# Patient Record
Sex: Female | Born: 1945
Health system: Southern US, Community
[De-identification: ages and names within clinical notes are randomized; demographics above are authoritative.]

## PROBLEM LIST (undated history)

## (undated) DIAGNOSIS — M810 Age-related osteoporosis without current pathological fracture: Secondary | ICD-10-CM

## (undated) DIAGNOSIS — S02401A Maxillary fracture, unspecified, initial encounter for closed fracture: Secondary | ICD-10-CM

## (undated) DIAGNOSIS — K219 Gastro-esophageal reflux disease without esophagitis: Secondary | ICD-10-CM

## (undated) DIAGNOSIS — N83209 Unspecified ovarian cyst, unspecified side: Secondary | ICD-10-CM

## (undated) DIAGNOSIS — N801 Endometriosis of ovary: Secondary | ICD-10-CM

## (undated) DIAGNOSIS — Z8742 Personal history of other diseases of the female genital tract: Secondary | ICD-10-CM

## (undated) DIAGNOSIS — G4730 Sleep apnea, unspecified: Secondary | ICD-10-CM

## (undated) DIAGNOSIS — M1990 Unspecified osteoarthritis, unspecified site: Secondary | ICD-10-CM

## (undated) DIAGNOSIS — F988 Other specified behavioral and emotional disorders with onset usually occurring in childhood and adolescence: Secondary | ICD-10-CM

## (undated) DIAGNOSIS — S32509A Unspecified fracture of unspecified pubis, initial encounter for closed fracture: Secondary | ICD-10-CM

## (undated) DIAGNOSIS — Z8679 Personal history of other diseases of the circulatory system: Secondary | ICD-10-CM

## (undated) DIAGNOSIS — S4292XA Fracture of left shoulder girdle, part unspecified, initial encounter for closed fracture: Secondary | ICD-10-CM

## (undated) DIAGNOSIS — I10 Essential (primary) hypertension: Secondary | ICD-10-CM

## (undated) DIAGNOSIS — M069 Rheumatoid arthritis, unspecified: Secondary | ICD-10-CM

## (undated) DIAGNOSIS — Z5189 Encounter for other specified aftercare: Secondary | ICD-10-CM

## (undated) DIAGNOSIS — H269 Unspecified cataract: Secondary | ICD-10-CM

## (undated) DIAGNOSIS — D649 Anemia, unspecified: Secondary | ICD-10-CM

## (undated) DIAGNOSIS — K2970 Gastritis, unspecified, without bleeding: Secondary | ICD-10-CM

## (undated) DIAGNOSIS — I319 Disease of pericardium, unspecified: Secondary | ICD-10-CM

## (undated) DIAGNOSIS — T7840XA Allergy, unspecified, initial encounter: Secondary | ICD-10-CM

## (undated) DIAGNOSIS — Z205 Contact with and (suspected) exposure to viral hepatitis: Secondary | ICD-10-CM

## (undated) DIAGNOSIS — N80109 Endometriosis of ovary, unspecified side, unspecified depth: Secondary | ICD-10-CM

## (undated) HISTORY — DX: Unspecified ovarian cyst, unspecified side: N83.209

## (undated) HISTORY — DX: Contact with and (suspected) exposure to viral hepatitis: Z20.5

## (undated) HISTORY — DX: Personal history of other diseases of the female genital tract: Z87.42

## (undated) HISTORY — DX: Anemia, unspecified: D64.9

## (undated) HISTORY — DX: Unspecified osteoarthritis, unspecified site: M19.90

## (undated) HISTORY — DX: Other specified behavioral and emotional disorders with onset usually occurring in childhood and adolescence: F98.8

## (undated) HISTORY — DX: Rheumatoid arthritis, unspecified: M06.9

## (undated) HISTORY — DX: Endometriosis of ovary, unspecified side, unspecified depth: N80.109

## (undated) HISTORY — DX: Disease of pericardium, unspecified: I31.9

## (undated) HISTORY — DX: Age-related osteoporosis without current pathological fracture: M81.0

## (undated) HISTORY — DX: Encounter for other specified aftercare: Z51.89

## (undated) HISTORY — DX: Sleep apnea, unspecified: G47.30

## (undated) HISTORY — DX: Fracture of left shoulder girdle, part unspecified, initial encounter for closed fracture: S42.92XA

## (undated) HISTORY — DX: Unspecified fracture of unspecified pubis, initial encounter for closed fracture: S32.509A

## (undated) HISTORY — PX: APPENDECTOMY: SHX54

## (undated) HISTORY — PX: ANTERIOR CRUCIATE LIGAMENT REPAIR: SHX115

## (undated) HISTORY — DX: Personal history of other diseases of the circulatory system: Z86.79

## (undated) HISTORY — DX: Maxillary fracture, unspecified side, initial encounter for closed fracture: S02.401A

## (undated) HISTORY — DX: Essential (primary) hypertension: I10

## (undated) HISTORY — PX: OOPHORECTOMY: SHX86

## (undated) HISTORY — DX: Unspecified cataract: H26.9

## (undated) HISTORY — DX: Endometriosis of ovary: N80.1

## (undated) HISTORY — DX: Allergy, unspecified, initial encounter: T78.40XA

## (undated) HISTORY — PX: ABDOMINAL HYSTERECTOMY: SHX81

## (undated) HISTORY — PX: CARPAL TUNNEL RELEASE: SHX101

## (undated) HISTORY — DX: Gastritis, unspecified, without bleeding: K29.70

## (undated) HISTORY — DX: Gastro-esophageal reflux disease without esophagitis: K21.9

---
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
## 1979-03-13 DIAGNOSIS — S4292XA Fracture of left shoulder girdle, part unspecified, initial encounter for closed fracture: Secondary | ICD-10-CM

## 1979-03-13 DIAGNOSIS — S32509A Unspecified fracture of unspecified pubis, initial encounter for closed fracture: Secondary | ICD-10-CM

## 1979-03-13 DIAGNOSIS — S02401A Maxillary fracture, unspecified, initial encounter for closed fracture: Secondary | ICD-10-CM

## 1979-03-13 HISTORY — DX: Unspecified fracture of unspecified pubis, initial encounter for closed fracture: S32.509A

## 1979-03-13 HISTORY — DX: Maxillary fracture, unspecified side, initial encounter for closed fracture: S02.401A

## 1979-03-13 HISTORY — DX: Fracture of left shoulder girdle, part unspecified, initial encounter for closed fracture: S42.92XA

## 1986-03-12 DIAGNOSIS — Z8679 Personal history of other diseases of the circulatory system: Secondary | ICD-10-CM

## 1986-03-12 HISTORY — DX: Personal history of other diseases of the circulatory system: Z86.79

## 1997-03-12 HISTORY — PX: ARTHROSCOPIC REPAIR ACL: SUR80

## 1998-03-12 DIAGNOSIS — M069 Rheumatoid arthritis, unspecified: Secondary | ICD-10-CM

## 1998-03-12 HISTORY — DX: Rheumatoid arthritis, unspecified: M06.9

## 2003-01-10 LAB — HM COLONOSCOPY

## 2003-03-13 HISTORY — PX: EYE SURGERY: SHX253

## 2004-03-12 HISTORY — PX: SEPTOPLASTY: SUR1290

## 2005-02-09 HISTORY — PX: OTHER SURGICAL HISTORY: SHX169

## 2005-03-01 ENCOUNTER — Ambulatory Visit: Payer: Self-pay | Admitting: Family Medicine

## 2005-03-08 ENCOUNTER — Ambulatory Visit: Payer: Self-pay | Admitting: Otolaryngology

## 2005-03-27 ENCOUNTER — Ambulatory Visit: Payer: Self-pay

## 2005-05-28 ENCOUNTER — Ambulatory Visit: Payer: Self-pay | Admitting: Pain Medicine

## 2005-06-05 ENCOUNTER — Encounter: Payer: Self-pay | Admitting: Pain Medicine

## 2005-06-10 ENCOUNTER — Encounter: Payer: Self-pay | Admitting: Pain Medicine

## 2005-06-18 ENCOUNTER — Ambulatory Visit: Payer: Self-pay | Admitting: Pain Medicine

## 2005-07-10 ENCOUNTER — Encounter: Payer: Self-pay | Admitting: Pain Medicine

## 2005-09-23 ENCOUNTER — Emergency Department: Payer: Self-pay | Admitting: Emergency Medicine

## 2006-05-07 ENCOUNTER — Ambulatory Visit: Payer: Self-pay | Admitting: Unknown Physician Specialty

## 2008-05-07 ENCOUNTER — Ambulatory Visit: Payer: Self-pay | Admitting: Internal Medicine

## 2008-09-02 ENCOUNTER — Ambulatory Visit: Payer: Self-pay | Admitting: Internal Medicine

## 2010-08-10 LAB — HM MAMMOGRAPHY

## 2010-09-21 ENCOUNTER — Ambulatory Visit: Payer: Self-pay | Admitting: Internal Medicine

## 2011-01-08 ENCOUNTER — Other Ambulatory Visit: Payer: Self-pay | Admitting: Internal Medicine

## 2011-01-08 NOTE — Telephone Encounter (Signed)
[phone] Pt called to get her addrell  20 mg 2 x day last refill end sept

## 2011-01-08 NOTE — Telephone Encounter (Signed)
The adderall cannot be refilled until Nov 3 You can phone it in but not to refill before nov 3

## 2011-01-08 NOTE — Telephone Encounter (Signed)
I called [facility] and asked when it was last refilled, the pharmacist stated Oct. 4, 2012.   Patient told me she had it refilled at the end of Sept.  Please advise.

## 2011-01-09 MED ORDER — AMPHETAMINE-DEXTROAMPHETAMINE 20 MG PO TABS: 20.0000 mg | ORAL_TABLET | Freq: Two times a day (BID) | ORAL | Status: DC

## 2011-01-09 NOTE — Telephone Encounter (Signed)
I have printed the refill for you to sign with a note stating do not refill until Nov 3

## 2011-01-18 ENCOUNTER — Encounter: Payer: Self-pay | Admitting: Internal Medicine

## 2011-01-19 ENCOUNTER — Ambulatory Visit: Payer: Self-pay | Admitting: Internal Medicine

## 2011-01-22 ENCOUNTER — Other Ambulatory Visit: Payer: Self-pay | Admitting: Internal Medicine

## 2011-01-23 MED ORDER — TRAMADOL HCL 50 MG PO TABS: 50.0000 mg | ORAL_TABLET | Freq: Two times a day (BID) | ORAL | Status: DC | PRN

## 2011-02-12 ENCOUNTER — Encounter: Payer: Self-pay | Admitting: Internal Medicine

## 2011-02-12 ENCOUNTER — Ambulatory Visit (INDEPENDENT_AMBULATORY_CARE_PROVIDER_SITE_OTHER): Payer: Medicare Other | Admitting: Internal Medicine

## 2011-02-12 ENCOUNTER — Ambulatory Visit: Payer: Self-pay | Admitting: Internal Medicine

## 2011-02-12 DIAGNOSIS — M1990 Unspecified osteoarthritis, unspecified site: Secondary | ICD-10-CM

## 2011-02-12 DIAGNOSIS — Z13820 Encounter for screening for osteoporosis: Secondary | ICD-10-CM

## 2011-02-12 DIAGNOSIS — F909 Attention-deficit hyperactivity disorder, unspecified type: Secondary | ICD-10-CM

## 2011-02-12 DIAGNOSIS — Z23 Encounter for immunization: Secondary | ICD-10-CM

## 2011-02-12 DIAGNOSIS — I10 Essential (primary) hypertension: Secondary | ICD-10-CM

## 2011-02-12 MED ORDER — AMPHETAMINE-DEXTROAMPHETAMINE 20 MG PO TABS: 20.0000 mg | ORAL_TABLET | Freq: Two times a day (BID) | ORAL | Status: DC

## 2011-02-12 MED ORDER — NABUMETONE 750 MG PO TABS: 750.0000 mg | ORAL_TABLET | Freq: Two times a day (BID) | ORAL | Status: DC

## 2011-02-12 MED ORDER — AMLODIPINE BESYLATE 2.5 MG PO TABS: 2.5000 mg | ORAL_TABLET | Freq: Every day | ORAL | Status: DC

## 2011-02-12 MED ORDER — HYDROCODONE-ACETAMINOPHEN 7.5-750 MG PO TABS: 1.0000 | ORAL_TABLET | Freq: Every evening | ORAL | Status: DC | PRN
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
## 2011-02-12 NOTE — Patient Instructions (Signed)
Stop the meloxicam and try nabumetone twice daily for your antiinflammatory  continue 2 tramadol up to 6 daily.  1 or 2 ES vicodin  Daily for severe pain .[name] Kitchen   DEXA to repeated.

## 2011-02-13 ENCOUNTER — Encounter: Payer: Self-pay | Admitting: Internal Medicine

## 2011-02-13 DIAGNOSIS — S32509A Unspecified fracture of unspecified pubis, initial encounter for closed fracture: Secondary | ICD-10-CM | POA: Insufficient documentation

## 2011-02-13 DIAGNOSIS — N801 Endometriosis of ovary: Secondary | ICD-10-CM | POA: Insufficient documentation

## 2011-02-13 DIAGNOSIS — N80109 Endometriosis of ovary, unspecified side, unspecified depth: Secondary | ICD-10-CM | POA: Insufficient documentation

## 2011-02-13 DIAGNOSIS — Z13820 Encounter for screening for osteoporosis: Secondary | ICD-10-CM | POA: Insufficient documentation

## 2011-02-13 DIAGNOSIS — Z8742 Personal history of other diseases of the female genital tract: Secondary | ICD-10-CM | POA: Insufficient documentation

## 2011-02-13 DIAGNOSIS — S02401A Maxillary fracture, unspecified, initial encounter for closed fracture: Secondary | ICD-10-CM | POA: Insufficient documentation

## 2011-02-13 DIAGNOSIS — F909 Attention-deficit hyperactivity disorder, unspecified type: Secondary | ICD-10-CM | POA: Insufficient documentation

## 2011-02-13 DIAGNOSIS — S4292XA Fracture of left shoulder girdle, part unspecified, initial encounter for closed fracture: Secondary | ICD-10-CM | POA: Insufficient documentation

## 2011-02-13 NOTE — Assessment & Plan Note (Signed)
Last DEXA was done in [place] 4 years ago.  She is 65 and at risk given lean frame, prior tobacco use.  DEXA ordered.

## 2011-02-13 NOTE — Assessment & Plan Note (Signed)
Secondary to remote history of traumatic fractures of pelvis, coccyx, shoulder and jaw.  She has incomplete relief of pain with current NSAIDs.  Trial of relafen and PM vicodin,  If pain escalates will repeat serologiesfor RA given patient's report of positive testing in 2000.

## 2011-02-13 NOTE — Assessment & Plan Note (Signed)
Managed with adderall, stable doses,  Refills for 3 months given.

## 2011-02-22 ENCOUNTER — Other Ambulatory Visit: Payer: Self-pay | Admitting: Internal Medicine

## 2011-02-22 MED ORDER — MONTELUKAST SODIUM 10 MG PO TABS: 10.0000 mg | ORAL_TABLET | Freq: Every day | ORAL | Status: DC

## 2011-02-23 ENCOUNTER — Other Ambulatory Visit: Payer: Self-pay | Admitting: Internal Medicine

## 2011-02-23 NOTE — Telephone Encounter (Signed)
Refills on all 5 authorized.  #3 refills each

## 2011-02-26 ENCOUNTER — Other Ambulatory Visit: Payer: Self-pay | Admitting: Internal Medicine

## 2011-02-26 MED ORDER — MELOXICAM 15 MG PO TABS: 15.0000 mg | ORAL_TABLET | Freq: Every day | ORAL | Status: DC

## 2011-02-26 NOTE — Telephone Encounter (Signed)
[phone] Pt called to check on her rx.  This was to be called in Friday  [facility] in [place] Pt went by to pick up rx and [facility] had not received them yesterday afternoon.  [facility] was going to request refill yesterday Please advise pt when this is called in

## 2011-02-27 MED ORDER — FUROSEMIDE 20 MG PO TABS: 20.0000 mg | ORAL_TABLET | Freq: Every day | ORAL | Status: DC | PRN

## 2011-02-27 MED ORDER — ZOLPIDEM TARTRATE 10 MG PO TABS: 10.0000 mg | ORAL_TABLET | Freq: Every evening | ORAL | Status: DC | PRN

## 2011-02-27 MED ORDER — TRAMADOL HCL 50 MG PO TABS
50.0000 mg | ORAL_TABLET | Freq: Two times a day (BID) | ORAL | Status: DC | PRN
Start: 2011-02-23 — End: 2011-12-24

## 2011-02-27 MED ORDER — BENZONATATE 200 MG PO CAPS: 200.0000 mg | ORAL_CAPSULE | Freq: Three times a day (TID) | ORAL | Status: DC | PRN

## 2011-02-27 MED ORDER — ALPRAZOLAM 0.5 MG PO TABS: 0.5000 mg | ORAL_TABLET | Freq: Every evening | ORAL | Status: DC | PRN

## 2011-02-27 NOTE — Telephone Encounter (Signed)
Left message asking patient to return my call if she needs something other than the meloxicam because I'm not sure what med she needs refilled. Other than the meloxicam which we have already filled.

## 2011-02-27 NOTE — Telephone Encounter (Signed)
Rx called to pharmacy. Patient notified. She also wanted me to let you know that the Relefen is going to cost her $40.00 and she can't afford it, she is asking if you would want her to try something else or go back to the meloxicam.

## 2011-02-28 NOTE — Telephone Encounter (Signed)
If the meloxicam was helpful before , go back to it  ,  If not,  We could try diclofenac 75 mg twice daily prn pain #60

## 2011-04-12 DIAGNOSIS — H2510 Age-related nuclear cataract, unspecified eye: Secondary | ICD-10-CM | POA: Diagnosis not present

## 2011-04-17 ENCOUNTER — Encounter: Payer: Self-pay | Admitting: Internal Medicine

## 2011-04-26 DIAGNOSIS — H18519 Endothelial corneal dystrophy, unspecified eye: Secondary | ICD-10-CM | POA: Diagnosis not present

## 2011-04-26 DIAGNOSIS — H2510 Age-related nuclear cataract, unspecified eye: Secondary | ICD-10-CM | POA: Diagnosis not present

## 2011-05-08 ENCOUNTER — Other Ambulatory Visit: Payer: Self-pay | Admitting: Internal Medicine

## 2011-05-08 MED ORDER — AMPHETAMINE-DEXTROAMPHETAMINE 20 MG PO TABS: 20.0000 mg | ORAL_TABLET | Freq: Two times a day (BID) | ORAL | Status: DC

## 2011-05-11 ENCOUNTER — Ambulatory Visit: Payer: Self-pay | Admitting: Internal Medicine

## 2011-05-11 ENCOUNTER — Telehealth: Payer: Self-pay | Admitting: Internal Medicine

## 2011-05-11 ENCOUNTER — Ambulatory Visit (INDEPENDENT_AMBULATORY_CARE_PROVIDER_SITE_OTHER): Payer: Medicare Other | Admitting: Internal Medicine

## 2011-05-11 ENCOUNTER — Encounter: Payer: Self-pay | Admitting: Internal Medicine

## 2011-05-11 VITALS — BP 116/72 | HR 80 | Temp 98.4°F | Resp 16 | Ht 63.5 in | Wt 116.5 lb

## 2011-05-11 DIAGNOSIS — J441 Chronic obstructive pulmonary disease with (acute) exacerbation: Secondary | ICD-10-CM

## 2011-05-11 DIAGNOSIS — J449 Chronic obstructive pulmonary disease, unspecified: Secondary | ICD-10-CM | POA: Diagnosis not present

## 2011-05-11 DIAGNOSIS — F17200 Nicotine dependence, unspecified, uncomplicated: Secondary | ICD-10-CM | POA: Diagnosis not present

## 2011-05-11 HISTORY — PX: CATARACT EXTRACTION: SUR2

## 2011-05-11 MED ORDER — PREDNISONE (PAK) 10 MG PO TABS: ORAL_TABLET | ORAL | Status: AC

## 2011-05-11 MED ORDER — AMOXICILLIN-POT CLAVULANATE 875-125 MG PO TABS: 1.0000 | ORAL_TABLET | Freq: Two times a day (BID) | ORAL | Status: AC

## 2011-05-11 MED ORDER — FLUCONAZOLE 150 MG PO TABS: 150.0000 mg | ORAL_TABLET | Freq: Every day | ORAL | Status: AC

## 2011-05-11 MED ORDER — GUAIFENESIN-CODEINE 100-10 MG/5ML PO SOLN: 10.0000 mL | Freq: Three times a day (TID) | ORAL | Status: AC | PRN

## 2011-05-11 MED ORDER — ALBUTEROL SULFATE HFA 108 (90 BASE) MCG/ACT IN AERS: 2.0000 | INHALATION_SPRAY | Freq: Four times a day (QID) | RESPIRATORY_TRACT | Status: DC | PRN

## 2011-05-11 NOTE — Progress Notes (Signed)
Subjective:    Patient ID: Sonya Davis, female    DOB: 1945/04/05, 66 y.o.   MRN: [id number]  HPI 66 yr old white female with history of prior tobacco abuse presents with acute on chronic cough.  Her cough started in December, was initially productive,  then became dry and hacking  For the past month. now productive again with yellow tinged sputum accompanied by malaise, tenderness over her maxillary sinuses.  Using robitussin and simply saline twice daily , prior trial of  steroid nasal spray have caused bleeding .  2nd issue is planning to have right eye cataract removed.  Past Medical History  Diagnosis Date  . Gastritis   . 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 Rheumatoid arthritis 2000    per patient by labs  . Ovarian cyst     right, endometriosis  . ADD (attention deficit disorder)   . Fracture of bone of left shoulder 1981  . Fracture closed, pubis 1981  . Fracture of maxilla 1981  . History of ovarian cyst   . Endometriosis of ovary     prior GYN [name]   Current Outpatient Prescriptions on File Prior to Visit  Medication Sig Dispense Refill  . ALPRAZolam (XANAX) 0.5 MG tablet Take 1 tablet (0.5 mg total) by mouth at bedtime as needed.  30 tablet  3  . amLODipine (NORVASC) 2.5 MG tablet Take 1 tablet (2.5 mg total) by mouth daily.  30 tablet  11  . amphetamine-dextroamphetamine (ADDERALL, 20MG ,) 20 MG tablet Take 1 tablet (20 mg total) by mouth 2 (two) times daily.  60 tablet  0  . benzonatate (TESSALON) 200 MG capsule Take 1 capsule (200 mg total) by mouth 3 (three) times daily as needed.  20 capsule  3  . calcium carbonate (OS-CAL) 600 MG TABS Take 600 mg by mouth 2 (two) times daily with a meal.        . cetirizine (ZYRTEC) 10 MG tablet Take 10 mg by mouth daily.        . cholecalciferol (VITAMIN D) 1000 UNITS tablet Take 1,000 Units by mouth daily.        Tery Sanfilippo Calcium (STOOL SOFTENER PO) Take  by mouth.        . furosemide (LASIX) 20 MG tablet Take 1 tablet (20 mg total) by mouth daily as needed.  30 tablet  3  . HYDROcodone-acetaminophen (VICODIN ES) 7.5-750 MG per tablet Take 1 tablet by mouth at bedtime as needed and may repeat dose one time if needed for pain.  60 tablet  3  . meloxicam (MOBIC) 15 MG tablet Take 1 tablet (15 mg total) by mouth daily.  30 tablet  6  . montelukast (SINGULAIR) 10 MG tablet Take 1 tablet (10 mg total) by mouth daily.  30 tablet  5  . traMADol (ULTRAM) 50 MG tablet Take 1 tablet (50 mg total) by mouth 2 (two) times daily as needed. Maximum dose= 8 tablets per day  60 tablet  3  . zolpidem (AMBIEN) 10 MG tablet Take 1 tablet (10 mg total) by mouth at bedtime as needed.  30 tablet  3     Review of Systems  Constitutional: Positive for activity change. Negative for fever, chills and unexpected weight change.  HENT: Positive for nosebleeds, congestion, rhinorrhea and postnasal drip. Negative for hearing loss, ear pain, sore throat, facial swelling, sneezing, mouth sores, trouble swallowing,  neck pain, neck stiffness, voice change, sinus pressure, tinnitus and ear discharge.   Eyes: Negative for pain, discharge, redness and visual disturbance.  Respiratory: Positive for choking and wheezing. Negative for cough, chest tightness, shortness of breath and stridor.   Cardiovascular: Negative for chest pain, palpitations and leg swelling.  Musculoskeletal: Negative for myalgias and arthralgias.  Skin: Negative for color change and rash.  Neurological: Negative for dizziness, weakness, light-headedness and headaches.  Hematological: Negative for adenopathy.       Objective:   Physical Exam  Constitutional: She is oriented to person, place, and time. She appears well-developed and well-nourished.  HENT:  Nose: Right sinus exhibits maxillary sinus tenderness. Left sinus exhibits maxillary sinus tenderness.  Mouth/Throat: Oropharynx is clear and moist.  Eyes:  EOM are normal. Pupils are equal, round, and reactive to light. No scleral icterus.  Neck: Normal range of motion. Neck supple. No JVD present. No thyromegaly present.  Cardiovascular: Normal rate, regular rhythm, normal heart sounds and intact distal pulses.   Pulmonary/Chest: Effort normal. She has wheezes.  Abdominal: Soft. Bowel sounds are normal. She exhibits no mass. There is no tenderness.  Musculoskeletal: Normal range of motion. She exhibits no edema.  Lymphadenopathy:    She has no cervical adenopathy.  Neurological: She is alert and oriented to person, place, and time.  Skin: Skin is warm and dry.  Psychiatric: She has a normal mood and affect.      Assessment & Plan:   COPD with acute exacerbation Mild.  She was given a nebulizer treatment in office with albuterol, which she tolerated well. Will treat with steroids,nchodilators, antibiotics.     Updated Medication List Outpatient Encounter Prescriptions as of 05/11/2011  Medication Sig Dispense Refill  . ALPRAZolam (XANAX) 0.5 MG tablet Take 1 tablet (0.5 mg total) by mouth at bedtime as needed.  30 tablet  3  . amLODipine (NORVASC) 2.5 MG tablet Take 1 tablet (2.5 mg total) by mouth daily.  30 tablet  11  . amphetamine-dextroamphetamine (ADDERALL, 20MG ,) 20 MG tablet Take 1 tablet (20 mg total) by mouth 2 (two) times daily.  60 tablet  0  . benzonatate (TESSALON) 200 MG capsule Take 1 capsule (200 mg total) by mouth 3 (three) times daily as needed.  20 capsule  3  . calcium carbonate (OS-CAL) 600 MG TABS Take 600 mg by mouth 2 (two) times daily with a meal.        . cetirizine (ZYRTEC) 10 MG tablet Take 10 mg by mouth daily.        . cholecalciferol (VITAMIN D) 1000 UNITS tablet Take 1,000 Units by mouth daily.        Tery Sanfilippo Calcium (STOOL SOFTENER PO) Take by mouth.        . furosemide (LASIX) 20 MG tablet Take 1 tablet (20 mg total) by mouth daily as needed.  30 tablet  3  . HYDROcodone-acetaminophen (VICODIN ES)  7.5-750 MG per tablet Take 1 tablet by mouth at bedtime as needed and may repeat dose one time if needed for pain.  60 tablet  3  . meloxicam (MOBIC) 15 MG tablet Take 1 tablet (15 mg total) by mouth daily.  30 tablet  6  . montelukast (SINGULAIR) 10 MG tablet Take 1 tablet (10 mg total) by mouth daily.  30 tablet  5  . traMADol (ULTRAM) 50 MG tablet Take 1 tablet (50 mg total) by mouth 2 (two) times daily as needed. Maximum dose= 8 tablets per day  60 tablet  3  . zolpidem (AMBIEN) 10 MG tablet Take 1 tablet (10 mg total) by mouth at bedtime as needed.  30 tablet  3  . albuterol (PROVENTIL HFA;VENTOLIN HFA) 108 (90 BASE) MCG/ACT inhaler Inhale 2 puffs into the lungs every 6 (six) hours as needed for wheezing.  1 Inhaler  0  . amoxicillin-clavulanate (AUGMENTIN) 875-125 MG per tablet Take 1 tablet by mouth 2 (two) times daily.  14 tablet  0  . fluconazole (DIFLUCAN) 150 MG tablet Take 1 tablet (150 mg total) by mouth daily.  2 tablet  0  . guaiFENesin-codeine 100-10 MG/5ML syrup Take 10 mLs by mouth 3 (three) times daily as needed for cough.  180 mL  0  . predniSONE (STERAPRED UNI-PAK) 10 MG tablet 6 tablets on Day 1 , then reduce by 1 tablet daily until gone  21 tablet  0  . DISCONTD: fluticasone (FLONASE) 50 MCG/ACT nasal spray Place 2 sprays into the nose daily.        [name] Kitchen DISCONTD: nabumetone (RELAFEN) 750 MG tablet Take 1 tablet (750 mg total) by mouth 2 (two) times daily.  60 tablet  5

## 2011-05-11 NOTE — Telephone Encounter (Signed)
[phone] PT CALLED CHECKING O HER XRAY RESULTS.

## 2011-05-11 NOTE — Patient Instructions (Signed)
I am treating your for a COPD exacerbation since your are wheezing.   Prednisone ,  Albuterol inhaler,  augmentin for infection including any lingering sinus infection/.  Fluconazole for the anticipated yeast infection  Continue using Simply Saline twice daily for the rest of the season to flush your sinuses  I have printed out a cough medicine with codeine in it if you need something stronger than robitussin

## 2011-05-11 NOTE — Telephone Encounter (Signed)
Patient notified of xray results, Per Dr. Darrick Huntsman there are no signs of pneumonia and xray did suggest copd which Dr. Darrick Huntsman is treating her for. Copy of xray sent to be scanned.

## 2011-05-13 ENCOUNTER — Encounter: Payer: Self-pay | Admitting: Internal Medicine

## 2011-05-13 DIAGNOSIS — J441 Chronic obstructive pulmonary disease with (acute) exacerbation: Secondary | ICD-10-CM | POA: Insufficient documentation

## 2011-05-13 NOTE — Assessment & Plan Note (Addendum)
Mild.  She was given a nebulizer treatment in office with albuterol, which she tolerated well. Will treat with steroids,nchodilators, antibiotics.

## 2011-05-21 ENCOUNTER — Encounter: Payer: Self-pay | Admitting: Internal Medicine

## 2011-05-23 ENCOUNTER — Ambulatory Visit: Payer: Self-pay | Admitting: Ophthalmology

## 2011-05-23 DIAGNOSIS — Z01810 Encounter for preprocedural cardiovascular examination: Secondary | ICD-10-CM | POA: Diagnosis not present

## 2011-05-23 DIAGNOSIS — I10 Essential (primary) hypertension: Secondary | ICD-10-CM | POA: Diagnosis not present

## 2011-05-23 DIAGNOSIS — Z01812 Encounter for preprocedural laboratory examination: Secondary | ICD-10-CM | POA: Diagnosis not present

## 2011-05-23 DIAGNOSIS — H2510 Age-related nuclear cataract, unspecified eye: Secondary | ICD-10-CM | POA: Diagnosis not present

## 2011-05-23 LAB — POTASSIUM: Potassium: 3.5 mmol/L (ref 3.5–5.1)

## 2011-05-31 ENCOUNTER — Encounter: Payer: Self-pay | Admitting: Internal Medicine

## 2011-05-31 ENCOUNTER — Telehealth: Payer: Self-pay | Admitting: Internal Medicine

## 2011-05-31 DIAGNOSIS — R9431 Abnormal electrocardiogram [ECG] [EKG]: Secondary | ICD-10-CM

## 2011-05-31 NOTE — Telephone Encounter (Signed)
Her preoperative EKG done prior to cataract surgery suggested possible RBBB (a conduction delay) .  We should repeat her EKk next month after her surgery and if the changes are persistent,. Refer to Cardiology for evaluation.

## 2011-05-31 NOTE — Assessment & Plan Note (Signed)
Her preoperative EKG done prior to cataract surgery suggested possible RBBB (a conduction delay) .  We will repeat here after her suegery and if persistent,. Refer to Cardiology for evaluation.

## 2011-06-01 NOTE — Telephone Encounter (Signed)
Patient notified. She will call back to get this scheduled.

## 2011-06-01 NOTE — Telephone Encounter (Signed)
Left message asking patient to return my call.

## 2011-06-04 ENCOUNTER — Ambulatory Visit: Payer: Self-pay | Admitting: Ophthalmology

## 2011-06-04 DIAGNOSIS — Z8619 Personal history of other infectious and parasitic diseases: Secondary | ICD-10-CM | POA: Diagnosis not present

## 2011-06-04 DIAGNOSIS — R059 Cough, unspecified: Secondary | ICD-10-CM | POA: Diagnosis not present

## 2011-06-04 DIAGNOSIS — K589 Irritable bowel syndrome without diarrhea: Secondary | ICD-10-CM | POA: Diagnosis not present

## 2011-06-04 DIAGNOSIS — K219 Gastro-esophageal reflux disease without esophagitis: Secondary | ICD-10-CM | POA: Diagnosis not present

## 2011-06-04 DIAGNOSIS — J449 Chronic obstructive pulmonary disease, unspecified: Secondary | ICD-10-CM | POA: Diagnosis not present

## 2011-06-04 DIAGNOSIS — H2510 Age-related nuclear cataract, unspecified eye: Secondary | ICD-10-CM | POA: Diagnosis not present

## 2011-06-04 DIAGNOSIS — H269 Unspecified cataract: Secondary | ICD-10-CM | POA: Diagnosis not present

## 2011-06-04 DIAGNOSIS — R002 Palpitations: Secondary | ICD-10-CM | POA: Diagnosis not present

## 2011-06-04 DIAGNOSIS — M899 Disorder of bone, unspecified: Secondary | ICD-10-CM | POA: Diagnosis not present

## 2011-06-04 DIAGNOSIS — M069 Rheumatoid arthritis, unspecified: Secondary | ICD-10-CM | POA: Diagnosis not present

## 2011-06-04 DIAGNOSIS — Z79899 Other long term (current) drug therapy: Secondary | ICD-10-CM | POA: Diagnosis not present

## 2011-06-04 DIAGNOSIS — K2970 Gastritis, unspecified, without bleeding: Secondary | ICD-10-CM | POA: Diagnosis not present

## 2011-06-04 DIAGNOSIS — F909 Attention-deficit hyperactivity disorder, unspecified type: Secondary | ICD-10-CM | POA: Diagnosis not present

## 2011-06-04 DIAGNOSIS — K449 Diaphragmatic hernia without obstruction or gangrene: Secondary | ICD-10-CM | POA: Diagnosis not present

## 2011-06-07 ENCOUNTER — Other Ambulatory Visit: Payer: Self-pay | Admitting: Internal Medicine

## 2011-06-07 MED ORDER — HYDROCODONE-ACETAMINOPHEN 7.5-750 MG PO TABS: 1.0000 | ORAL_TABLET | Freq: Every evening | ORAL | Status: DC | PRN

## 2011-06-12 ENCOUNTER — Encounter: Payer: Self-pay | Admitting: Internal Medicine

## 2011-07-02 ENCOUNTER — Other Ambulatory Visit: Payer: Self-pay | Admitting: Internal Medicine

## 2011-07-02 MED ORDER — ALPRAZOLAM 0.5 MG PO TABS: 0.5000 mg | ORAL_TABLET | Freq: Every evening | ORAL | Status: DC | PRN

## 2011-07-02 MED ORDER — FUROSEMIDE 20 MG PO TABS: 20.0000 mg | ORAL_TABLET | Freq: Every day | ORAL | Status: DC | PRN

## 2011-07-02 MED ORDER — ZOLPIDEM TARTRATE 10 MG PO TABS: 10.0000 mg | ORAL_TABLET | Freq: Every evening | ORAL | Status: DC | PRN

## 2011-07-02 MED ORDER — BENZONATATE 200 MG PO CAPS: 200.0000 mg | ORAL_CAPSULE | Freq: Three times a day (TID) | ORAL | Status: DC | PRN

## 2011-07-03 ENCOUNTER — Other Ambulatory Visit: Payer: Self-pay | Admitting: Internal Medicine

## 2011-08-10 ENCOUNTER — Telehealth: Payer: Self-pay | Admitting: Internal Medicine

## 2011-08-10 MED ORDER — AMPHETAMINE-DEXTROAMPHETAMINE 20 MG PO TABS: 20.0000 mg | ORAL_TABLET | Freq: Two times a day (BID) | ORAL | Status: DC

## 2011-08-10 NOTE — Telephone Encounter (Signed)
Refill on Adderall 20 mg. °

## 2011-09-18 ENCOUNTER — Other Ambulatory Visit: Payer: Self-pay | Admitting: Internal Medicine

## 2011-09-18 NOTE — Telephone Encounter (Signed)
Patient needs adderall Rx refilled. Pls contact pt when it's ready to pickup. Thanks

## 2011-09-19 MED ORDER — AMPHETAMINE-DEXTROAMPHETAMINE 20 MG PO TABS: 20.0000 mg | ORAL_TABLET | Freq: Two times a day (BID) | ORAL | Status: DC

## 2011-09-20 NOTE — Telephone Encounter (Signed)
Patient notified that rx is ready ° °

## 2011-10-10 DIAGNOSIS — M2010 Hallux valgus (acquired), unspecified foot: Secondary | ICD-10-CM | POA: Diagnosis not present

## 2011-10-10 DIAGNOSIS — L608 Other nail disorders: Secondary | ICD-10-CM | POA: Diagnosis not present

## 2011-10-18 ENCOUNTER — Telehealth: Payer: Self-pay | Admitting: Internal Medicine

## 2011-10-18 MED ORDER — AMPHETAMINE-DEXTROAMPHETAMINE 20 MG PO TABS: 20.0000 mg | ORAL_TABLET | Freq: Two times a day (BID) | ORAL | Status: DC

## 2011-10-18 NOTE — Telephone Encounter (Signed)
Pt called to get her addreall 20mg  refilled    will run out this weekend. Please advise pt when ready to pick up

## 2011-10-22 ENCOUNTER — Other Ambulatory Visit: Payer: Self-pay | Admitting: Internal Medicine

## 2011-10-22 MED ORDER — AMPHETAMINE-DEXTROAMPHETAMINE 20 MG PO TABS: 20.0000 mg | ORAL_TABLET | Freq: Two times a day (BID) | ORAL | Status: DC

## 2011-11-09 DIAGNOSIS — H532 Diplopia: Secondary | ICD-10-CM | POA: Diagnosis not present

## 2011-11-19 ENCOUNTER — Other Ambulatory Visit: Payer: Self-pay | Admitting: Internal Medicine

## 2011-11-19 NOTE — Telephone Encounter (Signed)
Patient needing refills

## 2011-11-20 DIAGNOSIS — H532 Diplopia: Secondary | ICD-10-CM | POA: Diagnosis not present

## 2011-11-21 MED ORDER — HYDROCODONE-ACETAMINOPHEN 7.5-750 MG PO TABS: 1.0000 | ORAL_TABLET | Freq: Every evening | ORAL | Status: DC | PRN

## 2011-11-21 MED ORDER — AMPHETAMINE-DEXTROAMPHETAMINE 20 MG PO TABS: 20.0000 mg | ORAL_TABLET | Freq: Two times a day (BID) | ORAL | Status: DC

## 2011-12-19 ENCOUNTER — Other Ambulatory Visit: Payer: Self-pay | Admitting: Internal Medicine

## 2011-12-19 NOTE — Telephone Encounter (Signed)
Pt is needing refill on Adderall. She needs  Furroscmide, Tramadol, and Alprazolam.

## 2011-12-21 NOTE — Telephone Encounter (Signed)
Pt is calling back about refills. She says she hasn't received a call yet. I told her i would send the note again to get her meds refilled.

## 2011-12-24 MED ORDER — FUROSEMIDE 20 MG PO TABS: 20.0000 mg | ORAL_TABLET | Freq: Every day | ORAL | Status: DC | PRN

## 2011-12-25 MED ORDER — TRAMADOL HCL 50 MG PO TABS: 50.0000 mg | ORAL_TABLET | Freq: Two times a day (BID) | ORAL | Status: DC | PRN

## 2011-12-25 MED ORDER — FUROSEMIDE 20 MG PO TABS: 20.0000 mg | ORAL_TABLET | Freq: Every day | ORAL | Status: DC | PRN

## 2011-12-25 MED ORDER — ALPRAZOLAM 0.5 MG PO TABS: 0.5000 mg | ORAL_TABLET | Freq: Every evening | ORAL | Status: DC | PRN

## 2011-12-25 MED ORDER — AMPHETAMINE-DEXTROAMPHETAMINE 20 MG PO TABS: 20.0000 mg | ORAL_TABLET | Freq: Two times a day (BID) | ORAL | Status: DC

## 2011-12-25 NOTE — Telephone Encounter (Signed)
4 meds refilled,   2 were controlled usbstance., other 2 sent

## 2011-12-25 NOTE — Telephone Encounter (Signed)
Patient was notified via phone to pick up Rx here at the office.

## 2012-01-10 ENCOUNTER — Encounter: Payer: Self-pay | Admitting: Internal Medicine

## 2012-01-10 ENCOUNTER — Ambulatory Visit (INDEPENDENT_AMBULATORY_CARE_PROVIDER_SITE_OTHER): Payer: Medicare Other | Admitting: Internal Medicine

## 2012-01-10 VITALS — BP 122/70 | HR 80 | Temp 98.3°F | Ht 63.5 in | Wt 119.8 lb

## 2012-01-10 DIAGNOSIS — E785 Hyperlipidemia, unspecified: Secondary | ICD-10-CM | POA: Diagnosis not present

## 2012-01-10 DIAGNOSIS — Z1239 Encounter for other screening for malignant neoplasm of breast: Secondary | ICD-10-CM

## 2012-01-10 DIAGNOSIS — Z13220 Encounter for screening for lipoid disorders: Secondary | ICD-10-CM

## 2012-01-10 DIAGNOSIS — Z13820 Encounter for screening for osteoporosis: Secondary | ICD-10-CM

## 2012-01-10 DIAGNOSIS — G4730 Sleep apnea, unspecified: Secondary | ICD-10-CM

## 2012-01-10 DIAGNOSIS — F909 Attention-deficit hyperactivity disorder, unspecified type: Secondary | ICD-10-CM

## 2012-01-10 DIAGNOSIS — G4733 Obstructive sleep apnea (adult) (pediatric): Secondary | ICD-10-CM

## 2012-01-10 DIAGNOSIS — Z0000 Encounter for general adult medical examination without abnormal findings: Secondary | ICD-10-CM | POA: Diagnosis not present

## 2012-01-10 DIAGNOSIS — Z8679 Personal history of other diseases of the circulatory system: Secondary | ICD-10-CM | POA: Insufficient documentation

## 2012-01-10 DIAGNOSIS — R9431 Abnormal electrocardiogram [ECG] [EKG]: Secondary | ICD-10-CM | POA: Diagnosis not present

## 2012-01-10 DIAGNOSIS — E559 Vitamin D deficiency, unspecified: Secondary | ICD-10-CM

## 2012-01-10 DIAGNOSIS — R5383 Other fatigue: Secondary | ICD-10-CM

## 2012-01-10 DIAGNOSIS — Z124 Encounter for screening for malignant neoplasm of cervix: Secondary | ICD-10-CM

## 2012-01-10 DIAGNOSIS — M159 Polyosteoarthritis, unspecified: Secondary | ICD-10-CM

## 2012-01-10 DIAGNOSIS — G4737 Central sleep apnea in conditions classified elsewhere: Secondary | ICD-10-CM

## 2012-01-10 DIAGNOSIS — R5381 Other malaise: Secondary | ICD-10-CM | POA: Diagnosis not present

## 2012-01-10 LAB — CBC WITH DIFFERENTIAL/PLATELET
Basophils Absolute: 0 10*3/uL (ref 0.0–0.1)
Eosinophils Relative: 2.3 % (ref 0.0–5.0)
HCT: 42.4 % (ref 36.0–46.0)
Lymphs Abs: 1.5 10*3/uL (ref 0.7–4.0)
MCV: 94.7 fl (ref 78.0–100.0)
Monocytes Absolute: 0.4 10*3/uL (ref 0.1–1.0)
Platelets: 266 10*3/uL (ref 150.0–400.0)
RDW: 13.4 % (ref 11.5–14.6)

## 2012-01-10 LAB — LDL CHOLESTEROL, DIRECT: Direct LDL: 141.2 mg/dL

## 2012-01-10 LAB — LIPID PANEL: Triglycerides: 107 mg/dL (ref 0.0–149.0)

## 2012-01-10 LAB — COMPREHENSIVE METABOLIC PANEL
Alkaline Phosphatase: 55 U/L (ref 39–117)
Glucose, Bld: 92 mg/dL (ref 70–99)
Sodium: 139 mEq/L (ref 135–145)
Total Bilirubin: 0.6 mg/dL (ref 0.3–1.2)
Total Protein: 7.2 g/dL (ref 6.0–8.3)

## 2012-01-10 LAB — MAGNESIUM: Magnesium: 2.2 mg/dL (ref 1.5–2.5)

## 2012-01-10 MED ORDER — AMPHETAMINE-DEXTROAMPHETAMINE 20 MG PO TABS: 20.0000 mg | ORAL_TABLET | Freq: Two times a day (BID) | ORAL | Status: DC

## 2012-01-10 MED ORDER — MELOXICAM 15 MG PO TABS: 15.0000 mg | ORAL_TABLET | Freq: Every day | ORAL | Status: AC

## 2012-01-10 NOTE — Assessment & Plan Note (Addendum)
We reviewed the EKG she had done in March. She has a RBBB likely due to her History of pericarditis and history of COPD.Sonya Davis  Asymptomatic.  No further workup at this time,
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
## 2012-01-11 ENCOUNTER — Other Ambulatory Visit: Payer: Self-pay

## 2012-01-11 MED ORDER — AMPHETAMINE-DEXTROAMPHETAMINE 20 MG PO TABS: 20.0000 mg | ORAL_TABLET | Freq: Two times a day (BID) | ORAL | Status: DC

## 2012-01-13 ENCOUNTER — Encounter: Payer: Self-pay | Admitting: Internal Medicine

## 2012-01-13 DIAGNOSIS — G4733 Obstructive sleep apnea (adult) (pediatric): Secondary | ICD-10-CM | POA: Insufficient documentation

## 2012-01-13 DIAGNOSIS — Z0000 Encounter for general adult medical examination without abnormal findings: Secondary | ICD-10-CM | POA: Insufficient documentation

## 2012-01-13 NOTE — Assessment & Plan Note (Signed)
3 months of refills on Adderall given today.

## 2012-01-13 NOTE — Assessment & Plan Note (Signed)
Routine pelvic and breast exam without Pap was done today.
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
## 2012-01-13 NOTE — Assessment & Plan Note (Addendum)
She has had no prior DEXA scan at least in the last 3 years. She has had multiple pelvic fractures which were traumatic but not osteoporotic. DEXA ordered.

## 2012-01-15 ENCOUNTER — Encounter: Payer: Self-pay | Admitting: *Deleted

## 2012-01-15 NOTE — Progress Notes (Signed)
Result letter mailed to patient's home address.

## 2012-01-30 ENCOUNTER — Telehealth: Payer: Self-pay | Admitting: Internal Medicine

## 2012-01-30 NOTE — Telephone Encounter (Signed)
Patient is calling about question on her labs she received in the mail from April Miller and she has questions about her medications with Bjorn Loser.

## 2012-01-31 MED ORDER — MOMETASONE FUROATE 50 MCG/ACT NA SUSP: NASAL | Status: DC

## 2012-01-31 MED ORDER — FEXOFENADINE HCL 180 MG PO TABS: 180.0000 mg | ORAL_TABLET | Freq: Every day | ORAL | Status: DC

## 2012-01-31 NOTE — Telephone Encounter (Signed)
Both sent to  [facility]

## 2012-01-31 NOTE — Telephone Encounter (Signed)
Returned patient call gave her clarification on her instructions. She wants to know if she can get a Rx for Allegra and nasal spray

## 2012-03-11 ENCOUNTER — Ambulatory Visit (INDEPENDENT_AMBULATORY_CARE_PROVIDER_SITE_OTHER): Payer: Medicare Other | Admitting: Internal Medicine

## 2012-03-11 ENCOUNTER — Encounter: Payer: Self-pay | Admitting: Internal Medicine

## 2012-03-11 VITALS — BP 143/89 | HR 85 | Temp 98.2°F | Ht 63.0 in | Wt 112.0 lb

## 2012-03-11 DIAGNOSIS — J209 Acute bronchitis, unspecified: Secondary | ICD-10-CM | POA: Diagnosis not present

## 2012-03-11 MED ORDER — LEVOFLOXACIN 500 MG PO TABS: 500.0000 mg | ORAL_TABLET | Freq: Every day | ORAL | Status: DC

## 2012-03-11 MED ORDER — FLUTICASONE PROPIONATE 50 MCG/ACT NA SUSP: 2.0000 | Freq: Every day | NASAL | Status: DC

## 2012-03-11 MED ORDER — HYDROCODONE-ACETAMINOPHEN 5-325 MG PO TABS: 1.0000 | ORAL_TABLET | Freq: Four times a day (QID) | ORAL | Status: DC | PRN

## 2012-03-11 MED ORDER — PREDNISONE (PAK) 10 MG PO TABS: ORAL_TABLET | ORAL | Status: DC

## 2012-03-11 NOTE — Patient Instructions (Addendum)
You have a sinusitis and bronchitis   .  I am prescribing an antibiotic (levaquin and prednisone taper)  To manage the infecti0n and the inflammation in your ear/sinuses.   I also advise use of the following OTC meds to help with your other symptoms.   Take generic OTC benadryl 25 mg every 8 hours for the drainage,  Sudafed PE  10 to 30 mg every 8 hours for the congestion, you may substitute Afrin nasal spray for the nighttime dose of sudafed PE  If needed to prevent insomnia.  flushes your sinuses twice daily with Simply Saline (do over the sink because if you do it right you will spit out globs of mucus)  Use vicodin for cough FOR THE COUGH.  continue Aleve for muscle aches

## 2012-03-11 NOTE — Progress Notes (Signed)
Patient ID: Sonya Davis, female   DOB: 28-Nov-1945, 66 y.o.   MRN: [id number] Patient Active Problem List  Diagnosis  . Osteoarthritis  . Hypertension  . Fracture of bone of left shoulder  . Fracture closed, pubis  . Fracture of maxilla  . History of ovarian cyst  . Endometriosis of ovary  . Attention deficit disorder of adult with hyperactivity  . Screening for osteoporosis  . COPD with acute exacerbation  . Abnormal finding on EKG  . History of pericarditis  . Routine general medical examination at a health care facility  . OSA (obstructive sleep apnea)  . Bronchitis, acute    Subjective:  CC:   Chief Complaint  Patient presents with  . Sinusitis  . Cough    HPI:   Sonya Davis a 66 y.o. female who presents  Lightly Productive cough started 5 days ago, which has become persistent and disruptive. The cough is  paroxysmal at times it causes her to heave.  100.0  Legs and shoulders aching.  No sick contacts.  Did not have the influenza  Vaccine bc of a prior bad reaction .  Frontal sinus pain ,  Bilateral ear pain,  Using alka seltzer cold and sinus and aleve  For body aches.   Past Medical History  Diagnosis Date  . Gastritis   . 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 Rheumatoid arthritis 2000    per patient by labs  . Ovarian cyst     right, endometriosis  . ADD (attention deficit disorder)   . Fracture of bone of left shoulder 1981  . Fracture closed, pubis 1981  . Fracture of maxilla 1981  . History of ovarian cyst   . Endometriosis of ovary     prior GYN Sonya Davis  . History of pericarditis 1988    occurred after flu shot, with pneumonia    Past Surgical History  Procedure Date  . Anterior cruciate ligament repair age 52    left  . Carpal tunnel release     bilateral surgeries  . Arthroscopic repair acl 1999    left knee  . Appendectomy     at age 24  . Abdominal hysterectomy    secondary to endometriosis  . Eye surgery 2005    left cataract   . Cataract extraction Mar 2013    right eye, [place]  . Septoplasty Dec 2006    Sonya Davis,          The following portions of the patient's history were reviewed and updated as appropriate: Allergies, current medications, and problem list.    Review of Systems:   12 Pt  review of systems was negative except those addressed in the HPI,     History   Social History  . Marital Status: Single    Spouse Name: N/A    Number of Children: N/A  . Years of Education: N/A   Occupational History  . Not on file.   Social History Main Topics  . Smoking status: Former Smoker    Types: Cigarettes    Quit date: 02/11/2001  . Smokeless tobacco: Never Used  . Alcohol Use: 2.5 oz/week    5 drink(s) per week  . Drug Use: No  . Sexually Active: Not on file   Other Topics Concern  . Not on file   Social History Narrative  . No narrative on file  Objective:  BP 143/89  Pulse 85  Temp 98.2 F (36.8 C) (Oral)  Ht 5\' 3"  (1.6 m)  Wt 112 lb (50.803 kg)  BMI 19.84 kg/m2  SpO2 90%  General appearance: alert, cooperative and appears stated age Ears: normal TM's and external ear canals both ears Sinuses: Maxillary sinus is tender to palpation. Septal deviation noted. Throat: lips, mucosa, and tongue normal; teeth and gums normal Neck: no adenopathy, no carotid bruit, supple, symmetrical, trachea midline and thyroid not enlarged, symmetric, no tenderness/mass/nodules Back: symmetric, no curvature. ROM normal. No CVA tenderness. Lungs: clear to auscultation anteriorly, occasional wheezes posteriorly with good air movement. Heart: regular rate and rhythm, S1, S2 normal, no murmur, click, rub or gallop Abdomen: soft, non-tender; bowel sounds normal; no masses,  no organomegaly Pulses: 2+ and symmetric Skin: Skin color, texture, turgor normal. No rashes or lesions Lymph nodes: Cervical, supraclavicular, and  axillary nodes normal.  Assessment and Plan:  Bronchitis, acute Secondary to viral illness with superimposed bacterial sinus infection. Levaquin and prednisone 60 taper prescribed. Continue over-the-counter decongestants and cough suppressants.   Updated Medication List Outpatient Encounter Prescriptions as of 03/11/2012  Medication Sig Dispense Refill  . albuterol (PROVENTIL HFA;VENTOLIN HFA) 108 (90 BASE) MCG/ACT inhaler Inhale 2 puffs into the lungs every 6 (six) hours as needed for wheezing.  1 Inhaler  0  . ALPRAZolam (XANAX) 0.5 MG tablet Take 1 tablet (0.5 mg total) by mouth at bedtime as needed.  30 tablet  3  . amLODipine (NORVASC) 2.5 MG tablet Take 1 tablet (2.5 mg total) by mouth daily.  30 tablet  11  . amphetamine-dextroamphetamine (ADDERALL) 20 MG tablet Take 1 tablet (20 mg total) by mouth 2 (two) times daily.  60 tablet  0  . benzonatate (TESSALON) 200 MG capsule Take 1 capsule (200 mg total) by mouth 3 (three) times daily as needed.  60 capsule  3  . calcium carbonate (OS-CAL) 600 MG TABS Take 600 mg by mouth 2 (two) times daily with a meal.        . cetirizine (ZYRTEC) 10 MG tablet Take 10 mg by mouth daily.        . cholecalciferol (VITAMIN D) 1000 UNITS tablet Take 1,000 Units by mouth daily.        Tery Sanfilippo Calcium (STOOL SOFTENER PO) Take by mouth.        . fexofenadine (ALLEGRA) 180 MG tablet Take 1 tablet (180 mg total) by mouth daily.  30 tablet  11  . fluticasone (FLONASE) 50 MCG/ACT nasal spray Place 2 sprays into the nose daily.  16 g  6  . furosemide (LASIX) 20 MG tablet Take 1 tablet (20 mg total) by mouth daily as needed.  30 tablet  3  . HYDROcodone-acetaminophen (NORCO/VICODIN) 5-325 MG per tablet Take 1 tablet by mouth every 6 (six) hours as needed for pain. Or cough  60 tablet  3  . levofloxacin (LEVAQUIN) 500 MG tablet Take 1 tablet (500 mg total) by mouth daily.  7 tablet  0  . meloxicam (MOBIC) 15 MG tablet Take 1 tablet (15 mg total) by mouth daily.   30 tablet  6  . predniSONE (STERAPRED UNI-PAK) 10 MG tablet 6 tablets on Day 1 , then reduce by 1 tablet daily until gone  21 tablet  0  . traMADol (ULTRAM) 50 MG tablet Take 1 tablet (50 mg total) by mouth 2 (two) times daily as needed. Maximum dose= 8 tablets per day  90 tablet  3  . zolpidem (AMBIEN) 10 MG tablet Take 1 tablet (10 mg total) by mouth at bedtime as needed.  30 tablet  3  . [DISCONTINUED] HYDROcodone-acetaminophen (VICODIN ES) 7.5-750 MG per tablet Take 1 tablet by mouth at bedtime as needed and may repeat dose one time if needed for pain.  60 tablet  3  . [DISCONTINUED] mometasone (NASONEX) 50 MCG/ACT nasal spray 2 sprays in each nostril daily  17 g  11     No orders of the defined types were placed in this encounter.    No Follow-up on file.

## 2012-03-12 DIAGNOSIS — J209 Acute bronchitis, unspecified: Secondary | ICD-10-CM | POA: Insufficient documentation

## 2012-03-12 NOTE — Assessment & Plan Note (Signed)
Secondary to viral illness with superimposed bacterial sinus infection. Levaquin and prednisone 60 taper prescribed. Continue over-the-counter decongestants and cough suppressants.

## 2012-04-23 ENCOUNTER — Other Ambulatory Visit: Payer: Self-pay | Admitting: *Deleted

## 2012-04-24 MED ORDER — ZOLPIDEM TARTRATE 10 MG PO TABS: 10.0000 mg | ORAL_TABLET | Freq: Every evening | ORAL | Status: DC | PRN

## 2012-04-24 MED ORDER — ALPRAZOLAM 0.5 MG PO TABS: 0.5000 mg | ORAL_TABLET | Freq: Every evening | ORAL | Status: DC | PRN

## 2012-04-24 NOTE — Telephone Encounter (Signed)
Ok to refill,rxs on Counsellor

## 2012-04-24 NOTE — Addendum Note (Signed)
Addended by: Sherlene Shams on: 04/24/2012 04:50 PM   Modules accepted: Orders

## 2012-04-26 NOTE — Telephone Encounter (Signed)
Ambien and alprazolam called to [facility].

## 2012-04-28 ENCOUNTER — Telehealth: Payer: Self-pay | Admitting: Internal Medicine

## 2012-04-28 NOTE — Telephone Encounter (Signed)
Ok to print out 3 months at a time.  i will sign

## 2012-04-28 NOTE — Telephone Encounter (Signed)
Pt needing her Adderall refilled.  Pt states she has to bring in the prescription each month and would like for it to have a longer time before needing refills.

## 2012-04-29 MED ORDER — AMPHETAMINE-DEXTROAMPHETAMINE 20 MG PO TABS: 20.0000 mg | ORAL_TABLET | Freq: Two times a day (BID) | ORAL | Status: DC

## 2012-04-29 NOTE — Telephone Encounter (Signed)
Med printed and faxed.  

## 2012-04-30 ENCOUNTER — Other Ambulatory Visit: Payer: Self-pay | Admitting: General Practice

## 2012-04-30 MED ORDER — AMPHETAMINE-DEXTROAMPHETAMINE 20 MG PO TABS: 20.0000 mg | ORAL_TABLET | Freq: Two times a day (BID) | ORAL | Status: DC

## 2012-06-17 DIAGNOSIS — Z961 Presence of intraocular lens: Secondary | ICD-10-CM | POA: Diagnosis not present

## 2012-06-17 DIAGNOSIS — H2510 Age-related nuclear cataract, unspecified eye: Secondary | ICD-10-CM | POA: Diagnosis not present

## 2012-07-22 ENCOUNTER — Telehealth: Payer: Self-pay | Admitting: Internal Medicine

## 2012-07-22 NOTE — Telephone Encounter (Signed)
Patient reports having enough adderall for one week please advise as to refill last Office visit 12/13.

## 2012-07-22 NOTE — Telephone Encounter (Signed)
Pt called to get her rx of addrell  90 supply Pt has enough for 1 week

## 2012-07-22 NOTE — Telephone Encounter (Signed)
She will need to wait until Dr. Darrick Huntsman returns. I will only write for controlled medications if pt seen every 3 months.

## 2012-07-22 NOTE — Telephone Encounter (Signed)
Patient states she has enough to last til next week when Dr. Darrick Huntsman returns.

## 2012-07-31 MED ORDER — AMPHETAMINE-DEXTROAMPHETAMINE 20 MG PO TABS: 20.0000 mg | ORAL_TABLET | Freq: Two times a day (BID) | ORAL | Status: DC

## 2012-07-31 NOTE — Telephone Encounter (Signed)
Called patient today and let her know i was forwarding this to you she can pick up tomorrow will be fine.

## 2012-07-31 NOTE — Telephone Encounter (Signed)
30 day supply refilled,  Needs appt prior to any more refills.

## 2012-08-01 NOTE — Telephone Encounter (Signed)
Patient notified and she will make an appointment for follow up

## 2012-09-03 ENCOUNTER — Encounter: Payer: Self-pay | Admitting: Internal Medicine

## 2012-09-03 ENCOUNTER — Ambulatory Visit (INDEPENDENT_AMBULATORY_CARE_PROVIDER_SITE_OTHER): Payer: Medicare Other | Admitting: Internal Medicine

## 2012-09-03 VITALS — BP 132/96 | HR 97 | Temp 98.3°F | Resp 14 | Wt 120.0 lb

## 2012-09-03 DIAGNOSIS — F909 Attention-deficit hyperactivity disorder, unspecified type: Secondary | ICD-10-CM

## 2012-09-03 DIAGNOSIS — L820 Inflamed seborrheic keratosis: Secondary | ICD-10-CM

## 2012-09-03 DIAGNOSIS — R030 Elevated blood-pressure reading, without diagnosis of hypertension: Secondary | ICD-10-CM | POA: Diagnosis not present

## 2012-09-03 DIAGNOSIS — IMO0001 Reserved for inherently not codable concepts without codable children: Secondary | ICD-10-CM | POA: Insufficient documentation

## 2012-09-03 LAB — COMPREHENSIVE METABOLIC PANEL
ALT: 19 U/L (ref 0–35)
AST: 24 U/L (ref 0–37)
Albumin: 4.7 g/dL (ref 3.5–5.2)
Alkaline Phosphatase: 67 U/L (ref 39–117)
Calcium: 10.4 mg/dL (ref 8.4–10.5)
Chloride: 98 mEq/L (ref 96–112)
Potassium: 4.2 mEq/L (ref 3.5–5.1)
Sodium: 136 mEq/L (ref 135–145)
Total Protein: 8 g/dL (ref 6.0–8.3)

## 2012-09-03 LAB — MICROALBUMIN / CREATININE URINE RATIO
Creatinine,U: 12.3 mg/dL
Microalb, Ur: 0.3 mg/dL (ref 0.0–1.9)

## 2012-09-03 MED ORDER — AMPHETAMINE-DEXTROAMPHETAMINE 20 MG PO TABS: 20.0000 mg | ORAL_TABLET | Freq: Two times a day (BID) | ORAL | Status: DC

## 2012-09-03 MED ORDER — ZOLPIDEM TARTRATE 10 MG PO TABS: 10.0000 mg | ORAL_TABLET | Freq: Every evening | ORAL | Status: DC | PRN

## 2012-09-03 MED ORDER — ZOSTER VACCINE LIVE 19400 UNT/0.65ML ~~LOC~~ SOLR: 0.6500 mL | Freq: Once | SUBCUTANEOUS | Status: DC

## 2012-09-03 MED ORDER — ALPRAZOLAM 0.5 MG PO TABS: 0.5000 mg | ORAL_TABLET | Freq: Every evening | ORAL | Status: DC | PRN

## 2012-09-03 MED ORDER — HYDROCODONE-ACETAMINOPHEN 5-325 MG PO TABS: 1.0000 | ORAL_TABLET | Freq: Four times a day (QID) | ORAL | Status: DC | PRN

## 2012-09-03 NOTE — Progress Notes (Signed)
Patient ID: Tyannah Sane, female   DOB: [birth year], 67 y.o.   MRN: [id number]  Patient Active Problem List   Diagnosis Date Noted  . Elevated BP 09/03/2012  . Bronchitis, acute 03/12/2012  . Routine general medical examination at a health care facility 01/13/2012  . OSA (obstructive sleep apnea) 01/13/2012  . History of pericarditis   . Abnormal finding on EKG 05/31/2011  . COPD with acute exacerbation 05/13/2011  . Attention deficit disorder of adult with hyperactivity 02/13/2011  . Screening for osteoporosis 02/13/2011  . Fracture closed, pubis   . Fracture of maxilla   . History of ovarian cyst   . Endometriosis of ovary   . Osteoarthritis 02/12/2011    Subjective:  CC:   Chief Complaint  Patient presents with  . Follow-up    medication follow up    HPI:   Aribelle Parkeris a 67 y.o. female who presents for follow  Up on ADD and anxiety.  She has had an unintentional  wt loss  Of 7 lbs since her last visit .  Denies loss of appetite, systemic symptoms of malaise.  Her anxiety and insomnia are well controlled.  No early refills request of either controlled substance.    Past Medical History  Diagnosis Date  . Gastritis   . 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 Rheumatoid arthritis(714.0) 2000    per patient by labs  . Ovarian cyst     right, endometriosis  . ADD (attention deficit disorder)   . Fracture of bone of left shoulder 1981  . Fracture closed, pubis 1981  . Fracture of maxilla 1981  . History of ovarian cyst   . Endometriosis of ovary     prior GYN [name]  . History of pericarditis 1988    occurred after flu shot, with pneumonia    Past Surgical History  Procedure Laterality Date  . Anterior cruciate ligament repair  age 47    left  . Carpal tunnel release      bilateral surgeries  . Arthroscopic repair acl  1999    left knee  . Appendectomy      at age 12  . Abdominal  hysterectomy      secondary to endometriosis  . Eye surgery  2005    left cataract   . Cataract extraction  Mar 2013    right eye, [place]  . Septoplasty  Dec 2006    [name],     The following portions of the patient's history were reviewed and updated as appropriate: Allergies, current medications, and problem list.    Review of Systems:   Patient denies headache, fevers, malaise, unintentional weight loss, skin rash, eye pain, sinus congestion and sinus pain, sore throat, dysphagia,  hemoptysis , cough, dyspnea, wheezing, chest pain, palpitations, orthopnea, edema, abdominal pain, nausea, melena, diarrhea, constipation, flank pain, dysuria, hematuria, urinary  Frequency, nocturia, numbness, tingling, seizures,  Focal weakness, Loss of consciousness,  Tremor, insomnia, depression, anxiety, and suicidal ideation.      History   Social History  . Marital Status: Single    Spouse Name: N/A    Number of Children: N/A  . Years of Education: N/A   Occupational History  . Not on file.   Social History Main Topics  . Smoking status: Former Smoker    Types: Cigarettes    Quit date: 02/11/2001  . Smokeless tobacco: Never Used  .  Alcohol Use: 2.5 oz/week    5 drink(s) per week  . Drug Use: No  . Sexually Active: Not on file   Other Topics Concern  . Not on file   Social History Narrative  . No narrative on file    Objective:  BP 132/96  Pulse 97  Temp(Src) 98.3 F (36.8 C) (Oral)  Resp 14  Wt 120 lb (54.432 kg)  BMI 21.26 kg/m2  SpO2 98%  General appearance: alert, cooperative and appears stated age Ears: normal TM's and external ear canals both ears Throat: lips, mucosa, and tongue normal; teeth and gums normal Neck: no adenopathy, no carotid bruit, supple, symmetrical, trachea midline and thyroid not enlarged, symmetric, no tenderness/mass/nodules Back: symmetric, no curvature. ROM normal. No CVA tenderness. Lungs: clear to auscultation  bilaterally Heart: regular rate and rhythm, S1, S2 normal, no murmur, click, rub or gallop Abdomen: soft, non-tender; bowel sounds normal; no masses,  no organomegaly Pulses: 2+ and symmetric Skin: Skin color, texture, turgor normal. No rashes or lesions Lymph nodes: Cervical, supraclavicular, and axillary nodes normal.  Assessment and Plan:  Elevated BP pateint asked to bring home bp machine in for calicbration with ours so we can interrret home readings.   Attention deficit disorder of adult with hyperactivity Managed with ritalin. Refills given    Updated Medication List Outpatient Encounter Prescriptions as of 09/03/2012  Medication Sig Dispense Refill  . ALPRAZolam (XANAX) 0.5 MG tablet Take 1 tablet (0.5 mg total) by mouth at bedtime as needed.  30 tablet  3  . amphetamine-dextroamphetamine (ADDERALL) 20 MG tablet Take 1 tablet (20 mg total) by mouth 2 (two) times daily.  60 tablet  0  . amphetamine-dextroamphetamine (ADDERALL) 20 MG tablet Take 1 tablet (20 mg total) by mouth 2 (two) times daily.  60 tablet  0  . amphetamine-dextroamphetamine (ADDERALL) 20 MG tablet Take 1 tablet (20 mg total) by mouth 2 (two) times daily.  60 tablet  0  . benzonatate (TESSALON) 200 MG capsule Take 1 capsule (200 mg total) by mouth 3 (three) times daily as needed.  60 capsule  3  . calcium carbonate (OS-CAL) 600 MG TABS Take 600 mg by mouth 2 (two) times daily with a meal.        . cholecalciferol (VITAMIN D) 1000 UNITS tablet Take 1,000 Units by mouth daily.        Tery Sanfilippo Calcium (STOOL SOFTENER PO) Take by mouth.        . fexofenadine (ALLEGRA) 180 MG tablet Take 1 tablet (180 mg total) by mouth daily.  30 tablet  11  . fluticasone (FLONASE) 50 MCG/ACT nasal spray Place 2 sprays into the nose daily.  16 g  6  . furosemide (LASIX) 20 MG tablet Take 1 tablet (20 mg total) by mouth daily as needed.  30 tablet  3  . HYDROcodone-acetaminophen (NORCO/VICODIN) 5-325 MG per tablet Take 1 tablet by  mouth every 6 (six) hours as needed for pain. Or cough  60 tablet  3  . meloxicam (MOBIC) 15 MG tablet Take 1 tablet (15 mg total) by mouth daily.  30 tablet  6  . traMADol (ULTRAM) 50 MG tablet Take 1 tablet (50 mg total) by mouth 2 (two) times daily as needed. Maximum dose= 8 tablets per day  90 tablet  3  . zolpidem (AMBIEN) 10 MG tablet Take 1 tablet (10 mg total) by mouth at bedtime as needed.  30 tablet  3  . [DISCONTINUED] ALPRAZolam (XANAX) 0.5  MG tablet Take 1 tablet (0.5 mg total) by mouth at bedtime as needed.  30 tablet  3  . [DISCONTINUED] amphetamine-dextroamphetamine (ADDERALL) 20 MG tablet Take 1 tablet (20 mg total) by mouth 2 (two) times daily.  60 tablet  0  . [DISCONTINUED] amphetamine-dextroamphetamine (ADDERALL) 20 MG tablet Take 1 tablet (20 mg total) by mouth 2 (two) times daily.  60 tablet  0  . [DISCONTINUED] amphetamine-dextroamphetamine (ADDERALL) 20 MG tablet Take 1 tablet (20 mg total) by mouth 2 (two) times daily.  60 tablet  0  . [DISCONTINUED] amphetamine-dextroamphetamine (ADDERALL) 20 MG tablet Take 1 tablet (20 mg total) by mouth 2 (two) times daily.  60 tablet  0  . [DISCONTINUED] HYDROcodone-acetaminophen (NORCO/VICODIN) 5-325 MG per tablet Take 1 tablet by mouth every 6 (six) hours as needed for pain. Or cough  60 tablet  3  . [DISCONTINUED] zolpidem (AMBIEN) 10 MG tablet Take 1 tablet (10 mg total) by mouth at bedtime as needed.  30 tablet  3  . albuterol (PROVENTIL HFA;VENTOLIN HFA) 108 (90 BASE) MCG/ACT inhaler Inhale 2 puffs into the lungs every 6 (six) hours as needed for wheezing.  1 Inhaler  0  . amLODipine (NORVASC) 2.5 MG tablet Take 1 tablet (2.5 mg total) by mouth daily.  30 tablet  11  . cetirizine (ZYRTEC) 10 MG tablet Take 10 mg by mouth daily.        [name] Kitchen levofloxacin (LEVAQUIN) 500 MG tablet Take 1 tablet (500 mg total) by mouth daily.  7 tablet  0  . predniSONE (STERAPRED UNI-PAK) 10 MG tablet 6 tablets on Day 1 , then reduce by 1 tablet daily until  gone  21 tablet  0  . zoster vaccine live, PF, (ZOSTAVAX) 62952 UNT/0.65ML injection Inject 19,400 Units into the skin once.  1 each  0   No facility-administered encounter medications on file as of 09/03/2012.     Orders Placed This Encounter  Procedures  . Microalbumin / creatinine urine ratio  . Comprehensive metabolic panel  . TSH  . Ambulatory referral to Dermatology    No Follow-up on file.

## 2012-09-03 NOTE — Patient Instructions (Addendum)
Please come by with your home bp machine so we can confirm its accuracy   If it is accurate we will  Use your home readings to determine if you need medication for blood pressure elevation   Mammogram and shingles vaccine

## 2012-09-04 ENCOUNTER — Encounter: Payer: Self-pay | Admitting: Internal Medicine

## 2012-09-04 NOTE — Assessment & Plan Note (Signed)
pateint asked to bring home bp machine in for calicbration with ours so we can interrret home readings.

## 2012-09-04 NOTE — Assessment & Plan Note (Signed)
Managed with ritalin. Refills given

## 2012-09-05 ENCOUNTER — Encounter: Payer: Self-pay | Admitting: *Deleted

## 2012-09-09 ENCOUNTER — Encounter: Payer: Self-pay | Admitting: Internal Medicine

## 2012-09-19 ENCOUNTER — Other Ambulatory Visit: Payer: Self-pay | Admitting: Internal Medicine

## 2012-09-19 NOTE — Telephone Encounter (Signed)
Okay to refill? 

## 2012-11-12 DIAGNOSIS — L821 Other seborrheic keratosis: Secondary | ICD-10-CM | POA: Diagnosis not present

## 2012-11-12 DIAGNOSIS — D235 Other benign neoplasm of skin of trunk: Secondary | ICD-10-CM | POA: Diagnosis not present

## 2012-12-03 DIAGNOSIS — Z961 Presence of intraocular lens: Secondary | ICD-10-CM | POA: Diagnosis not present

## 2012-12-03 DIAGNOSIS — H2510 Age-related nuclear cataract, unspecified eye: Secondary | ICD-10-CM | POA: Diagnosis not present

## 2012-12-04 ENCOUNTER — Telehealth: Payer: Self-pay | Admitting: Internal Medicine

## 2012-12-04 MED ORDER — AMPHETAMINE-DEXTROAMPHETAMINE 20 MG PO TABS: 20.0000 mg | ORAL_TABLET | Freq: Two times a day (BID) | ORAL | Status: DC

## 2012-12-04 NOTE — Telephone Encounter (Signed)
Refills on printer,  Needs to see chanel when she comes to pick them up

## 2012-12-04 NOTE — Telephone Encounter (Signed)
amphetamine-dextroamphetamine (ADDERALL) 20 MG tablet

## 2012-12-05 ENCOUNTER — Encounter: Payer: Self-pay | Admitting: *Deleted

## 2012-12-05 DIAGNOSIS — Z79899 Other long term (current) drug therapy: Secondary | ICD-10-CM | POA: Diagnosis not present

## 2012-12-05 NOTE — Telephone Encounter (Signed)
Pt notified Rx ready for pickup 

## 2012-12-29 ENCOUNTER — Encounter: Payer: Self-pay | Admitting: Internal Medicine

## 2013-01-07 ENCOUNTER — Other Ambulatory Visit: Payer: Self-pay | Admitting: Internal Medicine

## 2013-01-07 ENCOUNTER — Other Ambulatory Visit: Payer: Self-pay | Admitting: *Deleted

## 2013-01-15 ENCOUNTER — Other Ambulatory Visit: Payer: Self-pay

## 2013-01-19 ENCOUNTER — Ambulatory Visit (INDEPENDENT_AMBULATORY_CARE_PROVIDER_SITE_OTHER): Payer: Medicare Other | Admitting: Adult Health

## 2013-01-19 ENCOUNTER — Encounter: Payer: Self-pay | Admitting: Adult Health

## 2013-01-19 VITALS — BP 120/80 | HR 112 | Temp 98.5°F | Resp 16 | Wt 115.0 lb

## 2013-01-19 DIAGNOSIS — J329 Chronic sinusitis, unspecified: Secondary | ICD-10-CM | POA: Diagnosis not present

## 2013-01-19 DIAGNOSIS — J019 Acute sinusitis, unspecified: Secondary | ICD-10-CM | POA: Insufficient documentation

## 2013-01-19 MED ORDER — BENZONATATE 200 MG PO CAPS: 200.0000 mg | ORAL_CAPSULE | Freq: Three times a day (TID) | ORAL | Status: DC | PRN

## 2013-01-19 MED ORDER — FLUTICASONE PROPIONATE 50 MCG/ACT NA SUSP: 2.0000 | Freq: Every day | NASAL | Status: DC

## 2013-01-19 MED ORDER — ALPRAZOLAM 0.5 MG PO TABS: 0.5000 mg | ORAL_TABLET | Freq: Every evening | ORAL | Status: DC | PRN

## 2013-01-19 MED ORDER — HYDROCODONE-ACETAMINOPHEN 5-325 MG PO TABS: 1.0000 | ORAL_TABLET | Freq: Four times a day (QID) | ORAL | Status: DC | PRN

## 2013-01-19 MED ORDER — AZITHROMYCIN 250 MG PO TABS: ORAL_TABLET | ORAL | Status: DC

## 2013-01-19 NOTE — Progress Notes (Signed)
Subjective:    Patient ID: Sonya Davis, female    DOB: Mar 14, 1945, 67 y.o.   MRN: [id number]  HPI  Patient is a 67 year old female who presents to clinic with complaints of coughing, sinus congestion, yellow drainage that began on Saturday. She reports having a fever although she is currently afebrile. She denies shortness of breath or wheezing.  Patient is also requesting refills on her hydrocodone and Xanax.  Current Outpatient Prescriptions on File Prior to Visit  Medication Sig Dispense Refill  . amphetamine-dextroamphetamine (ADDERALL) 20 MG tablet Take 1 tablet (20 mg total) by mouth 2 (two) times daily.  60 tablet  0  . amphetamine-dextroamphetamine (ADDERALL) 20 MG tablet Take 1 tablet (20 mg total) by mouth 2 (two) times daily.  60 tablet  0  . amphetamine-dextroamphetamine (ADDERALL) 20 MG tablet Take 1 tablet (20 mg total) by mouth 2 (two) times daily.  60 tablet  0  . calcium carbonate (OS-CAL) 600 MG TABS Take 600 mg by mouth 2 (two) times daily with a meal.        . cetirizine (ZYRTEC) 10 MG tablet Take 10 mg by mouth daily.        . cholecalciferol (VITAMIN D) 1000 UNITS tablet Take 1,000 Units by mouth daily.        Tery Sanfilippo Calcium (STOOL SOFTENER PO) Take by mouth.        . fexofenadine (ALLEGRA) 180 MG tablet Take 1 tablet (180 mg total) by mouth daily.  30 tablet  11  . furosemide (LASIX) 20 MG tablet Take 1 tablet (20 mg total) by mouth daily as needed.  30 tablet  3  . furosemide (LASIX) 20 MG tablet TAKE ONE TABLET BY MOUTH DAILY AS NEEDED  30 tablet  2  . predniSONE (STERAPRED UNI-PAK) 10 MG tablet 6 tablets on Day 1 , then reduce by 1 tablet daily until gone  21 tablet  0  . traMADol (ULTRAM) 50 MG tablet TAKE 1 TABLET (50 MG TOTAL) BY MOUTH 2 (TWO) TIMES DAILY  AS NEEDED.MA XIMUM DOSE= 8  TABLETS PER DAY  90 tablet  5  . zolpidem (AMBIEN) 10 MG tablet Take 1 tablet (10 mg total) by mouth at bedtime as needed.  30 tablet  3  . zoster vaccine live, PF, (ZOSTAVAX)  19400 UNT/0.65ML injection Inject 19,400 Units into the skin once.  1 each  0  . albuterol (PROVENTIL HFA;VENTOLIN HFA) 108 (90 BASE) MCG/ACT inhaler Inhale 2 puffs into the lungs every 6 (six) hours as needed for wheezing.  1 Inhaler  0  . amLODipine (NORVASC) 2.5 MG tablet Take 1 tablet (2.5 mg total) by mouth daily.  30 tablet  11   No current facility-administered medications on file prior to visit.     Review of Systems  Constitutional: Negative for fever and chills.  HENT: Positive for congestion, ear pain, postnasal drip, rhinorrhea and sinus pressure. Negative for sore throat.   Respiratory: Positive for cough. Negative for shortness of breath.        Objective:   Physical Exam  Constitutional: She is oriented to person, place, and time. She appears well-developed and well-nourished.  Acutely ill  HENT:  Posterior pharynx drainage. No exudate  Cardiovascular: Normal rate, regular rhythm and normal heart sounds.   Pulmonary/Chest: Effort normal. No respiratory distress. She has wheezes.  Musculoskeletal: Normal range of motion. She exhibits no edema.  Neurological: She is alert and oriented to person, place, and time.  Skin: Skin is  warm and dry.  Psychiatric: She has a normal mood and affect. Her behavior is normal. Judgment and thought content normal.   BP 120/80  Pulse 112  Temp(Src) 98.5 F (36.9 C) (Oral)  Resp 16  Wt 115 lb (52.164 kg)  SpO2 96%        Assessment & Plan:

## 2013-01-19 NOTE — Assessment & Plan Note (Addendum)
Start azithromycin. Flonase nasal spray. Saline spray to irrigate sinuses. Tessalon Perles for cough. RTC if symptoms are not improved within 3-4 days or sooner if necessary.

## 2013-01-22 ENCOUNTER — Telehealth: Payer: Self-pay | Admitting: Internal Medicine

## 2013-01-22 ENCOUNTER — Other Ambulatory Visit: Payer: Self-pay | Admitting: Adult Health

## 2013-01-22 MED ORDER — AMOXICILLIN-POT CLAVULANATE 875-125 MG PO TABS: 1.0000 | ORAL_TABLET | Freq: Two times a day (BID) | ORAL | Status: DC

## 2013-01-22 MED ORDER — FLUCONAZOLE 150 MG PO TABS: 150.0000 mg | ORAL_TABLET | Freq: Once | ORAL | Status: DC

## 2013-01-22 NOTE — Telephone Encounter (Signed)
Patient seen on 01/19/13 in office, patient was advised to call back if no improvement in 3 to 4 days please advise?

## 2013-01-22 NOTE — Telephone Encounter (Signed)
Prescription for Augmentin sent to pharmacy - 1 tab twice a day for 10 days

## 2013-01-22 NOTE — Telephone Encounter (Signed)
The patient is still having symptoms of a sinus infection and she is wanting another antibiotics called into the pharmacy.

## 2013-01-22 NOTE — Telephone Encounter (Signed)
Still coughing and runny nose, no fever since Monday just still really congested and coughing , wants to know if she can get prednisone, please advise.

## 2013-01-22 NOTE — Progress Notes (Signed)
I sent in a prescription for augmentin bid x 10 days to [facility]

## 2013-01-22 NOTE — Telephone Encounter (Signed)
Pt notified. States she gets yeast infections with antibiotic use, Raquel notified, Diflucan sent to pharmacy as well. Pt aware.

## 2013-01-22 NOTE — Telephone Encounter (Signed)
Please find out what symptoms she continues to have - please try to get specific. SOB, wheezing? Also, please remind patient that we have patients in clinic and a response to her questions/concerns will be addressed is as timely a fashion as possible.

## 2013-01-22 NOTE — Telephone Encounter (Signed)
The patient has called back stating she called at 8 this morning and she should have received a call back by now.

## 2013-02-02 ENCOUNTER — Telehealth: Payer: Self-pay | Admitting: Internal Medicine

## 2013-02-02 NOTE — Telephone Encounter (Signed)
Patient Information:  Caller Name: Anjalina  Phone: [phone]  Patient: Sonya Davis, Sonya Davis  Gender: Female  DOB: [birth year]  Age: 67 Years  PCP: Duncan Dull (Adults only)  Office Follow Up:  Does the office need to follow up with this patient?: Yes  Instructions For The Office: No appts. available at [facility] office location. Patient declines appt. at another location. Patient is requesting medication to "dry me up." Patient uses [facility] in [place] at [phone]. Please return call to patient at [phone] regarding work in appt.  RN Note:  Patient states she was seen in the office 01/19/13 for cough, congestion, sinus pressure. Patient was diagnosed with Sinusitis. Patient states she was prescribed ZPack. Patient states she was not improving on the ZPack. Patient states she was prescribed Augmentin on 01/22/13. Patient states she completed the Augmentin 02/01/13. States she continues to have cough, hoarseness, head congestion. States she has clear nasal discharge. Denies wheezing or difficulty breathing. States, " My head feels full." Patient states she has mild swelling under her eyes, worse on the left. Patient states she continues to experience sinus pain/pressure. RN advised patient to be seen in the office. Care advice given per guidelines. Patient advised saline nasal washes, inhaled steam, humidifier, warm fluids with honey. No appts. available at [facility] office location. Patient declines appt. at another location. Patient is requesting medication to "dry me up." Patient uses [facility] in [place] at [phone]. Please return call to patient at [phone] regarding work in appt.  Symptoms  Reason For Call & Symptoms: Cough, congestion, sinus drainage  Reviewed Health History In EMR: Yes  Reviewed Medications In EMR: Yes  Reviewed Allergies In EMR: Yes  Reviewed Surgeries / Procedures: Yes  Date of Onset of Symptoms: 01/17/2013  Treatments Tried: ZPack,  Augmentin, Hydrocodone  Treatments Tried Worked: No  Guideline(s) Used:  Sinus Pain and Congestion  Disposition Per Guideline:   Go to Office Now  Reason For Disposition Reached:   Redness or swelling on the cheek, forehead, or around the eye  Advice Given:  Call Back If:   You become worse.  Hydration:  Drink plenty of liquids (6-8 glasses of water daily). If the air in your home is dry, use a cool mist humidifier  For a Stuffy Nose - Use Nasal Washes:  Introduction: Saline (salt water) nasal irrigation (nasal wash) is an effective and simple home remedy for treating stuffy nose and sinus congestion. The nose can be irrigated by pouring, spraying, or squirting salt water into the nose and then letting it run back out.  How it Helps: The salt water rinses out excess mucus, washes out any irritants (dust, allergens) that might be present, and moistens the nasal cavity.  Patient Refused Recommendation:  Patient Requests Prescription  No appts. available at [facility] office location. Patient declines appt. at another location. Patient is requesting medication to "dry me up." Patient uses [facility] in [place] at [phone]. Please return call to patient at [phone] regarding work in appt.

## 2013-02-02 NOTE — Telephone Encounter (Signed)
No appointment available to day please advise. Patient reporting lots of drainage up she reports no fever just lots of congestion scheduled patient for acute visit 02/03/13 with you.FYI

## 2013-02-02 NOTE — Telephone Encounter (Signed)
Patient notified

## 2013-02-02 NOTE — Telephone Encounter (Signed)
Symptoms are not urgent.  Tomorrow will do.  Have her try Benadryl 25 mg every 8 hours and sudafed PE  10 to 30 mg  every 8 hours

## 2013-02-03 ENCOUNTER — Encounter: Payer: Self-pay | Admitting: Internal Medicine

## 2013-02-03 ENCOUNTER — Ambulatory Visit (INDEPENDENT_AMBULATORY_CARE_PROVIDER_SITE_OTHER): Payer: Medicare Other | Admitting: Internal Medicine

## 2013-02-03 VITALS — BP 128/84 | HR 100 | Temp 98.5°F | Resp 14 | Ht 64.0 in | Wt 116.0 lb

## 2013-02-03 DIAGNOSIS — J329 Chronic sinusitis, unspecified: Secondary | ICD-10-CM

## 2013-02-03 DIAGNOSIS — J441 Chronic obstructive pulmonary disease with (acute) exacerbation: Secondary | ICD-10-CM | POA: Diagnosis not present

## 2013-02-03 MED ORDER — ALBUTEROL SULFATE HFA 108 (90 BASE) MCG/ACT IN AERS: 2.0000 | INHALATION_SPRAY | Freq: Four times a day (QID) | RESPIRATORY_TRACT | Status: DC | PRN

## 2013-02-03 MED ORDER — PREDNISONE (PAK) 10 MG PO TABS: ORAL_TABLET | ORAL | Status: DC

## 2013-02-03 MED ORDER — METHYLPREDNISOLONE ACETATE 40 MG/ML IJ SUSP
40.0000 mg | Freq: Once | INTRAMUSCULAR | Status: AC
  Administered 2013-02-03: 40 mg via INTRAMUSCULAR

## 2013-02-03 NOTE — Progress Notes (Signed)
Pre-visit discussion using our clinic review tool. No additional management support is needed unless otherwise documented below in the visit note.  

## 2013-02-03 NOTE — Patient Instructions (Addendum)
You have been treated adequately for a bacterial sinusitis but need to continue treating the inflammation and bronchitis    I am prescribing a prednisone taper  And an albuterol inhaler  To manage the inflammation in your ear/sinuses and lungs    I also advise use of the following OTC meds to help with your other symptoms.   Take generic OTC benadryl 25 mg every 8 hours for the drainage,  Sudafed PE  10 to 30 mg every 8 hours for the congestion, you may substitute Afrin nasal spray for the nighttime dose of sudafed PE  If needed to prevent insomnia.  Flush  your sinuses twice daily with Simply Saline (do over the sink because if you do it right you will spit out globs of mucus)  Use benzonatate capsules for daytime cough and vicodin at bedtime   Gargle with salt water as needed for sore throat.

## 2013-02-03 NOTE — Progress Notes (Signed)
Patient ID: Sonya Davis, female   DOB: 1945-11-02, 67 y.o.   MRN: [id number]  Patient Active Problem List   Diagnosis Date Noted  . Sinusitis 01/19/2013  . Elevated BP 09/03/2012  . Routine general medical examination at a health care facility 01/13/2012  . OSA (obstructive sleep apnea) 01/13/2012  . History of pericarditis   . Abnormal finding on EKG 05/31/2011  . Attention deficit disorder of adult with hyperactivity 02/13/2011  . Screening for osteoporosis 02/13/2011  . Fracture closed, pubis   . Fracture of maxilla   . History of ovarian cyst   . Endometriosis of ovary   . Osteoarthritis 02/12/2011    Subjective:  CC:   Chief Complaint  Patient presents with  . Cough    drainage and headache    HPI:   Sonya Parkeris a 67 y.o. female who presents with persistent malaise and fatigue, facial pain  and cough accompanied by Sinus congestion with initially purulent drainage, now clear.  Symptoms started around Nov 6th.   Treated on nov 10 by RR with z pak followed by 10 days of augmentin for persistent symptoms .  head feels full ,  Cheekbones hurt,  Still has a hacking cough,  Loses her voice and coughs with talking. Blowing out clear nasal drainage.   Currently using benadryl and the sudafed PE  For the last 12 hours  Stools have become soft,  Has had to stop her stool softener.    Past Medical History  Diagnosis Date  . Gastritis   . 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 Rheumatoid arthritis(714.0) 2000    per patient by labs  . Ovarian cyst     right, endometriosis  . ADD (attention deficit disorder)   . Fracture of bone of left shoulder 1981  . Fracture closed, pubis 1981  . Fracture of maxilla 1981  . History of ovarian cyst   . Endometriosis of ovary     prior GYN [name]  . History of pericarditis 1988    occurred after flu shot, with pneumonia    Past Surgical History  Procedure  Laterality Date  . Anterior cruciate ligament repair  age 30    left  . Carpal tunnel release      bilateral surgeries  . Arthroscopic repair acl  1999    left knee  . Appendectomy      at age 26  . Abdominal hysterectomy      secondary to endometriosis  . Eye surgery  2005    left cataract   . Cataract extraction  Mar 2013    right eye, Dingledein  . Septoplasty  Dec 2006    [name],        The following portions of the patient's history were reviewed and updated as appropriate: Allergies, current medications, and problem list.    Review of Systems:   12 Pt  review of systems was negative except those addressed in the HPI,     History   Social History  . Marital Status: Single    Spouse Name: N/A    Number of Children: N/A  . Years of Education: N/A   Occupational History  . Not on file.   Social History Main Topics  . Smoking status: Former Smoker    Types: Cigarettes    Quit date: 02/11/2001  . Smokeless tobacco: Never Used  . Alcohol Use: 2.5  oz/week    5 drink(s) per week  . Drug Use: No  . Sexual Activity: Not on file   Other Topics Concern  . Not on file   Social History Narrative  . No narrative on file    Objective:  Filed Vitals:   02/03/13 1103  BP: 128/84  Pulse: 100  Temp: 98.5 F (36.9 C)  Resp: 14     General appearance: alert, cooperative and appears stated age Ears: normal TM's and external ear canals both ears Throat: lips, mucosa, and tongue normal; teeth and gums normal Neck: no adenopathy, no carotid bruit, supple, symmetrical, trachea midline and thyroid not enlarged, symmetric, no tenderness/mass/nodules Back: symmetric, no curvature. ROM normal. No CVA tenderness. Lungs: clear to auscultation bilaterally Heart: regular rate and rhythm, S1, S2 normal, no murmur, click, rub or gallop Abdomen: soft, non-tender; bowel sounds normal; no masses,  no organomegaly Pulses: 2+ and symmetric Skin: Skin color,  texture, turgor normal. No rashes or lesions Lymph nodes: Cervical, supraclavicular, and axillary nodes normal.  Assessment and Plan:  Sinusitis Bacterial infection treated with 2 rounds of antibiotics.  No indication for additional abx prednisone taper for concurrent bronchitis and decongestants advised.  Probiotics advised Continue steroid nasal spray and inhaler.    Updated Medication List Outpatient Encounter Prescriptions as of 02/03/2013  Medication Sig  . ALPRAZolam (XANAX) 0.5 MG tablet Take 1 tablet (0.5 mg total) by mouth at bedtime as needed.  [name] Kitchen amphetamine-dextroamphetamine (ADDERALL) 20 MG tablet Take 1 tablet (20 mg total) by mouth 2 (two) times daily.  [name] Kitchen amphetamine-dextroamphetamine (ADDERALL) 20 MG tablet Take 1 tablet (20 mg total) by mouth 2 (two) times daily.  [name] Kitchen amphetamine-dextroamphetamine (ADDERALL) 20 MG tablet Take 1 tablet (20 mg total) by mouth 2 (two) times daily.  . benzonatate (TESSALON) 200 MG capsule Take 1 capsule (200 mg total) by mouth 3 (three) times daily as needed.  . calcium carbonate (OS-CAL) 600 MG TABS Take 600 mg by mouth 2 (two) times daily with a meal.    . cholecalciferol (VITAMIN D) 1000 UNITS tablet Take 1,000 Units by mouth daily.    Tery Sanfilippo Calcium (STOOL SOFTENER PO) Take by mouth.    . fexofenadine (ALLEGRA) 180 MG tablet Take 1 tablet (180 mg total) by mouth daily.  . fluconazole (DIFLUCAN) 150 MG tablet Take 1 tablet (150 mg total) by mouth once.  . fluticasone (FLONASE) 50 MCG/ACT nasal spray Place 2 sprays into both nostrils daily.  . furosemide (LASIX) 20 MG tablet Take 1 tablet (20 mg total) by mouth daily as needed.  . furosemide (LASIX) 20 MG tablet TAKE ONE TABLET BY MOUTH DAILY AS NEEDED  . HYDROcodone-acetaminophen (NORCO/VICODIN) 5-325 MG per tablet Take 1 tablet by mouth every 6 (six) hours as needed. Or cough  . traMADol (ULTRAM) 50 MG tablet TAKE 1 TABLET (50 MG TOTAL) BY MOUTH 2 (TWO) TIMES DAILY  AS NEEDED.MA XIMUM  DOSE= 8  TABLETS PER DAY  . zoster vaccine live, PF, (ZOSTAVAX) 16109 UNT/0.65ML injection Inject 19,400 Units into the skin once.  . [DISCONTINUED] amoxicillin-clavulanate (AUGMENTIN) 875-125 MG per tablet Take 1 tablet by mouth 2 (two) times daily.  [name] Kitchen albuterol (PROVENTIL HFA;VENTOLIN HFA) 108 (90 BASE) MCG/ACT inhaler Inhale 2 puffs into the lungs every 6 (six) hours as needed for wheezing or shortness of breath.  [name] Kitchen albuterol (PROVENTIL HFA;VENTOLIN HFA) 108 (90 BASE) MCG/ACT inhaler Inhale 2 puffs into the lungs every 6 (six) hours as needed for wheezing.  [name] Kitchen amLODipine (NORVASC)  2.5 MG tablet Take 1 tablet (2.5 mg total) by mouth daily.  . cetirizine (ZYRTEC) 10 MG tablet Take 10 mg by mouth daily.    . predniSONE (STERAPRED UNI-PAK) 10 MG tablet 6 tablets on Day 1 , then reduce by 1 tablet daily until gone  . zolpidem (AMBIEN) 10 MG tablet Take 1 tablet (10 mg total) by mouth at bedtime as needed.  . [DISCONTINUED] albuterol (PROVENTIL HFA;VENTOLIN HFA) 108 (90 BASE) MCG/ACT inhaler Inhale 2 puffs into the lungs every 6 (six) hours as needed for wheezing.  . [DISCONTINUED] azithromycin (ZITHROMAX) 250 MG tablet Take 2 tablets today then 1 daily for the next 4 days  . [DISCONTINUED] predniSONE (STERAPRED UNI-PAK) 10 MG tablet 6 tablets on Day 1 , then reduce by 1 tablet daily until gone  . [EXPIRED] methylPREDNISolone acetate (DEPO-MEDROL) injection 40 mg      No orders of the defined types were placed in this encounter.    No Follow-up on file.

## 2013-02-05 ENCOUNTER — Encounter: Payer: Self-pay | Admitting: Internal Medicine

## 2013-02-05 NOTE — Assessment & Plan Note (Addendum)
Bacterial infection treated with 2 rounds of antibiotics.  No indication for additional abx prednisone taper for concurrent bronchitis and decongestants advised.  Probiotics advised Continue steroid nasal spray and inhaler.

## 2013-02-20 ENCOUNTER — Other Ambulatory Visit: Payer: Self-pay | Admitting: *Deleted

## 2013-02-20 MED ORDER — ALPRAZOLAM 0.5 MG PO TABS: 0.5000 mg | ORAL_TABLET | Freq: Every evening | ORAL | Status: DC | PRN

## 2013-02-20 NOTE — Telephone Encounter (Signed)
Ok to refill 

## 2013-02-23 ENCOUNTER — Other Ambulatory Visit: Payer: Self-pay

## 2013-03-13 ENCOUNTER — Other Ambulatory Visit: Payer: Self-pay | Admitting: Internal Medicine

## 2013-03-13 ENCOUNTER — Other Ambulatory Visit: Payer: Self-pay | Admitting: Adult Health

## 2013-03-13 MED ORDER — HYDROCODONE-ACETAMINOPHEN 5-325 MG PO TABS: 1.0000 | ORAL_TABLET | Freq: Four times a day (QID) | ORAL | Status: DC | PRN

## 2013-03-13 MED ORDER — AMPHETAMINE-DEXTROAMPHETAMINE 20 MG PO TABS: 20.0000 mg | ORAL_TABLET | Freq: Two times a day (BID) | ORAL | Status: DC

## 2013-03-13 NOTE — Telephone Encounter (Signed)
Refill

## 2013-03-13 NOTE — Telephone Encounter (Signed)
Pt notified that Adderall & Vicodin Rx are ready for pick up

## 2013-03-16 ENCOUNTER — Ambulatory Visit: Payer: Medicare Other | Admitting: Internal Medicine

## 2013-03-20 ENCOUNTER — Ambulatory Visit: Payer: Medicare Other | Admitting: Internal Medicine

## 2013-03-30 ENCOUNTER — Ambulatory Visit (INDEPENDENT_AMBULATORY_CARE_PROVIDER_SITE_OTHER): Payer: Medicare Other | Admitting: Internal Medicine

## 2013-03-30 ENCOUNTER — Encounter: Payer: Self-pay | Admitting: Internal Medicine

## 2013-03-30 VITALS — BP 118/98 | HR 100 | Temp 98.4°F | Resp 16 | Wt 115.2 lb

## 2013-03-30 DIAGNOSIS — G4733 Obstructive sleep apnea (adult) (pediatric): Secondary | ICD-10-CM

## 2013-03-30 DIAGNOSIS — J3489 Other specified disorders of nose and nasal sinuses: Secondary | ICD-10-CM

## 2013-03-30 DIAGNOSIS — F909 Attention-deficit hyperactivity disorder, unspecified type: Secondary | ICD-10-CM

## 2013-03-30 DIAGNOSIS — R0981 Nasal congestion: Secondary | ICD-10-CM | POA: Insufficient documentation

## 2013-03-30 DIAGNOSIS — R0789 Other chest pain: Secondary | ICD-10-CM

## 2013-03-30 DIAGNOSIS — R071 Chest pain on breathing: Secondary | ICD-10-CM | POA: Diagnosis not present

## 2013-03-30 MED ORDER — HYDROCODONE-ACETAMINOPHEN 5-325 MG PO TABS: 1.0000 | ORAL_TABLET | Freq: Four times a day (QID) | ORAL | Status: DC | PRN

## 2013-03-30 MED ORDER — AZELASTINE-FLUTICASONE 137-50 MCG/ACT NA SUSP: 2.0000 | Freq: Every day | NASAL | Status: DC

## 2013-03-30 MED ORDER — PREDNISONE (PAK) 10 MG PO TABS: ORAL_TABLET | ORAL | Status: DC

## 2013-03-30 MED ORDER — AMPHETAMINE-DEXTROAMPHETAMINE 20 MG PO TABS: 20.0000 mg | ORAL_TABLET | Freq: Two times a day (BID) | ORAL | Status: DC

## 2013-03-30 NOTE — Patient Instructions (Addendum)
Trial of prednisone taper and dymista nasal spray for 2 weeks.  If no improvement in nasal drainage  Referral to [facility]   Please return for fasting labs ASAP  Prevnar vaccine today

## 2013-03-30 NOTE — Progress Notes (Signed)
Patient ID: Sonya Davis, female   DOB: 04-Jan-1946, 68 y.o.   MRN: [id number]   Patient Active Problem List   Diagnosis Date Noted  . Right-sided chest wall pain 03/31/2013  . Rhinorrhea 03/31/2013  . Nasal congestion with rhinorrhea 03/30/2013  . Elevated BP 09/03/2012  . Routine general medical examination at a health care facility 01/13/2012  . OSA (obstructive sleep apnea) 01/13/2012  . History of pericarditis   . Abnormal finding on EKG 05/31/2011  . Attention deficit disorder of adult with hyperactivity 02/13/2011  . Screening for osteoporosis 02/13/2011  . Fracture closed, pubis   . Fracture of maxilla   . History of ovarian cyst   . Endometriosis of ovary   . Osteoarthritis 02/12/2011    Subjective:  CC:   Chief Complaint  Patient presents with  . Follow-up    6 month would like to discuss prevnar, patient thinks she may have cracked some ribs X 10 days ago.    HPI:   Sonya Parkeris a 68 y.o. female who presents for Right-sided rib pain. Patient has a history of slipping on an icy patch of pigment on January 6. This occurred while out of town in [place]. She fell with her elbow tucked into her side, to avoid breaking her forearm. She thinks she may have cracked ribs on the right side but did not go for ER evaluation. There was no bruising on that side noted. She has  severe pain initially which has improved. She remains slightly tender under the right lateral breast .    Still having nasal drainage,  trying different otc antihistamines and using Flonase regularly. She is seen no difference. She does have a remote history of severe facial trauma requiring reconstruction. She has had nasal drainage since then. Her nasal drainage is clear. She has also been tested for allergies and is positive for dust mold, pine  no prior testing for CSF drainage despite head trauma..    Past Medical History  Diagnosis Date  . Gastritis   . 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 Rheumatoid arthritis(714.0) 2000    per patient by labs  . Ovarian cyst     right, endometriosis  . ADD (attention deficit disorder)   . Fracture of bone of left shoulder 1981  . Fracture closed, pubis 1981  . Fracture of maxilla 1981  . History of ovarian cyst   . Endometriosis of ovary     prior GYN [name]  . History of pericarditis 1988    occurred after flu shot, with pneumonia    Past Surgical History  Procedure Laterality Date  . Anterior cruciate ligament repair  age 48    left  . Carpal tunnel release      bilateral surgeries  . Arthroscopic repair acl  1999    left knee  . Appendectomy      at age 32  . Abdominal hysterectomy      secondary to endometriosis  . Eye surgery  2005    left cataract   . Cataract extraction  Mar 2013    right eye, Dingledein  . Septoplasty  Dec 2006    [name],        The following portions of the patient's history were reviewed and updated as appropriate: Allergies, current medications, and problem list.    Review of Systems:   12 Pt  review of  systems was negative except those addressed in the HPI,     History   Social History  . Marital Status: Single    Spouse Name: N/A    Number of Children: N/A  . Years of Education: N/A   Occupational History  . Not on file.   Social History Main Topics  . Smoking status: Former Smoker    Types: Cigarettes    Quit date: 02/11/2001  . Smokeless tobacco: Never Used  . Alcohol Use: 2.5 oz/week    5 drink(s) per week  . Drug Use: No  . Sexual Activity: Not on file   Other Topics Concern  . Not on file   Social History Narrative  . No narrative on file    Objective:  Filed Vitals:   03/30/13 1443  BP: 118/98  Pulse: 100  Temp: 98.4 F (36.9 C)  Resp: 16     General appearance: alert, cooperative and appears stated age Ears: normal TM's and external ear canals both ears Throat:  lips, mucosa, and tongue normal; teeth and gums normal Neck: no adenopathy, no carotid bruit, supple, symmetrical, trachea midline and thyroid not enlarged, symmetric, no tenderness/mass/nodules Back: symmetric, no curvature. ROM normal. No CVA tenderness. Lungs: clear to auscultation bilaterally Heart: regular rate and rhythm, S1, S2 normal, no murmur, click, rub or gallop Abdomen: soft, non-tender; bowel sounds normal; no masses,  no organomegaly Pulses: 2+ and symmetric Skin: Skin color, texture, turgor normal. No rashes or lesions Lymph nodes: Cervical, supraclavicular, and axillary nodes normal.  Assessment and Plan:  Right-sided chest wall pain She had blunt trauma after a fall several weeks ago after slipping on ice. The possibility of rib fracture was discussed. She does not want imaging as is without change our management. Continue use of Tylenol and nonsteroidal anti-inflammatories as needed. Deep breathing exercises recommended lung exam is normal today.  Rhinorrhea Persistent for years, with no appreciable improvement using Flonase and over-the-counter antihistamines trial of prednisone taper and diabetes to. If symptoms persist given her remote history of head trauma need to consider CSF rhinorrhea and this will be pursued with an ENT evaluation.  Attention deficit disorder of adult with hyperactivity Managed with low-dose amphetamine. There has been no escalation of use and no questionable behavior. Refills given today.  OSA (obstructive sleep apnea) Diagnosed many years ago, and not treated due to patient's intolerance of mask secondary to history of facial trauma. She has no signs of untreated sleep apnea on exam. No further workup at this time.   Updated Medication List Outpatient Encounter Prescriptions as of 03/30/2013  Medication Sig  . albuterol (PROVENTIL HFA;VENTOLIN HFA) 108 (90 BASE) MCG/ACT inhaler Inhale 2 puffs into the lungs every 6 (six) hours as needed for  wheezing or shortness of breath.  [name] Kitchen albuterol (PROVENTIL HFA;VENTOLIN HFA) 108 (90 BASE) MCG/ACT inhaler Inhale 2 puffs into the lungs every 6 (six) hours as needed for wheezing.  [name] Kitchen ALPRAZolam (XANAX) 0.5 MG tablet Take 1 tablet (0.5 mg total) by mouth at bedtime as needed.  [name] Kitchen amphetamine-dextroamphetamine (ADDERALL) 20 MG tablet Take 1 tablet (20 mg total) by mouth 2 (two) times daily.  . benzonatate (TESSALON) 200 MG capsule Take 1 capsule (200 mg total) by mouth 3 (three) times daily as needed.  . calcium carbonate (OS-CAL) 600 MG TABS Take 600 mg by mouth 2 (two) times daily with a meal.    . cholecalciferol (VITAMIN D) 1000 UNITS tablet Take 1,000 Units by mouth daily.    [name] Kitchen  Docusate Calcium (STOOL SOFTENER PO) Take by mouth.    . fexofenadine (ALLEGRA) 180 MG tablet Take 1 tablet (180 mg total) by mouth daily.  . fluticasone (FLONASE) 50 MCG/ACT nasal spray Place 2 sprays into both nostrils daily.  . furosemide (LASIX) 20 MG tablet Take 1 tablet (20 mg total) by mouth daily as needed.  [name] Kitchen HYDROcodone-acetaminophen (NORCO/VICODIN) 5-325 MG per tablet Take 1 tablet by mouth every 6 (six) hours as needed. Or cough  . traMADol (ULTRAM) 50 MG tablet TAKE 1 TABLET (50 MG TOTAL) BY MOUTH 2 (TWO) TIMES DAILY  AS NEEDED.MA XIMUM DOSE= 8  TABLETS PER DAY  . zolpidem (AMBIEN) 10 MG tablet Take 1 tablet (10 mg total) by mouth at bedtime as needed.  . zoster vaccine live, PF, (ZOSTAVAX) 40347 UNT/0.65ML injection Inject 19,400 Units into the skin once.  . [DISCONTINUED] amphetamine-dextroamphetamine (ADDERALL) 20 MG tablet Take 1 tablet (20 mg total) by mouth 2 (two) times daily.  . [DISCONTINUED] amphetamine-dextroamphetamine (ADDERALL) 20 MG tablet Take 1 tablet (20 mg total) by mouth 2 (two) times daily.  . [DISCONTINUED] amphetamine-dextroamphetamine (ADDERALL) 20 MG tablet Take 1 tablet (20 mg total) by mouth 2 (two) times daily.  . [DISCONTINUED] HYDROcodone-acetaminophen (NORCO/VICODIN) 5-325 MG per  tablet Take 1 tablet by mouth every 6 (six) hours as needed. Or cough  . [DISCONTINUED] HYDROcodone-acetaminophen (NORCO/VICODIN) 5-325 MG per tablet Take 1 tablet by mouth every 6 (six) hours as needed. Or cough  . amLODipine (NORVASC) 2.5 MG tablet Take 1 tablet (2.5 mg total) by mouth daily.  . Azelastine-Fluticasone (DYMISTA) 137-50 MCG/ACT SUSP Place 2 Squirts into the nose daily. In each nostril  . cetirizine (ZYRTEC) 10 MG tablet Take 10 mg by mouth daily.    . fluconazole (DIFLUCAN) 150 MG tablet Take 1 tablet (150 mg total) by mouth once.  . predniSONE (STERAPRED UNI-PAK) 10 MG tablet 6 tablets on Day 1 , then reduce by 1 tablet daily until gone  . [DISCONTINUED] amphetamine-dextroamphetamine (ADDERALL) 20 MG tablet Take 1 tablet (20 mg total) by mouth 2 (two) times daily.  . [DISCONTINUED] furosemide (LASIX) 20 MG tablet TAKE ONE TABLET BY MOUTH DAILY AS NEEDED  . [DISCONTINUED] predniSONE (STERAPRED UNI-PAK) 10 MG tablet 6 tablets on Day 1 , then reduce by 1 tablet daily until gone     No orders of the defined types were placed in this encounter.    No Follow-up on file.

## 2013-03-30 NOTE — Progress Notes (Signed)
Pre-visit discussion using our clinic review tool. No additional management support is needed unless otherwise documented below in the visit note.  

## 2013-03-31 DIAGNOSIS — R0789 Other chest pain: Secondary | ICD-10-CM | POA: Insufficient documentation

## 2013-03-31 DIAGNOSIS — J3489 Other specified disorders of nose and nasal sinuses: Secondary | ICD-10-CM | POA: Insufficient documentation

## 2013-03-31 NOTE — Assessment & Plan Note (Signed)
Persistent for years, with no appreciable improvement using Flonase and over-the-counter antihistamines trial of prednisone taper and diabetes to. If symptoms persist given her remote history of head trauma need to consider CSF rhinorrhea and this will be pursued with an ENT evaluation.

## 2013-03-31 NOTE — Assessment & Plan Note (Signed)
Managed with low-dose amphetamine. There has been no escalation of use and no questionable behavior. Refills given today.

## 2013-03-31 NOTE — Assessment & Plan Note (Signed)
Diagnosed many years ago, and not treated due to patient's intolerance of mask secondary to history of facial trauma. She has no signs of untreated sleep apnea on exam. No further workup at this time.

## 2013-03-31 NOTE — Assessment & Plan Note (Signed)
She had blunt trauma after a fall several weeks ago after slipping on ice. The possibility of rib fracture was discussed. She does not want imaging as is without change our management. Continue use of Tylenol and nonsteroidal anti-inflammatories as needed. Deep breathing exercises recommended lung exam is normal today.

## 2013-04-08 DIAGNOSIS — M545 Low back pain, unspecified: Secondary | ICD-10-CM | POA: Diagnosis not present

## 2013-04-08 DIAGNOSIS — M542 Cervicalgia: Secondary | ICD-10-CM | POA: Diagnosis not present

## 2013-04-08 DIAGNOSIS — M546 Pain in thoracic spine: Secondary | ICD-10-CM | POA: Diagnosis not present

## 2013-04-08 DIAGNOSIS — M5030 Other cervical disc degeneration, unspecified cervical region: Secondary | ICD-10-CM | POA: Diagnosis not present

## 2013-04-08 DIAGNOSIS — M5137 Other intervertebral disc degeneration, lumbosacral region: Secondary | ICD-10-CM | POA: Diagnosis not present

## 2013-04-08 DIAGNOSIS — M999 Biomechanical lesion, unspecified: Secondary | ICD-10-CM | POA: Diagnosis not present

## 2013-04-08 DIAGNOSIS — M9981 Other biomechanical lesions of cervical region: Secondary | ICD-10-CM | POA: Diagnosis not present

## 2013-04-10 DIAGNOSIS — M545 Low back pain, unspecified: Secondary | ICD-10-CM | POA: Diagnosis not present

## 2013-04-10 DIAGNOSIS — M542 Cervicalgia: Secondary | ICD-10-CM | POA: Diagnosis not present

## 2013-04-10 DIAGNOSIS — M999 Biomechanical lesion, unspecified: Secondary | ICD-10-CM | POA: Diagnosis not present

## 2013-04-10 DIAGNOSIS — M9981 Other biomechanical lesions of cervical region: Secondary | ICD-10-CM | POA: Diagnosis not present

## 2013-04-10 DIAGNOSIS — M546 Pain in thoracic spine: Secondary | ICD-10-CM | POA: Diagnosis not present

## 2013-04-10 DIAGNOSIS — M25559 Pain in unspecified hip: Secondary | ICD-10-CM | POA: Diagnosis not present

## 2013-04-10 DIAGNOSIS — M25519 Pain in unspecified shoulder: Secondary | ICD-10-CM | POA: Diagnosis not present

## 2013-04-13 DIAGNOSIS — M5030 Other cervical disc degeneration, unspecified cervical region: Secondary | ICD-10-CM | POA: Diagnosis not present

## 2013-04-13 DIAGNOSIS — M545 Low back pain, unspecified: Secondary | ICD-10-CM | POA: Diagnosis not present

## 2013-04-13 DIAGNOSIS — M5137 Other intervertebral disc degeneration, lumbosacral region: Secondary | ICD-10-CM | POA: Diagnosis not present

## 2013-04-13 DIAGNOSIS — M999 Biomechanical lesion, unspecified: Secondary | ICD-10-CM | POA: Diagnosis not present

## 2013-04-13 DIAGNOSIS — M9981 Other biomechanical lesions of cervical region: Secondary | ICD-10-CM | POA: Diagnosis not present

## 2013-04-13 DIAGNOSIS — M546 Pain in thoracic spine: Secondary | ICD-10-CM | POA: Diagnosis not present

## 2013-04-13 DIAGNOSIS — M542 Cervicalgia: Secondary | ICD-10-CM | POA: Diagnosis not present

## 2013-04-17 DIAGNOSIS — M542 Cervicalgia: Secondary | ICD-10-CM | POA: Diagnosis not present

## 2013-04-17 DIAGNOSIS — M546 Pain in thoracic spine: Secondary | ICD-10-CM | POA: Diagnosis not present

## 2013-04-17 DIAGNOSIS — M545 Low back pain, unspecified: Secondary | ICD-10-CM | POA: Diagnosis not present

## 2013-04-17 DIAGNOSIS — M999 Biomechanical lesion, unspecified: Secondary | ICD-10-CM | POA: Diagnosis not present

## 2013-04-17 DIAGNOSIS — M5030 Other cervical disc degeneration, unspecified cervical region: Secondary | ICD-10-CM | POA: Diagnosis not present

## 2013-04-17 DIAGNOSIS — M5137 Other intervertebral disc degeneration, lumbosacral region: Secondary | ICD-10-CM | POA: Diagnosis not present

## 2013-04-17 DIAGNOSIS — M9981 Other biomechanical lesions of cervical region: Secondary | ICD-10-CM | POA: Diagnosis not present

## 2013-04-20 DIAGNOSIS — M546 Pain in thoracic spine: Secondary | ICD-10-CM | POA: Diagnosis not present

## 2013-04-20 DIAGNOSIS — M9981 Other biomechanical lesions of cervical region: Secondary | ICD-10-CM | POA: Diagnosis not present

## 2013-04-20 DIAGNOSIS — M5137 Other intervertebral disc degeneration, lumbosacral region: Secondary | ICD-10-CM | POA: Diagnosis not present

## 2013-04-20 DIAGNOSIS — M545 Low back pain, unspecified: Secondary | ICD-10-CM | POA: Diagnosis not present

## 2013-04-20 DIAGNOSIS — M542 Cervicalgia: Secondary | ICD-10-CM | POA: Diagnosis not present

## 2013-04-20 DIAGNOSIS — M5030 Other cervical disc degeneration, unspecified cervical region: Secondary | ICD-10-CM | POA: Diagnosis not present

## 2013-04-20 DIAGNOSIS — M999 Biomechanical lesion, unspecified: Secondary | ICD-10-CM | POA: Diagnosis not present

## 2013-04-27 DIAGNOSIS — M999 Biomechanical lesion, unspecified: Secondary | ICD-10-CM | POA: Diagnosis not present

## 2013-04-27 DIAGNOSIS — M545 Low back pain, unspecified: Secondary | ICD-10-CM | POA: Diagnosis not present

## 2013-04-27 DIAGNOSIS — M542 Cervicalgia: Secondary | ICD-10-CM | POA: Diagnosis not present

## 2013-04-27 DIAGNOSIS — M5137 Other intervertebral disc degeneration, lumbosacral region: Secondary | ICD-10-CM | POA: Diagnosis not present

## 2013-04-27 DIAGNOSIS — M546 Pain in thoracic spine: Secondary | ICD-10-CM | POA: Diagnosis not present

## 2013-04-27 DIAGNOSIS — M5030 Other cervical disc degeneration, unspecified cervical region: Secondary | ICD-10-CM | POA: Diagnosis not present

## 2013-04-27 DIAGNOSIS — M9981 Other biomechanical lesions of cervical region: Secondary | ICD-10-CM | POA: Diagnosis not present

## 2013-05-11 ENCOUNTER — Other Ambulatory Visit: Payer: Self-pay | Admitting: *Deleted

## 2013-05-11 NOTE — Telephone Encounter (Signed)
Okay to refill? 

## 2013-05-12 MED ORDER — TRAMADOL HCL 50 MG PO TABS: ORAL_TABLET | ORAL | Status: DC

## 2013-05-12 NOTE — Telephone Encounter (Signed)
Rx faxed to pharmacy  

## 2013-05-12 NOTE — Telephone Encounter (Signed)
Ok to refill,  Authorized in epic 

## 2013-06-16 ENCOUNTER — Telehealth: Payer: Self-pay | Admitting: Internal Medicine

## 2013-06-16 MED ORDER — HYDROCODONE-ACETAMINOPHEN 5-325 MG PO TABS: 1.0000 | ORAL_TABLET | Freq: Four times a day (QID) | ORAL | Status: DC | PRN

## 2013-06-16 MED ORDER — AMPHETAMINE-DEXTROAMPHETAMINE 20 MG PO TABS: 20.0000 mg | ORAL_TABLET | Freq: Two times a day (BID) | ORAL | Status: DC

## 2013-06-16 NOTE — Telephone Encounter (Signed)
Patient advised to pick up rxs on Wednesday

## 2013-06-17 NOTE — Telephone Encounter (Signed)
Called patient and advised patient medications ready for pick up.

## 2013-07-15 ENCOUNTER — Telehealth: Payer: Self-pay | Admitting: Internal Medicine

## 2013-07-15 NOTE — Telephone Encounter (Signed)
amphetamine-dextroamphetamine (ADDERALL) 20 MG tablet   HYDROcodone-acetaminophen (NORCO/VICODIN) 5-325 MG per tablet

## 2013-07-15 NOTE — Telephone Encounter (Signed)
Ok  To fill?

## 2013-07-16 MED ORDER — HYDROCODONE-ACETAMINOPHEN 5-325 MG PO TABS: 1.0000 | ORAL_TABLET | Freq: Four times a day (QID) | ORAL | Status: DC | PRN

## 2013-07-16 MED ORDER — AMPHETAMINE-DEXTROAMPHETAMINE 20 MG PO TABS: 20.0000 mg | ORAL_TABLET | Freq: Two times a day (BID) | ORAL | Status: DC

## 2013-07-16 NOTE — Telephone Encounter (Signed)
Needs appt in may/juen ,  Refill one month given

## 2013-07-16 NOTE — Telephone Encounter (Signed)
Patient notified and aware needs appointment for further refills. Script placed up front.

## 2013-08-07 ENCOUNTER — Ambulatory Visit (INDEPENDENT_AMBULATORY_CARE_PROVIDER_SITE_OTHER): Payer: Medicare Other | Admitting: Internal Medicine

## 2013-08-07 ENCOUNTER — Encounter: Payer: Self-pay | Admitting: Internal Medicine

## 2013-08-07 VITALS — BP 112/86 | HR 80 | Temp 98.1°F | Resp 16 | Ht 63.0 in | Wt 114.8 lb

## 2013-08-07 DIAGNOSIS — E038 Other specified hypothyroidism: Secondary | ICD-10-CM | POA: Diagnosis not present

## 2013-08-07 DIAGNOSIS — M1990 Unspecified osteoarthritis, unspecified site: Secondary | ICD-10-CM

## 2013-08-07 DIAGNOSIS — J3489 Other specified disorders of nose and nasal sinuses: Secondary | ICD-10-CM

## 2013-08-07 DIAGNOSIS — R059 Cough, unspecified: Secondary | ICD-10-CM

## 2013-08-07 DIAGNOSIS — R05 Cough: Secondary | ICD-10-CM | POA: Diagnosis not present

## 2013-08-07 DIAGNOSIS — R053 Chronic cough: Secondary | ICD-10-CM

## 2013-08-07 DIAGNOSIS — E785 Hyperlipidemia, unspecified: Secondary | ICD-10-CM | POA: Diagnosis not present

## 2013-08-07 DIAGNOSIS — J441 Chronic obstructive pulmonary disease with (acute) exacerbation: Secondary | ICD-10-CM

## 2013-08-07 DIAGNOSIS — R0981 Nasal congestion: Secondary | ICD-10-CM

## 2013-08-07 DIAGNOSIS — F909 Attention-deficit hyperactivity disorder, unspecified type: Secondary | ICD-10-CM

## 2013-08-07 DIAGNOSIS — Z79899 Other long term (current) drug therapy: Secondary | ICD-10-CM | POA: Diagnosis not present

## 2013-08-07 DIAGNOSIS — E039 Hypothyroidism, unspecified: Secondary | ICD-10-CM

## 2013-08-07 LAB — COMPREHENSIVE METABOLIC PANEL
ALK PHOS: 66 U/L (ref 39–117)
ALT: 19 U/L (ref 0–35)
AST: 28 U/L (ref 0–37)
Albumin: 4.2 g/dL (ref 3.5–5.2)
BUN: 14 mg/dL (ref 6–23)
CO2: 27 mEq/L (ref 19–32)
CREATININE: 1 mg/dL (ref 0.4–1.2)
Calcium: 9.5 mg/dL (ref 8.4–10.5)
Chloride: 104 mEq/L (ref 96–112)
GFR: 62.17 mL/min (ref >60.00)
Glucose, Bld: 92 mg/dL (ref 70–99)
Potassium: 3.5 mEq/L (ref 3.5–5.1)
Sodium: 140 mEq/L (ref 135–145)
Total Bilirubin: 0.4 mg/dL (ref 0.2–1.2)
Total Protein: 7 g/dL (ref 6.0–8.3)

## 2013-08-07 LAB — LIPID PANEL
CHOLESTEROL: 242 mg/dL — AB (ref 0–200)
HDL: 84.7 mg/dL (ref >39.00)
LDL Cholesterol: 144 mg/dL — ABNORMAL HIGH (ref 0–99)
Total CHOL/HDL Ratio: 3
Triglycerides: 66 mg/dL (ref 0.0–149.0)
VLDL: 13.2 mg/dL (ref 0.0–40.0)

## 2013-08-07 LAB — TSH: TSH: 0.63 u[IU]/mL (ref 0.35–4.50)

## 2013-08-07 MED ORDER — ALPRAZOLAM 0.5 MG PO TABS: 0.5000 mg | ORAL_TABLET | Freq: Every evening | ORAL | Status: DC | PRN

## 2013-08-07 MED ORDER — BENZONATATE 200 MG PO CAPS: 200.0000 mg | ORAL_CAPSULE | Freq: Three times a day (TID) | ORAL | Status: DC | PRN

## 2013-08-07 MED ORDER — AMPHETAMINE-DEXTROAMPHETAMINE 20 MG PO TABS: 20.0000 mg | ORAL_TABLET | Freq: Two times a day (BID) | ORAL | Status: DC

## 2013-08-07 MED ORDER — HYDROCODONE-ACETAMINOPHEN 5-325 MG PO TABS: 1.0000 | ORAL_TABLET | Freq: Four times a day (QID) | ORAL | Status: DC | PRN

## 2013-08-07 MED ORDER — IPRATROPIUM BROMIDE 0.06 % NA SOLN: 2.0000 | Freq: Four times a day (QID) | NASAL | Status: DC

## 2013-08-07 MED ORDER — TRAMADOL HCL 50 MG PO TABS: ORAL_TABLET | ORAL | Status: DC

## 2013-08-07 NOTE — Patient Instructions (Addendum)
Trial of atrovent nasal spray to help dry up the nose  Pulmonary function tests to determine if the nebulizers will help (and will be covered)  Have been ordered   Fasting labs today

## 2013-08-07 NOTE — Progress Notes (Signed)
Pre-visit discussion using our clinic review tool. No additional management support is needed unless otherwise documented below in the visit note.  

## 2013-08-07 NOTE — Progress Notes (Signed)
Patient ID: Sonya Davis, female   DOB: [birth year], 68 y.o.   MRN: [id number]  Patient Active Problem List   Diagnosis Date Noted  . Right-sided chest wall pain 03/31/2013  . Rhinorrhea 03/31/2013  . Nasal congestion with rhinorrhea 03/30/2013  . Elevated BP 09/03/2012  . Routine general medical examination at a health care facility 01/13/2012  . OSA (obstructive sleep apnea) 01/13/2012  . History of pericarditis   . Abnormal finding on EKG 05/31/2011  . Attention deficit disorder of adult with hyperactivity 02/13/2011  . Screening for osteoporosis 02/13/2011  . Fracture closed, pubis   . Fracture of maxilla   . History of ovarian cyst   . Endometriosis of ovary   . Osteoarthritis 02/12/2011    Subjective:  CC:   Chief Complaint  Patient presents with  . Follow-up    medication refills and would like to discuss nebulizer.    HPI:   Sonya Davis is a 68 y.o. female who presents for  Follow up on rhinitis, ADD, and OSA .  Rhinitis improved in January with treatment but for the last several weeks has  been having more trouble due ot pollen.  Replacing the carpet in her house with hardwood . During a recent visit with daughter she used daughter's nebulizer  With albuterol/ipatropium   Has been to see ENT [facility]) who has recommended more sinus  surgery.  She has had 3 sinus surgeries/septoplasty  And turbinate reduction .   Last sleep study was 2003. Not using CPAP because of facial nerve damag  Has been choking very easily . Occurs with liquid  occurring since the accident.     Past Medical History  Diagnosis Date  . Gastritis   . 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 Rheumatoid arthritis(714.0) 2000    per patient by labs  . Ovarian cyst     right, endometriosis  . ADD (attention deficit disorder)   . Fracture of bone of left shoulder 1981  . Fracture closed, pubis 1981  .  Fracture of maxilla 1981  . History of ovarian cyst   . Endometriosis of ovary     prior GYN [name]  . History of pericarditis 1988    occurred after flu shot, with pneumonia    Past Surgical History  Procedure Laterality Date  . Anterior cruciate ligament repair  age 53    left  . Carpal tunnel release      bilateral surgeries  . Arthroscopic repair acl  1999    left knee  . Appendectomy      at age 74  . Abdominal hysterectomy      secondary to endometriosis  . Eye surgery  2005    left cataract   . Cataract extraction  Mar 2013    right eye, [place]  . Septoplasty  Dec 2006    Sonya Davis,        The following portions of the patient's history were reviewed and updated as appropriate: Allergies, current medications, and problem list.    Review of Systems:   Patient denies headache, fevers, malaise, unintentional weight loss, skin rash, eye pain, sinus congestion and sinus pain, sore throat, dysphagia,  hemoptysis , cough, dyspnea, wheezing, chest pain, palpitations, orthopnea, edema, abdominal pain, nausea, melena, diarrhea, constipation, flank pain, dysuria, hematuria, urinary  Frequency, nocturia, numbness, tingling, seizures,  Focal weakness, Loss of consciousness,  Tremor,  insomnia, depression, anxiety, and suicidal ideation.     History   Social History  . Marital Status: Single    Spouse Name: N/A    Number of Children: N/A  . Years of Education: N/A   Occupational History  . Not on file.   Social History Main Topics  . Smoking status: Former Smoker    Types: Cigarettes    Quit date: 02/11/2001  . Smokeless tobacco: Never Used  . Alcohol Use: 2.5 oz/week    5 drink(s) per week  . Drug Use: No  . Sexual Activity: Not on file   Other Topics Concern  . Not on file   Social History Narrative  . No narrative on file    Objective:  Filed Vitals:   08/07/13 0904  BP: 112/86  Pulse: 80  Temp: 98.1 F (36.7 C)  Resp: 16      General appearance: alert, cooperative and appears stated age Ears: normal TM's and external ear canals both ears Throat: lips, mucosa, and tongue normal; teeth and gums normal Neck: no adenopathy, no carotid bruit, supple, symmetrical, trachea midline and thyroid not enlarged, symmetric, no tenderness/mass/nodules Back: symmetric, no curvature. ROM normal. No CVA tenderness. Lungs: clear to auscultation bilaterally Heart: regular rate and rhythm, S1, S2 normal, no murmur, click, rub or gallop Abdomen: soft, non-tender; bowel sounds normal; no masses,  no organomegaly Pulses: 2+ and symmetric Skin: Skin color, texture, turgor normal. No rashes or lesions Lymph nodes: Cervical, supraclavicular, and axillary nodes normal.  Assessment and Plan:  Rhinorrhea Chronic, with prior facial trauma and sinus surgery.  Trial of atrovent   Nasal congestion with rhinorrhea Chronic,  trial of atrovent   COPD with acute exacerbation She had relief with albutero nebulizer,  Recommended PFTs for evaluation of breathing issues.  Osteoarthritis Multiple joints,  With prior trauma.,  Continue vicodin   Attention deficit disorder of adult with hyperactivity Managed with adderall.  Refills given.    Updated Medication List Outpatient Encounter Prescriptions as of 08/07/2013  Medication Sig  . albuterol (PROVENTIL HFA;VENTOLIN HFA) 108 (90 BASE) MCG/ACT inhaler Inhale 2 puffs into the lungs every 6 (six) hours as needed for wheezing or shortness of breath.  [name] Kitchen albuterol (PROVENTIL HFA;VENTOLIN HFA) 108 (90 BASE) MCG/ACT inhaler Inhale 2 puffs into the lungs every 6 (six) hours as needed for wheezing.  [name] Kitchen ALPRAZolam (XANAX) 0.5 MG tablet Take 1 tablet (0.5 mg total) by mouth at bedtime as needed.  [name] Kitchen amphetamine-dextroamphetamine (ADDERALL) 20 MG tablet Take 1 tablet (20 mg total) by mouth 2 (two) times daily.  . benzonatate (TESSALON) 200 MG capsule Take 1 capsule (200 mg total) by mouth 3 (three)  times daily as needed.  . calcium carbonate (OS-CAL) 600 MG TABS Take 600 mg by mouth 2 (two) times daily with a meal.    . cetirizine (ZYRTEC) 10 MG tablet Take 10 mg by mouth daily.    . cholecalciferol (VITAMIN D) 1000 UNITS tablet Take 1,000 Units by mouth daily.    [name] Calcium (STOOL SOFTENER PO) Take by mouth.    . fexofenadine (ALLEGRA) 180 MG tablet Take 1 tablet (180 mg total) by mouth daily.  . fluticasone (FLONASE) 50 MCG/ACT nasal spray Place 2 sprays into both nostrils daily.  . furosemide (LASIX) 20 MG tablet Take 1 tablet (20 mg total) by mouth daily as needed.  [name] Kitchen HYDROcodone-acetaminophen (NORCO/VICODIN) 5-325 MG per tablet Take 1 tablet by mouth every 6 (six) hours as needed. May refill on or  after August 16 2013  . traMADol (ULTRAM) 50 MG tablet TAKE 1 TABLET (50 MG TOTAL) BY MOUTH 2 (TWO) TIMES DAILY  AS NEEDED.MA XIMUM DOSE= 8  TABLETS PER DAY  . [DISCONTINUED] ALPRAZolam (XANAX) 0.5 MG tablet Take 1 tablet (0.5 mg total) by mouth at bedtime as needed.  . [DISCONTINUED] amphetamine-dextroamphetamine (ADDERALL) 20 MG tablet Take 1 tablet (20 mg total) by mouth 2 (two) times daily.  . [DISCONTINUED] Azelastine-Fluticasone (DYMISTA) 137-50 MCG/ACT SUSP Place 2 Squirts into the nose daily. In each nostril  . [DISCONTINUED] benzonatate (TESSALON) 200 MG capsule Take 1 capsule (200 mg total) by mouth 3 (three) times daily as needed.  . [DISCONTINUED] HYDROcodone-acetaminophen (NORCO/VICODIN) 5-325 MG per tablet Take 1 tablet by mouth every 6 (six) hours as needed. Or cough  . [DISCONTINUED] HYDROcodone-acetaminophen (NORCO/VICODIN) 5-325 MG per tablet Take 1 tablet by mouth every 6 (six) hours as needed. May refill on or after August 16 2013  . [DISCONTINUED] traMADol (ULTRAM) 50 MG tablet TAKE 1 TABLET (50 MG TOTAL) BY MOUTH 2 (TWO) TIMES DAILY  AS NEEDED.MA XIMUM DOSE= 8  TABLETS PER DAY  . ipratropium (ATROVENT) 0.06 % nasal spray Place 2 sprays into both nostrils 4 (four) times  daily.  [name] Kitchen zolpidem (AMBIEN) 10 MG tablet Take 1 tablet (10 mg total) by mouth at bedtime as needed.  . zoster vaccine live, PF, (ZOSTAVAX) 50932 UNT/0.65ML injection Inject 19,400 Units into the skin once.  . [DISCONTINUED] amLODipine (NORVASC) 2.5 MG tablet Take 1 tablet (2.5 mg total) by mouth daily.  . [DISCONTINUED] fluconazole (DIFLUCAN) 150 MG tablet Take 1 tablet (150 mg total) by mouth once.  . [DISCONTINUED] predniSONE (STERAPRED UNI-PAK) 10 MG tablet 6 tablets on Day 1 , then reduce by 1 tablet daily until gone     Orders Placed This Encounter  Procedures  . TSH  . Comprehensive metabolic panel  . Lipid panel  . Pulmonary function test    No Follow-up on file.

## 2013-08-09 ENCOUNTER — Encounter: Payer: Self-pay | Admitting: Internal Medicine

## 2013-08-09 NOTE — Assessment & Plan Note (Signed)
She had relief with albutero nebulizer,  Recommended PFTs for evaluation of breathing issues.

## 2013-08-09 NOTE — Assessment & Plan Note (Signed)
Chronic, with prior facial trauma and sinus surgery.  Trial of atrovent

## 2013-08-09 NOTE — Assessment & Plan Note (Signed)
Multiple joints,  With prior trauma.,  Continue vicodin

## 2013-08-09 NOTE — Assessment & Plan Note (Signed)
Chronic,  trial of atrovent

## 2013-08-09 NOTE — Assessment & Plan Note (Signed)
Managed with adderall.  Refills given.

## 2013-08-11 NOTE — Telephone Encounter (Signed)
Mailed unread message to pt  

## 2013-08-28 DIAGNOSIS — M7120 Synovial cyst of popliteal space [Baker], unspecified knee: Secondary | ICD-10-CM | POA: Diagnosis not present

## 2013-08-28 DIAGNOSIS — M1710 Unilateral primary osteoarthritis, unspecified knee: Secondary | ICD-10-CM | POA: Diagnosis not present

## 2013-09-17 ENCOUNTER — Telehealth: Payer: Self-pay | Admitting: Internal Medicine

## 2013-09-17 NOTE — Telephone Encounter (Signed)
OK to fill

## 2013-09-17 NOTE — Telephone Encounter (Signed)
Pt needs a refill on Adderall and Hydrcodone. Please advise pt when ready/msn

## 2013-09-18 MED ORDER — AMPHETAMINE-DEXTROAMPHETAMINE 20 MG PO TABS: 20.0000 mg | ORAL_TABLET | Freq: Two times a day (BID) | ORAL | Status: DC

## 2013-09-18 MED ORDER — HYDROCODONE-ACETAMINOPHEN 5-325 MG PO TABS
1.0000 | ORAL_TABLET | Freq: Four times a day (QID) | ORAL | Status: DC | PRN
Start: 2013-09-18 — End: 2013-10-19

## 2013-09-18 NOTE — Telephone Encounter (Signed)
Ok to refill,  printed rx  

## 2013-09-21 NOTE — Telephone Encounter (Signed)
Patient notified script ready and placed for pickup.

## 2013-10-19 ENCOUNTER — Telehealth: Payer: Self-pay | Admitting: Internal Medicine

## 2013-10-19 ENCOUNTER — Other Ambulatory Visit: Payer: Self-pay | Admitting: Internal Medicine

## 2013-10-19 MED ORDER — AMPHETAMINE-DEXTROAMPHETAMINE 20 MG PO TABS: 20.0000 mg | ORAL_TABLET | Freq: Two times a day (BID) | ORAL | Status: DC

## 2013-10-19 MED ORDER — HYDROCODONE-ACETAMINOPHEN 5-325 MG PO TABS: 1.0000 | ORAL_TABLET | Freq: Four times a day (QID) | ORAL | Status: DC | PRN

## 2013-10-19 NOTE — Telephone Encounter (Signed)
Appointment Request From: Meredith Staggers With Provider: Deborra Medina, MD [-Primary Care Physician-] Preferred Date Range: From 11/24/2013 To 12/08/2013 Preferred Times: Monday Morning, Tuesday Morning, Monday Afternoon, Tuesday Afternoon Reason: To address the following health maintenance concerns. Mammogram Comments: Please call pt for mammogram appt

## 2013-10-19 NOTE — Telephone Encounter (Signed)
Ok to refill,  printed rx  

## 2013-10-19 NOTE — Telephone Encounter (Signed)
Please advise OK to Fill?

## 2013-10-20 NOTE — Telephone Encounter (Signed)
Patient notified script ready for pick up and placed at front for pick up.

## 2013-11-17 ENCOUNTER — Telehealth: Payer: Self-pay | Admitting: Internal Medicine

## 2013-11-17 ENCOUNTER — Encounter: Payer: Self-pay | Admitting: Internal Medicine

## 2013-11-17 NOTE — Telephone Encounter (Signed)
Please advise front desk staff if this is possible.

## 2013-11-19 MED ORDER — HYDROCODONE-ACETAMINOPHEN 5-325 MG PO TABS: 1.0000 | ORAL_TABLET | Freq: Four times a day (QID) | ORAL | Status: DC | PRN

## 2013-11-19 MED ORDER — AMPHETAMINE-DEXTROAMPHETAMINE 20 MG PO TABS: 20.0000 mg | ORAL_TABLET | Freq: Two times a day (BID) | ORAL | Status: DC

## 2013-11-19 NOTE — Telephone Encounter (Signed)
Ok to refill,  printed rx for pick up today

## 2013-11-19 NOTE — Telephone Encounter (Signed)
Rx ready for pick up, pt aware

## 2013-12-19 ENCOUNTER — Other Ambulatory Visit: Payer: Self-pay | Admitting: Internal Medicine

## 2013-12-21 MED ORDER — HYDROCODONE-ACETAMINOPHEN 5-325 MG PO TABS: 1.0000 | ORAL_TABLET | Freq: Four times a day (QID) | ORAL | Status: DC | PRN

## 2013-12-21 MED ORDER — AMPHETAMINE-DEXTROAMPHETAMINE 20 MG PO TABS: 20.0000 mg | ORAL_TABLET | Freq: Two times a day (BID) | ORAL | Status: DC

## 2013-12-21 NOTE — Telephone Encounter (Signed)
Refill one 30 days only.  Has not been seen in over 6 months so needs office visit prior to any more refills 

## 2013-12-21 NOTE — Telephone Encounter (Signed)
Last visit 08/07/13, ok refills?

## 2014-01-18 ENCOUNTER — Telehealth: Payer: Self-pay | Admitting: Internal Medicine

## 2014-01-18 MED ORDER — HYDROCODONE-ACETAMINOPHEN 5-325 MG PO TABS: 1.0000 | ORAL_TABLET | Freq: Four times a day (QID) | ORAL | Status: DC | PRN

## 2014-01-18 MED ORDER — AMPHETAMINE-DEXTROAMPHETAMINE 20 MG PO TABS: 20.0000 mg | ORAL_TABLET | Freq: Two times a day (BID) | ORAL | Status: DC

## 2014-01-18 NOTE — Telephone Encounter (Signed)
Patient in office today requested refill on Hydrocodone and Adderall has an appointment set up for 02/1814 [facility] to fill last fill 12/21/13 on both scripts please advise.

## 2014-01-18 NOTE — Telephone Encounter (Signed)
Ok to refill,  printed rx  

## 2014-01-18 NOTE — Telephone Encounter (Signed)
Pt notified Rxs ready for pick-up 

## 2014-01-26 ENCOUNTER — Encounter: Payer: Self-pay | Admitting: Internal Medicine

## 2014-01-27 ENCOUNTER — Other Ambulatory Visit: Payer: Self-pay | Admitting: Internal Medicine

## 2014-01-27 MED ORDER — PREDNISONE (PAK) 10 MG PO TABS: ORAL_TABLET | ORAL | Status: DC

## 2014-02-02 DIAGNOSIS — M7121 Synovial cyst of popliteal space [Baker], right knee: Secondary | ICD-10-CM | POA: Diagnosis not present

## 2014-02-02 DIAGNOSIS — M1711 Unilateral primary osteoarthritis, right knee: Secondary | ICD-10-CM | POA: Diagnosis not present

## 2014-02-26 ENCOUNTER — Ambulatory Visit (INDEPENDENT_AMBULATORY_CARE_PROVIDER_SITE_OTHER): Payer: Medicare Other | Admitting: Internal Medicine

## 2014-02-26 ENCOUNTER — Encounter: Payer: Self-pay | Admitting: Internal Medicine

## 2014-02-26 VITALS — BP 118/82 | HR 85 | Temp 98.6°F | Resp 14 | Ht 63.0 in | Wt 114.2 lb

## 2014-02-26 DIAGNOSIS — F900 Attention-deficit hyperactivity disorder, predominantly inattentive type: Secondary | ICD-10-CM | POA: Diagnosis not present

## 2014-02-26 DIAGNOSIS — M153 Secondary multiple arthritis: Secondary | ICD-10-CM | POA: Diagnosis not present

## 2014-02-26 DIAGNOSIS — J0110 Acute frontal sinusitis, unspecified: Secondary | ICD-10-CM | POA: Diagnosis not present

## 2014-02-26 DIAGNOSIS — F909 Attention-deficit hyperactivity disorder, unspecified type: Secondary | ICD-10-CM

## 2014-02-26 MED ORDER — PREDNISONE (PAK) 10 MG PO TABS: ORAL_TABLET | ORAL | Status: DC

## 2014-02-26 MED ORDER — ALPRAZOLAM 0.5 MG PO TABS: 0.5000 mg | ORAL_TABLET | Freq: Every evening | ORAL | Status: DC | PRN

## 2014-02-26 MED ORDER — HYDROCOD POLST-CHLORPHEN POLST 10-8 MG/5ML PO LQCR: 5.0000 mL | Freq: Two times a day (BID) | ORAL | Status: DC | PRN

## 2014-02-26 MED ORDER — LEVOFLOXACIN 500 MG PO TABS
500.0000 mg | ORAL_TABLET | Freq: Every day | ORAL | Status: DC
Start: 2014-02-26 — End: 2014-09-17

## 2014-02-26 MED ORDER — AMPHETAMINE-DEXTROAMPHETAMINE 20 MG PO TABS: 20.0000 mg | ORAL_TABLET | Freq: Two times a day (BID) | ORAL | Status: DC

## 2014-02-26 MED ORDER — HYDROCODONE-ACETAMINOPHEN 5-325 MG PO TABS: 1.0000 | ORAL_TABLET | Freq: Four times a day (QID) | ORAL | Status: DC | PRN

## 2014-02-26 NOTE — Patient Instructions (Signed)
I am treating you for sinusitis/otitis which is a complication from your viral infection due to  persistent sinus congestion.   I am prescribing an antibiotic (levaquin) and a prednisone taper  To manage the infection and the inflammation in your ear/sinuses.   I also advise use of the following OTC meds to help with your other symptoms.   Take generic OTC benadryl 25 mg every 8 hours for the drainage,  Sudafed PE  10 to 30 mg every 8 hours for the congestion, you may substitute Afrin nasal spray for the nighttime dose of sudafed PE  If needed to prevent insomnia.  flush your sinuses twice daily with Simply Saline (do over the sink because if you do it right you will spit out globs of mucus)  Use benzonatate capsules   FOR THE daytime COUGH.  Gargle with salt water as needed for sore throat.   Use the tussionex liquid cough syrup at night for severe cough or headache.  It has vicodin in it so DO NOT SHARE WITH OTHERS  Please take a probiotic ( Align, Floraque or Culturelle) while you are on the antibiotic to prevent  the  serious antibiotic associated diarrhea  Called clostridium dificile colitis and a vaginal yeast infection

## 2014-02-26 NOTE — Progress Notes (Signed)
Patient ID: Sonya Davis, female   DOB: 24-Apr-1945, 68 y.o.   MRN: [id number]    Patient Active Problem List   Diagnosis Date Noted  . Sinusitis, acute frontal 02/27/2014  . Right-sided chest wall pain 03/31/2013  . Rhinorrhea 03/31/2013  . Nasal congestion with rhinorrhea 03/30/2013  . Elevated BP 09/03/2012  . Routine general medical examination at a health care facility 01/13/2012  . OSA (obstructive sleep apnea) 01/13/2012  . History of pericarditis   . Abnormal finding on EKG 05/31/2011  . Attention deficit disorder of adult with hyperactivity 02/13/2011  . Screening for osteoporosis 02/13/2011  . Fracture closed, pubis   . Fracture of maxilla   . History of ovarian cyst   . Endometriosis of ovary   . Osteoarthritis 02/12/2011    Subjective:  CC:   Chief Complaint  Patient presents with  . Medication Refill    xanax, benzonate, adderall, hydrocodone.pt. also has a productive cough    HPI:   Sonya Davis is a 68 y.o. female who presents for medication refills on controlled substances and evaluation of sinus pain and cough.   productive cough for the past week.  Started with sore throat,  Malaise,  ,  took otc medications for over 5 days  No significant change  Frontal sinuses are   tender. ;left ear hurting   Past Medical History  Diagnosis Date  . Gastritis   . 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 Rheumatoid arthritis(714.0) 2000    per patient by labs  . Ovarian cyst     right, endometriosis  . ADD (attention deficit disorder)   . Fracture of bone of left shoulder 1981  . Fracture closed, pubis 1981  . Fracture of maxilla 1981  . History of ovarian cyst   . Endometriosis of ovary     prior GYN [name]  . History of pericarditis 1988    occurred after flu shot, with pneumonia    Past Surgical History  Procedure Laterality Date  . Anterior cruciate ligament repair  age 55    left   . Carpal tunnel release      bilateral surgeries  . Arthroscopic repair acl  1999    left knee  . Appendectomy      at age 89  . Abdominal hysterectomy      secondary to endometriosis  . Eye surgery  2005    left cataract   . Cataract extraction  Mar 2013    right eye, [place]  . Septoplasty  Dec 2006    [name],        The following portions of the patient's history were reviewed and updated as appropriate: Allergies, current medications, and problem list.    Review of Systems:   Patient denies headache, fevers, malaise, unintentional weight loss, skin rash, eye pain, sinus congestion and sinus pain, sore throat, dysphagia,  hemoptysis , cough, dyspnea, wheezing, chest pain, palpitations, orthopnea, edema, abdominal pain, nausea, melena, diarrhea, constipation, flank pain, dysuria, hematuria, urinary  Frequency, nocturia, numbness, tingling, seizures,  Focal weakness, Loss of consciousness,  Tremor, insomnia, depression, anxiety, and suicidal ideation.     History   Social History  . Marital Status: Single    Spouse Name: N/A    Number of Children: N/A  . Years of Education: N/A   Occupational History  . Not on file.   Social History Main Topics  .  Smoking status: Former Smoker    Types: Cigarettes    Quit date: 02/11/2001  . Smokeless tobacco: Never Used  . Alcohol Use: 2.5 oz/week    5 drink(s) per week  . Drug Use: No  . Sexual Activity: Not on file   Other Topics Concern  . Not on file   Social History Narrative    Objective:  Filed Vitals:   02/26/14 0948  BP: 118/82  Pulse: 85  Temp: 98.6 F (37 C)  Resp: 14     General appearance: alert, cooperative and appears stated age Ears: left TM injected,  normal right TM and external ear canals both ears Sinuses: bilateral frontal sinus pain Throat: lips, mucosa, and tongue normal; teeth and gums normal Neck: no adenopathy, no carotid bruit, supple, symmetrical, trachea midline and  thyroid not enlarged, symmetric, no tenderness/mass/nodules Back: symmetric, no curvature. ROM normal. No CVA tenderness. Lungs: clear to auscultation bilaterally Heart: regular rate and rhythm, S1, S2 normal, no murmur, click, rub or gallop Abdomen: soft, non-tender; bowel sounds normal; no masses,  no organomegaly Pulses: 2+ and symmetric Skin: Skin color, texture, turgor normal. No rashes or lesions Lymph nodes: Cervical, supraclavicular, and axillary nodes normal.  Assessment and Plan:  Sinusitis, acute frontal Given chronicity of symptoms, development of facial pain and exam consistent with bacterial URI,  Will treat with empiric antibiotics, decongestants, and saline lavage.  Adding steroid nasal spray if not already taking.   Osteoarthritis With chronic daily pain secondary to remote fractures.  Refill on vicodin given .   Attention deficit disorder of adult with hyperactivity Managed with adderall.  Dose has been stable. Refills given.       Updated Medication List Outpatient Encounter Prescriptions as of 02/26/2014  Medication Sig  . albuterol (PROVENTIL HFA;VENTOLIN HFA) 108 (90 BASE) MCG/ACT inhaler Inhale 2 puffs into the lungs every 6 (six) hours as needed for wheezing or shortness of breath.  . ALPRAZolam (XANAX) 0.5 MG tablet Take 1 tablet (0.5 mg total) by mouth at bedtime as needed.  [name] Kitchen amphetamine-dextroamphetamine (ADDERALL) 20 MG tablet Take 1 tablet (20 mg total) by mouth 2 (two) times daily.  . benzonatate (TESSALON) 200 MG capsule Take 1 capsule (200 mg total) by mouth 3 (three) times daily as needed.  . calcium carbonate (OS-CAL) 600 MG TABS Take 600 mg by mouth 2 (two) times daily with a meal.    . cetirizine (ZYRTEC) 10 MG tablet Take 10 mg by mouth daily.    . cholecalciferol (VITAMIN D) 1000 UNITS tablet Take 1,000 Units by mouth daily.    [name] Calcium (STOOL SOFTENER PO) Take by mouth.    . fexofenadine (ALLEGRA) 180 MG tablet Take 1 tablet (180 mg  total) by mouth daily.  . furosemide (LASIX) 20 MG tablet Take 1 tablet (20 mg total) by mouth daily as needed.  [name] Kitchen HYDROcodone-acetaminophen (NORCO/VICODIN) 5-325 MG per tablet Take 1 tablet by mouth every 6 (six) hours as needed.  [name] Kitchen ipratropium (ATROVENT) 0.06 % nasal spray Place 2 sprays into both nostrils 4 (four) times daily.  . traMADol (ULTRAM) 50 MG tablet TAKE 1 TABLET (50 MG TOTAL) BY MOUTH 2 (TWO) TIMES DAILY  AS NEEDED.MA XIMUM DOSE= 8  TABLETS PER DAY  . zoster vaccine live, PF, (ZOSTAVAX) 50277 UNT/0.65ML injection Inject 19,400 Units into the skin once.  . [DISCONTINUED] ALPRAZolam (XANAX) 0.5 MG tablet Take 1 tablet (0.5 mg total) by mouth at bedtime as needed.  . [DISCONTINUED] amphetamine-dextroamphetamine (ADDERALL) 20 MG  tablet Take 1 tablet (20 mg total) by mouth 2 (two) times daily.  . [DISCONTINUED] fluticasone (FLONASE) 50 MCG/ACT nasal spray Place 2 sprays into both nostrils daily.  . [DISCONTINUED] HYDROcodone-acetaminophen (NORCO/VICODIN) 5-325 MG per tablet Take 1 tablet by mouth every 6 (six) hours as needed.  . [DISCONTINUED] predniSONE (STERAPRED UNI-PAK) 10 MG tablet 6 tablets on Day 1 , then reduce by 1 tablet daily until gone  . [DISCONTINUED] zolpidem (AMBIEN) 10 MG tablet Take 1 tablet (10 mg total) by mouth at bedtime as needed.  . chlorpheniramine-HYDROcodone (TUSSIONEX PENNKINETIC ER) 10-8 MG/5ML LQCR Take 5 mLs by mouth every 12 (twelve) hours as needed for cough.  [name] Kitchen levofloxacin (LEVAQUIN) 500 MG tablet Take 1 tablet (500 mg total) by mouth daily.  . predniSONE (STERAPRED UNI-PAK) 10 MG tablet 6 tablets on Day 1 , then reduce by 1 tablet daily until gone  . [DISCONTINUED] albuterol (PROVENTIL HFA;VENTOLIN HFA) 108 (90 BASE) MCG/ACT inhaler Inhale 2 puffs into the lungs every 6 (six) hours as needed for wheezing.     No orders of the defined types were placed in this encounter.    No Follow-up on file.

## 2014-02-27 ENCOUNTER — Encounter: Payer: Self-pay | Admitting: Internal Medicine

## 2014-02-27 DIAGNOSIS — J0110 Acute frontal sinusitis, unspecified: Secondary | ICD-10-CM | POA: Insufficient documentation

## 2014-02-27 NOTE — Assessment & Plan Note (Signed)
Given chronicity of symptoms, development of facial pain and exam consistent with bacterial URI,  Will treat with empiric antibiotics, decongestants, and saline lavage.  Adding steroid nasal spray if not already taking.  °

## 2014-02-27 NOTE — Assessment & Plan Note (Signed)
With chronic daily pain secondary to remote fractures.  Refill on vicodin given .

## 2014-02-27 NOTE — Assessment & Plan Note (Signed)
Managed with adderall.  Dose has been stable. Refills given.

## 2014-03-13 ENCOUNTER — Other Ambulatory Visit: Payer: Self-pay | Admitting: Internal Medicine

## 2014-03-13 NOTE — Telephone Encounter (Signed)
Okay to refill? Last OV: 02/26/14

## 2014-03-16 NOTE — Telephone Encounter (Signed)
Faxed to pharmacy

## 2014-03-16 NOTE — Telephone Encounter (Signed)
90 day supply authorized and printed  

## 2014-03-28 ENCOUNTER — Encounter: Payer: Self-pay | Admitting: Internal Medicine

## 2014-03-28 ENCOUNTER — Telehealth: Payer: Self-pay | Admitting: Internal Medicine

## 2014-03-29 MED ORDER — AMPHETAMINE-DEXTROAMPHETAMINE 20 MG PO TABS: 20.0000 mg | ORAL_TABLET | Freq: Two times a day (BID) | ORAL | Status: DC

## 2014-03-29 MED ORDER — HYDROCODONE-ACETAMINOPHEN 5-325 MG PO TABS: 1.0000 | ORAL_TABLET | Freq: Four times a day (QID) | ORAL | Status: DC | PRN

## 2014-03-29 MED ORDER — FLUCONAZOLE 150 MG PO TABS: 150.0000 mg | ORAL_TABLET | Freq: Every day | ORAL | Status: DC

## 2014-03-29 NOTE — Telephone Encounter (Signed)
rx ready for pick up at front desk.  Pt notified.

## 2014-03-29 NOTE — Telephone Encounter (Signed)
2 months of rx's printed for refill

## 2014-05-27 ENCOUNTER — Telehealth: Payer: Self-pay | Admitting: Internal Medicine

## 2014-05-27 MED ORDER — AMPHETAMINE-DEXTROAMPHETAMINE 20 MG PO TABS: 20.0000 mg | ORAL_TABLET | Freq: Two times a day (BID) | ORAL | Status: DC

## 2014-05-27 MED ORDER — HYDROCODONE-ACETAMINOPHEN 5-325 MG PO TABS: 1.0000 | ORAL_TABLET | Freq: Four times a day (QID) | ORAL | Status: DC | PRN

## 2014-05-27 MED ORDER — AMPHETAMINE-DEXTROAMPHETAMINE 20 MG PO TABS
20.0000 mg | ORAL_TABLET | Freq: Two times a day (BID) | ORAL | Status: DC
Start: 2014-05-27 — End: 2014-05-27

## 2014-05-27 NOTE — Telephone Encounter (Signed)
2 months  Of refils on hycdrocodone and adderall have been printed

## 2014-05-27 NOTE — Telephone Encounter (Signed)
Ok to fill 

## 2014-05-27 NOTE — Telephone Encounter (Signed)
Patient notified Rx is ready for pick-up. 

## 2014-06-15 DIAGNOSIS — J329 Chronic sinusitis, unspecified: Secondary | ICD-10-CM | POA: Diagnosis not present

## 2014-06-15 DIAGNOSIS — R0982 Postnasal drip: Secondary | ICD-10-CM | POA: Diagnosis not present

## 2014-06-15 DIAGNOSIS — J301 Allergic rhinitis due to pollen: Secondary | ICD-10-CM | POA: Diagnosis not present

## 2014-06-29 DIAGNOSIS — J301 Allergic rhinitis due to pollen: Secondary | ICD-10-CM | POA: Diagnosis not present

## 2014-07-01 DIAGNOSIS — J329 Chronic sinusitis, unspecified: Secondary | ICD-10-CM | POA: Diagnosis not present

## 2014-07-01 DIAGNOSIS — H903 Sensorineural hearing loss, bilateral: Secondary | ICD-10-CM | POA: Diagnosis not present

## 2014-07-02 DIAGNOSIS — H903 Sensorineural hearing loss, bilateral: Secondary | ICD-10-CM | POA: Diagnosis not present

## 2014-07-02 DIAGNOSIS — J328 Other chronic sinusitis: Secondary | ICD-10-CM | POA: Diagnosis not present

## 2014-07-02 DIAGNOSIS — J301 Allergic rhinitis due to pollen: Secondary | ICD-10-CM | POA: Diagnosis not present

## 2014-07-04 NOTE — Op Note (Signed)
PATIENT NAME:  CICI, RODRIGES MR#:  [id number] DATE OF BIRTH:  15-Jun-1945  DATE OF PROCEDURE:  06/04/2011  PREOPERATIVE DIAGNOSIS:  Cataract, right eye.   POSTOPERATIVE DIAGNOSIS:  Cataract, right eye.  PROCEDURE PERFORMED:  Extracapsular cataract extraction using phacoemulsification with placement of an Alcon [id number], 18.5-diopter posterior chamber lens, serial # [id number].  SURGEON:  Loura Back. Jayna Mulnix, MD  ASSISTANT:  None.  ANESTHESIA:  4% lidocaine and 0.75% Marcaine in a 50/50 mixture with 10 units per mL of Hylenex added, given as peribulbar.  ANESTHESIOLOGIST:  Dr. Marcello Moores   COMPLICATIONS:  None.  ESTIMATED BLOOD LOSS:  Less than 1 mL.  DESCRIPTION OF PROCEDURE:  The patient was brought to the operating room and given a peribulbar block.  The patient was then prepped and draped in the usual fashion.  The vertical rectus muscles were imbricated using 5-0 silk sutures.  These sutures were then clamped to the sterile drapes as bridle sutures.  A limbal peritomy was performed extending two clock hours and hemostasis was obtained with cautery.  A partial thickness scleral groove was made at the surgical limbus and dissected anteriorly in a lamellar dissection using an Alcon crescent knife.  The anterior chamber was entered superonasally with a Superblade and through the lamellar dissection with a 2.6 mm keratome.  DisCoVisc was used to replace the aqueous and a continuous tear capsulorrhexis was carried out.  Hydrodissection and hydrodelineation were carried out with balanced salt and a 27 gauge canula.  The nucleus was rotated to confirm the effectiveness of the hydrodissection.  Phacoemulsification was carried out using a divide-and-conquer technique.  Total ultrasound time was 1 minute and 22.3 seconds with an average power of 24 percent, CDE 30.27.  Irrigation/aspiration was used to remove the residual cortex.  DisCoVisc was used to inflate the capsule and the internal incision  was enlarged to 3 mm with the crescent knife.  The intraocular lens was folded and inserted into the capsular bag using the AcrySert delivery system.  Irrigation/aspiration was used to remove the residual DisCoVisc.  Miostat was injected into the anterior chamber through the paracentesis track to inflate the anterior chamber and induce miosis.  The wound was checked for leaks and none were found. The conjunctiva was closed with cautery and the bridle sutures were removed.  Two drops of 0.3% Vigamox were placed on the eye.   An eye shield was placed on the eye.  The patient was discharged to the recovery room in good condition.  ____________________________ Loura Back Marysue Fait, MD sad:drc D: 06/04/2011 13:07:35 ET T: 06/04/2011 13:12:15 ET JOB#: [id number]  cc: Remo Lipps A. Priscella Donna, MD, <Dictator> Martie Lee MD ELECTRONICALLY SIGNED 06/11/2011 13:15

## 2014-07-05 DIAGNOSIS — J301 Allergic rhinitis due to pollen: Secondary | ICD-10-CM | POA: Diagnosis not present

## 2014-07-09 DIAGNOSIS — J301 Allergic rhinitis due to pollen: Secondary | ICD-10-CM | POA: Diagnosis not present

## 2014-07-12 DIAGNOSIS — J301 Allergic rhinitis due to pollen: Secondary | ICD-10-CM | POA: Diagnosis not present

## 2014-07-20 DIAGNOSIS — J301 Allergic rhinitis due to pollen: Secondary | ICD-10-CM | POA: Diagnosis not present

## 2014-07-21 DIAGNOSIS — R05 Cough: Secondary | ICD-10-CM | POA: Diagnosis not present

## 2014-07-21 DIAGNOSIS — J301 Allergic rhinitis due to pollen: Secondary | ICD-10-CM | POA: Diagnosis not present

## 2014-07-21 DIAGNOSIS — J4521 Mild intermittent asthma with (acute) exacerbation: Secondary | ICD-10-CM | POA: Diagnosis not present

## 2014-07-23 DIAGNOSIS — J301 Allergic rhinitis due to pollen: Secondary | ICD-10-CM | POA: Diagnosis not present

## 2014-07-26 ENCOUNTER — Encounter: Payer: Self-pay | Admitting: Internal Medicine

## 2014-07-26 ENCOUNTER — Other Ambulatory Visit: Payer: Self-pay | Admitting: Internal Medicine

## 2014-07-26 NOTE — Telephone Encounter (Signed)
Patient requesting refill on Hydrocodone and adderall ok to fill last fill 05/27/14 for two months,

## 2014-07-27 DIAGNOSIS — J301 Allergic rhinitis due to pollen: Secondary | ICD-10-CM | POA: Diagnosis not present

## 2014-07-27 MED ORDER — HYDROCODONE-ACETAMINOPHEN 5-325 MG PO TABS: 1.0000 | ORAL_TABLET | Freq: Four times a day (QID) | ORAL | Status: DC | PRN

## 2014-07-27 MED ORDER — AMPHETAMINE-DEXTROAMPHETAMINE 20 MG PO TABS: 20.0000 mg | ORAL_TABLET | Freq: Two times a day (BID) | ORAL | Status: DC

## 2014-07-27 NOTE — Telephone Encounter (Signed)
Refills awaiting signature patient advised By e-mail ready for pick up after 2 PM .

## 2014-07-27 NOTE — Addendum Note (Signed)
Addended by: Kerin Salen R on: 07/27/2014 11:00 AM   Modules accepted: Orders

## 2014-07-29 DIAGNOSIS — J301 Allergic rhinitis due to pollen: Secondary | ICD-10-CM | POA: Diagnosis not present

## 2014-07-30 DIAGNOSIS — J301 Allergic rhinitis due to pollen: Secondary | ICD-10-CM | POA: Diagnosis not present

## 2014-08-02 ENCOUNTER — Encounter: Payer: Self-pay | Admitting: Internal Medicine

## 2014-08-03 DIAGNOSIS — J301 Allergic rhinitis due to pollen: Secondary | ICD-10-CM | POA: Diagnosis not present

## 2014-08-13 DIAGNOSIS — J301 Allergic rhinitis due to pollen: Secondary | ICD-10-CM | POA: Diagnosis not present

## 2014-08-24 DIAGNOSIS — J301 Allergic rhinitis due to pollen: Secondary | ICD-10-CM | POA: Diagnosis not present

## 2014-08-31 ENCOUNTER — Other Ambulatory Visit: Payer: Self-pay | Admitting: Internal Medicine

## 2014-08-31 DIAGNOSIS — J301 Allergic rhinitis due to pollen: Secondary | ICD-10-CM | POA: Diagnosis not present

## 2014-09-08 ENCOUNTER — Other Ambulatory Visit: Payer: Self-pay | Admitting: Internal Medicine

## 2014-09-08 DIAGNOSIS — J301 Allergic rhinitis due to pollen: Secondary | ICD-10-CM | POA: Diagnosis not present

## 2014-09-08 NOTE — Telephone Encounter (Signed)
Last Ov 12.18.15, last refill 5.8.16.  Please advise refill

## 2014-09-09 NOTE — Telephone Encounter (Signed)
Refill for 30 days only.  Will need six month follow up prior to any more refills

## 2014-09-09 NOTE — Telephone Encounter (Signed)
rx faxed

## 2014-09-14 DIAGNOSIS — J301 Allergic rhinitis due to pollen: Secondary | ICD-10-CM | POA: Diagnosis not present

## 2014-09-17 ENCOUNTER — Ambulatory Visit (INDEPENDENT_AMBULATORY_CARE_PROVIDER_SITE_OTHER): Payer: Medicare Other | Admitting: Internal Medicine

## 2014-09-17 ENCOUNTER — Encounter: Payer: Self-pay | Admitting: Internal Medicine

## 2014-09-17 VITALS — BP 132/84 | HR 96 | Temp 99.1°F | Resp 16 | Ht 63.0 in | Wt 115.1 lb

## 2014-09-17 DIAGNOSIS — F900 Attention-deficit hyperactivity disorder, predominantly inattentive type: Secondary | ICD-10-CM

## 2014-09-17 DIAGNOSIS — Z79899 Other long term (current) drug therapy: Secondary | ICD-10-CM

## 2014-09-17 DIAGNOSIS — Z23 Encounter for immunization: Secondary | ICD-10-CM | POA: Diagnosis not present

## 2014-09-17 DIAGNOSIS — R5383 Other fatigue: Secondary | ICD-10-CM | POA: Diagnosis not present

## 2014-09-17 DIAGNOSIS — K2100 Gastro-esophageal reflux disease with esophagitis, without bleeding: Secondary | ICD-10-CM

## 2014-09-17 DIAGNOSIS — F909 Attention-deficit hyperactivity disorder, unspecified type: Secondary | ICD-10-CM

## 2014-09-17 DIAGNOSIS — E785 Hyperlipidemia, unspecified: Secondary | ICD-10-CM

## 2014-09-17 DIAGNOSIS — J3489 Other specified disorders of nose and nasal sinuses: Secondary | ICD-10-CM

## 2014-09-17 DIAGNOSIS — R05 Cough: Secondary | ICD-10-CM

## 2014-09-17 DIAGNOSIS — M153 Secondary multiple arthritis: Secondary | ICD-10-CM

## 2014-09-17 DIAGNOSIS — Z1159 Encounter for screening for other viral diseases: Secondary | ICD-10-CM

## 2014-09-17 DIAGNOSIS — R059 Cough, unspecified: Secondary | ICD-10-CM

## 2014-09-17 DIAGNOSIS — E559 Vitamin D deficiency, unspecified: Secondary | ICD-10-CM

## 2014-09-17 DIAGNOSIS — Z1239 Encounter for other screening for malignant neoplasm of breast: Secondary | ICD-10-CM

## 2014-09-17 DIAGNOSIS — R0981 Nasal congestion: Secondary | ICD-10-CM

## 2014-09-17 LAB — CBC WITH DIFFERENTIAL/PLATELET
BASOS ABS: 0 10*3/uL (ref 0.0–0.1)
Basophils Relative: 0.4 % (ref 0.0–3.0)
EOS ABS: 0.3 10*3/uL (ref 0.0–0.7)
Eosinophils Relative: 5.1 % — ABNORMAL HIGH (ref 0.0–5.0)
HCT: 41.8 % (ref 36.0–46.0)
Hemoglobin: 13.9 g/dL (ref 12.0–15.0)
Lymphocytes Relative: 30.2 % (ref 12.0–46.0)
Lymphs Abs: 1.9 10*3/uL (ref 0.7–4.0)
MCHC: 33.2 g/dL (ref 30.0–36.0)
MCV: 92.4 fl (ref 78.0–100.0)
MONO ABS: 0.5 10*3/uL (ref 0.1–1.0)
Monocytes Relative: 8.1 % (ref 3.0–12.0)
NEUTROS PCT: 56.2 % (ref 43.0–77.0)
Neutro Abs: 3.6 10*3/uL (ref 1.4–7.7)
PLATELETS: 265 10*3/uL (ref 150.0–400.0)
RBC: 4.52 Mil/uL (ref 3.87–5.11)
RDW: 13.3 % (ref 11.5–15.5)
WBC: 6.4 10*3/uL (ref 4.0–10.5)

## 2014-09-17 LAB — COMPREHENSIVE METABOLIC PANEL
ALK PHOS: 62 U/L (ref 39–117)
ALT: 16 U/L (ref 0–35)
AST: 24 U/L (ref 0–37)
Albumin: 4.3 g/dL (ref 3.5–5.2)
BUN: 15 mg/dL (ref 6–23)
CO2: 32 mEq/L (ref 19–32)
Calcium: 9.5 mg/dL (ref 8.4–10.5)
Chloride: 99 mEq/L (ref 96–112)
Creatinine, Ser: 0.92 mg/dL (ref 0.40–1.20)
GFR: 64.31 mL/min (ref >60.00)
GLUCOSE: 85 mg/dL (ref 70–99)
Potassium: 3.8 mEq/L (ref 3.5–5.1)
SODIUM: 138 meq/L (ref 135–145)
TOTAL PROTEIN: 6.7 g/dL (ref 6.0–8.3)
Total Bilirubin: 0.5 mg/dL (ref 0.2–1.2)

## 2014-09-17 LAB — LIPID PANEL
Cholesterol: 238 mg/dL — ABNORMAL HIGH (ref 0–200)
HDL: 69.4 mg/dL (ref >39.00)
LDL CALC: 137 mg/dL — AB (ref 0–99)
NonHDL: 168.6
TRIGLYCERIDES: 157 mg/dL — AB (ref 0.0–149.0)
Total CHOL/HDL Ratio: 3
VLDL: 31.4 mg/dL (ref 0.0–40.0)

## 2014-09-17 LAB — VITAMIN D 25 HYDROXY (VIT D DEFICIENCY, FRACTURES): VITD: 25.3 ng/mL — AB (ref 30.00–100.00)

## 2014-09-17 LAB — TSH: TSH: 0.58 u[IU]/mL (ref 0.35–4.50)

## 2014-09-17 MED ORDER — HYDROCODONE-ACETAMINOPHEN 5-325 MG PO TABS: 1.0000 | ORAL_TABLET | Freq: Four times a day (QID) | ORAL | Status: DC | PRN

## 2014-09-17 MED ORDER — OMEPRAZOLE 20 MG PO CPDR: 20.0000 mg | DELAYED_RELEASE_CAPSULE | Freq: Every day | ORAL | Status: DC

## 2014-09-17 MED ORDER — BENZONATATE 200 MG PO CAPS: 200.0000 mg | ORAL_CAPSULE | Freq: Three times a day (TID) | ORAL | Status: DC | PRN

## 2014-09-17 MED ORDER — AMPHETAMINE-DEXTROAMPHETAMINE 20 MG PO TABS: 20.0000 mg | ORAL_TABLET | Freq: Two times a day (BID) | ORAL | Status: DC

## 2014-09-17 MED ORDER — ALPRAZOLAM 0.5 MG PO TABS: ORAL_TABLET | ORAL | Status: DC

## 2014-09-17 MED ORDER — FUROSEMIDE 20 MG PO TABS: 20.0000 mg | ORAL_TABLET | Freq: Every day | ORAL | Status: DC | PRN

## 2014-09-17 NOTE — Patient Instructions (Signed)
I have refilled the cough capsules  I am adding a daily dose of omeprazole to take in the early evening to treat any reflux that may be causing you to cough  I also recommend FLUSHING your sinuses before bedtime with [facility]'s sinus rinse

## 2014-09-17 NOTE — Progress Notes (Signed)
Pre-visit discussion using our clinic review tool. No additional management support is needed unless otherwise documented below in the visit note.  

## 2014-09-17 NOTE — Progress Notes (Signed)
Subjective:  Patient ID: Sonya Davis, female    DOB: 10/29/1945  Age: 69 y.o. MRN: [id number]  CC: The primary encounter diagnosis was Gastroesophageal reflux disease with esophagitis. Diagnoses of Need for hepatitis C screening test, Long-term use of high-risk medication, Hyperlipidemia LDL goal <130, Other fatigue, Vitamin D deficiency, Breast cancer screening, Need for vaccination with 13-polyvalent pneumococcal conjugate vaccine, Hyperlipidemia, Nasal congestion with rhinorrhea, Cough, Secondary osteoarthritis of multiple sites, and Attention deficit disorder of adult with hyperactivity were also pertinent to this visit.  HPI Sonya Davis presents for   1) persistent hacking cough with nighttime drainage.  She has been repeatedlly treated without resolution,  Was referred to ENT and had a CT of septum and sinsuses which was reportedly normal.  They sent her for  allergy testing,  And has been started on immunotherapy with allergy shots.  No appreciable difference yet.   NO prior treatment for GERD . Denies fevers, facial pain, purulent discharge, and ear pain.  2) refills on medications for ADD and chronic pain secondary to secondary OA from prior fractures of pelvis and facial bones.  She is taking her medications as directed and uses no more than two vicodin daily.,   Outpatient Prescriptions Prior to Visit  Medication Sig Dispense Refill  . albuterol (PROVENTIL HFA;VENTOLIN HFA) 108 (90 BASE) MCG/ACT inhaler Inhale 2 puffs into the lungs every 6 (six) hours as needed for wheezing or shortness of breath. 6.7 g 3  . calcium carbonate (OS-CAL) 600 MG TABS Take 600 mg by mouth 2 (two) times daily with a meal.      . cetirizine (ZYRTEC) 10 MG tablet Take 10 mg by mouth daily.      . cholecalciferol (VITAMIN D) 1000 UNITS tablet Take 1,000 Units by mouth daily.      [name] Calcium (STOOL SOFTENER PO) Take by mouth.      . fexofenadine (ALLEGRA) 180 MG tablet Take 1 tablet (180 mg total)  by mouth daily. 30 tablet 11  . ipratropium (ATROVENT) 0.06 % nasal spray PLACE 2 SPRAYS INTO BOTH NOSTRILS 4 (FOUR) TIMES DAILY. 15 mL 11  . traMADol (ULTRAM) 50 MG tablet TAKE 1 TABLET BY MOUTH TWICE A DAY AS NEEDED (MAX DOSE OF 8 TABS A DAY) 180 tablet 4  . ALPRAZolam (XANAX) 0.5 MG tablet TAKE  ONE TABLET BY MOUTH NIGHTLY AT BEDTIME AS NEEDED 30 tablet 0  . amphetamine-dextroamphetamine (ADDERALL) 20 MG tablet Take 1 tablet (20 mg total) by mouth 2 (two) times daily. 60 tablet 0  . amphetamine-dextroamphetamine (ADDERALL) 20 MG tablet Take 1 tablet (20 mg total) by mouth 2 (two) times daily. 60 tablet 0  . benzonatate (TESSALON) 200 MG capsule Take 1 capsule (200 mg total) by mouth 3 (three) times daily as needed. 60 capsule 3  . furosemide (LASIX) 20 MG tablet Take 1 tablet (20 mg total) by mouth daily as needed. 30 tablet 3  . HYDROcodone-acetaminophen (NORCO/VICODIN) 5-325 MG per tablet Take 1 tablet by mouth every 6 (six) hours as needed. 60 tablet 0  . HYDROcodone-acetaminophen (NORCO/VICODIN) 5-325 MG per tablet Take 1 tablet by mouth every 6 (six) hours as needed. 60 tablet 0  . chlorpheniramine-HYDROcodone (TUSSIONEX PENNKINETIC ER) 10-8 MG/5ML LQCR Take 5 mLs by mouth every 12 (twelve) hours as needed for cough. 200 mL 0  . fluconazole (DIFLUCAN) 150 MG tablet Take 1 tablet (150 mg total) by mouth daily. 2 tablet 0  . levofloxacin (LEVAQUIN) 500 MG tablet Take 1 tablet (  500 mg total) by mouth daily. 7 tablet 0  . predniSONE (STERAPRED UNI-PAK) 10 MG tablet 6 tablets on Day 1 , then reduce by 1 tablet daily until gone 21 tablet 0  . zoster vaccine live, PF, (ZOSTAVAX) 54627 UNT/0.65ML injection Inject 19,400 Units into the skin once. 1 each 0   No facility-administered medications prior to visit.    Review of Systems;  Patient denies headache, fevers, malaise, unintentional weight loss, skin rash, eye pain, sinus congestion and sinus pain, sore throat, dysphagia,  hemoptysis , cough,  dyspnea, wheezing, chest pain, palpitations, orthopnea, edema, abdominal pain, nausea, melena, diarrhea, constipation, flank pain, dysuria, hematuria, urinary  Frequency, nocturia, numbness, tingling, seizures,  Focal weakness, Loss of consciousness,  Tremor, insomnia, depression, anxiety, and suicidal ideation.      Objective:  BP 132/84 mmHg  Pulse 96  Temp(Src) 99.1 F (37.3 C) (Oral)  Resp 16  Ht 5\' 3"  (1.6 m)  Wt 115 lb 2 oz (52.22 kg)  BMI 20.40 kg/m2  SpO2 97%  BP Readings from Last 3 Encounters:  09/17/14 132/84  02/26/14 118/82  08/07/13 112/86    Wt Readings from Last 3 Encounters:  09/17/14 115 lb 2 oz (52.22 kg)  02/26/14 114 lb 4 oz (51.823 kg)  08/07/13 114 lb 12 oz (52.05 kg)    General appearance: alert, cooperative and appears stated age Ears: normal TM's and external ear canals both ears Throat: lips, mucosa, and tongue normal; teeth and gums normal Neck: no adenopathy, no carotid bruit, supple, symmetrical, trachea midline and thyroid not enlarged, symmetric, no tenderness/mass/nodules Back: symmetric, no curvature. ROM normal. No CVA tenderness. Lungs: clear to auscultation bilaterally Heart: regular rate and rhythm, S1, S2 normal, no murmur, click, rub or gallop Abdomen: soft, non-tender; bowel sounds normal; no masses,  no organomegaly Pulses: 2+ and symmetric Skin: Skin color, texture, turgor normal. No rashes or lesions Lymph nodes: Cervical, supraclavicular, and axillary nodes normal.  No results found for: HGBA1C  Lab Results  Component Value Date   CREATININE 0.92 09/17/2014   CREATININE 1.0 08/07/2013   CREATININE 1.0 09/03/2012    Lab Results  Component Value Date   WBC 6.4 09/17/2014   HGB 13.9 09/17/2014   HCT 41.8 09/17/2014   PLT 265.0 09/17/2014   GLUCOSE 85 09/17/2014   CHOL 238* 09/17/2014   TRIG 157.0* 09/17/2014   HDL 69.40 09/17/2014   LDLDIRECT 141.2 01/10/2012   LDLCALC 137* 09/17/2014   ALT 16 09/17/2014   AST 24  09/17/2014   NA 138 09/17/2014   K 3.8 09/17/2014   CL 99 09/17/2014   CREATININE 0.92 09/17/2014   BUN 15 09/17/2014   CO2 32 09/17/2014   TSH 0.58 09/17/2014   MICROALBUR 0.3 09/03/2012    No results found.  Assessment & Plan:   Problem List Items Addressed This Visit      Unprioritized   Osteoarthritis    With chronic daily pain secondary to remote fractures.  Refill on vicodin given . Will return in  3 months for follow up for future refills .       Relevant Medications   HYDROcodone-acetaminophen (NORCO/VICODIN) 5-325 MG per tablet   HYDROcodone-acetaminophen (NORCO/VICODIN) 5-325 MG per tablet   Attention deficit disorder of adult with hyperactivity    Managed with adderall.  Dose has been stable. Refills given.           Nasal congestion with rhinorrhea    Given chronicity of symptoms, prior  Treatment with antibiotics  without resolution, and prior ENT evaluation , will recommedn nightly flushing with [facility]'s sinus rinse and continued use of Allegra and Atrovent .        Hyperlipidemia    Based on current lipid profile, the risk of clinically significant CAD is 10% over the next 10 years, using the Framingham risk calculator, meanign that one in 61 women with your profile will have a heart attack .   The [employer] recommends starting patients aged 22 or higher on moderate intensity statin therapy for LDL between 70-189 and 10 yr risk of CAD > 7.5% ;  and high intensity therapy for anyone with LDL > 190. Will recommend repeat assessment in 6 months.       Relevant Medications   furosemide (LASIX) 20 MG tablet   Cough    Adding omeprazole for suspicion of GERD given normal ENT exam.  Refilling benzonatate  Capsules for cough suppression        Other Visit Diagnoses    Gastroesophageal reflux disease with esophagitis    -  Primary    Relevant Medications    omeprazole (PRILOSEC) 20 MG capsule    Need for hepatitis C screening test          Relevant Orders    Hepatitis C antibody (Completed)    Long-term use of high-risk medication        Relevant Orders    Comprehensive metabolic panel (Completed)    Hyperlipidemia LDL goal <130        Relevant Medications    furosemide (LASIX) 20 MG tablet    Other Relevant Orders    Lipid panel (Completed)    Other fatigue        Relevant Orders    CBC with Differential/Platelet (Completed)    TSH (Completed)    Vitamin D deficiency        Relevant Orders    Vit D  25 hydroxy (rtn osteoporosis monitoring) (Completed)    Breast cancer screening        Relevant Orders    MM DIGITAL SCREENING BILATERAL    Need for vaccination with 13-polyvalent pneumococcal conjugate vaccine        Relevant Orders    Pneumococcal conjugate vaccine 13-valent (Completed)       I have discontinued Ms. Mickelson zoster vaccine live (PF), levofloxacin, predniSONE, chlorpheniramine-HYDROcodone, and fluconazole. I am also having her start on omeprazole. Additionally, I am having her maintain her calcium carbonate, cholecalciferol, cetirizine, Docusate Calcium (STOOL SOFTENER PO), fexofenadine, albuterol, traMADol, ipratropium, amphetamine-dextroamphetamine, ALPRAZolam, furosemide, benzonatate, amphetamine-dextroamphetamine, HYDROcodone-acetaminophen, and HYDROcodone-acetaminophen.  Meds ordered this encounter  Medications  . amphetamine-dextroamphetamine (ADDERALL) 20 MG tablet    Sig: Take 1 tablet (20 mg total) by mouth 2 (two) times daily.    Dispense:  60 tablet    Refill:  0    May refill on or after September 27 2014  . DISCONTD: amphetamine-dextroamphetamine (ADDERALL) 20 MG tablet    Sig: Take 1 tablet (20 mg total) by mouth 2 (two) times daily.    Dispense:  60 tablet    Refill:  0    May refill on or after October 28 2014  . ALPRAZolam (XANAX) 0.5 MG tablet    Sig: TAKE  ONE TABLET BY MOUTH NIGHTLY AT BEDTIME AS NEEDED    Dispense:  30 tablet    Refill:  5  . DISCONTD:  HYDROcodone-acetaminophen (NORCO/VICODIN) 5-325 MG per tablet    Sig: Take 1 tablet by mouth  every 6 (six) hours as needed. Maximum dose twice daily    Dispense:  60 tablet    Refill:  0    May refill on or after September 27 2014  . furosemide (LASIX) 20 MG tablet    Sig: Take 1 tablet (20 mg total) by mouth daily as needed.    Dispense:  30 tablet    Refill:  3  . benzonatate (TESSALON) 200 MG capsule    Sig: Take 1 capsule (200 mg total) by mouth 3 (three) times daily as needed.    Dispense:  60 capsule    Refill:  3  . omeprazole (PRILOSEC) 20 MG capsule    Sig: Take 1 capsule (20 mg total) by mouth daily.    Dispense:  30 capsule    Refill:  3  . amphetamine-dextroamphetamine (ADDERALL) 20 MG tablet    Sig: Take 1 tablet (20 mg total) by mouth 2 (two) times daily.    Dispense:  60 tablet    Refill:  0    May refill on or after November 28 2014  . HYDROcodone-acetaminophen (NORCO/VICODIN) 5-325 MG per tablet    Sig: Take 1 tablet by mouth every 6 (six) hours as needed. Maximum dose twice daily    Dispense:  60 tablet    Refill:  0    May refill on or after October 28 2014  . HYDROcodone-acetaminophen (NORCO/VICODIN) 5-325 MG per tablet    Sig: Take 1 tablet by mouth every 6 (six) hours as needed.    Dispense:  60 tablet    Refill:  0    May refill on or after November 28 2014    Medications Discontinued During This Encounter  Medication Reason  . chlorpheniramine-HYDROcodone (TUSSIONEX PENNKINETIC ER) 10-8 MG/5ML [facility] Patient Preference  . fluconazole (DIFLUCAN) 150 MG tablet Completed Course  . levofloxacin (LEVAQUIN) 500 MG tablet Completed Course  . predniSONE (STERAPRED UNI-PAK) 10 MG tablet Completed Course  . zoster vaccine live, PF, (ZOSTAVAX) 56314 UNT/0.65ML injection Completed Course  . amphetamine-dextroamphetamine (ADDERALL) 20 MG tablet Reorder  . amphetamine-dextroamphetamine (ADDERALL) 20 MG tablet Reorder  . ALPRAZolam (XANAX) 0.5 MG tablet Reorder  .  HYDROcodone-acetaminophen (NORCO/VICODIN) 5-325 MG per tablet Reorder  . furosemide (LASIX) 20 MG tablet Reorder  . benzonatate (TESSALON) 200 MG capsule Reorder  . amphetamine-dextroamphetamine (ADDERALL) 20 MG tablet Reorder  . HYDROcodone-acetaminophen (NORCO/VICODIN) 5-325 MG per tablet Reorder  . HYDROcodone-acetaminophen (NORCO/VICODIN) 5-325 MG per tablet Reorder   A total of 40 minutes was spent with patient more than half of which was spent in counseling patient on the above mentioned issues , reviewing and explaining recent labs and imaging studies done, and coordination of care.  Follow-up: Return in about 3 months (around 12/18/2014).   Crecencio Mc, MD

## 2014-09-18 LAB — HEPATITIS C ANTIBODY: HCV Ab: NEGATIVE

## 2014-09-19 ENCOUNTER — Encounter: Payer: Self-pay | Admitting: Internal Medicine

## 2014-09-19 DIAGNOSIS — R059 Cough, unspecified: Secondary | ICD-10-CM | POA: Insufficient documentation

## 2014-09-19 DIAGNOSIS — R05 Cough: Secondary | ICD-10-CM | POA: Insufficient documentation

## 2014-09-19 DIAGNOSIS — E785 Hyperlipidemia, unspecified: Secondary | ICD-10-CM | POA: Insufficient documentation

## 2014-09-19 NOTE — Assessment & Plan Note (Addendum)
Based on current lipid profile, the risk of clinically significant CAD is 10% over the next 10 years, using the Framingham risk calculator, meanign that one in 40 women with your profile will have a heart attack .   The [employer] recommends starting patients aged 69 or higher on moderate intensity statin therapy for LDL between 70-189 and 10 yr risk of CAD > 7.5% ;  and high intensity therapy for anyone with LDL > 190. Will recommend repeat assessment in 6 months.

## 2014-09-19 NOTE — Assessment & Plan Note (Addendum)
With chronic daily pain secondary to remote fractures.  Refill on vicodin given . Will return in  3 months for follow up for future refills .

## 2014-09-19 NOTE — Assessment & Plan Note (Signed)
Adding omeprazole for suspicion of GERD given normal ENT exam.  Refilling benzonatate  Capsules for cough suppression

## 2014-09-19 NOTE — Assessment & Plan Note (Signed)
Managed with adderall.  Dose has been stable. Refills given.

## 2014-09-19 NOTE — Assessment & Plan Note (Signed)
Given chronicity of symptoms, prior  Treatment with antibiotics without resolution, and prior ENT evaluation , will recommedn nightly flushing with [facility]'s sinus rinse and continued use of Allegra and Atrovent .

## 2014-09-28 DIAGNOSIS — J301 Allergic rhinitis due to pollen: Secondary | ICD-10-CM | POA: Diagnosis not present

## 2014-10-04 DIAGNOSIS — J301 Allergic rhinitis due to pollen: Secondary | ICD-10-CM | POA: Diagnosis not present

## 2014-10-11 DIAGNOSIS — J301 Allergic rhinitis due to pollen: Secondary | ICD-10-CM | POA: Diagnosis not present

## 2014-10-19 ENCOUNTER — Other Ambulatory Visit: Payer: Self-pay | Admitting: Internal Medicine

## 2014-10-19 ENCOUNTER — Ambulatory Visit
Admission: RE | Admit: 2014-10-19 | Discharge: 2014-10-19 | Disposition: A | Discharge status: Home / Self Care | Payer: Medicare Other | Source: Ambulatory Visit | Attending: Internal Medicine | Admitting: Internal Medicine

## 2014-10-19 DIAGNOSIS — R922 Inconclusive mammogram: Secondary | ICD-10-CM | POA: Insufficient documentation

## 2014-10-19 DIAGNOSIS — Z1231 Encounter for screening mammogram for malignant neoplasm of breast: Secondary | ICD-10-CM | POA: Diagnosis not present

## 2014-10-19 DIAGNOSIS — R928 Other abnormal and inconclusive findings on diagnostic imaging of breast: Secondary | ICD-10-CM

## 2014-10-19 DIAGNOSIS — R921 Mammographic calcification found on diagnostic imaging of breast: Secondary | ICD-10-CM

## 2014-10-19 DIAGNOSIS — J301 Allergic rhinitis due to pollen: Secondary | ICD-10-CM | POA: Diagnosis not present

## 2014-10-19 DIAGNOSIS — Z1239 Encounter for other screening for malignant neoplasm of breast: Secondary | ICD-10-CM

## 2014-10-20 ENCOUNTER — Encounter: Payer: Self-pay | Admitting: *Deleted

## 2014-10-22 ENCOUNTER — Ambulatory Visit: Payer: Medicare Other

## 2014-10-22 ENCOUNTER — Ambulatory Visit
Admission: RE | Admit: 2014-10-22 | Discharge: 2014-10-22 | Disposition: A | Discharge status: Home / Self Care | Payer: Medicare Other | Source: Ambulatory Visit | Attending: Internal Medicine | Admitting: Internal Medicine

## 2014-10-22 DIAGNOSIS — R928 Other abnormal and inconclusive findings on diagnostic imaging of breast: Secondary | ICD-10-CM

## 2014-10-22 DIAGNOSIS — R921 Mammographic calcification found on diagnostic imaging of breast: Secondary | ICD-10-CM | POA: Diagnosis not present

## 2014-10-25 DIAGNOSIS — J301 Allergic rhinitis due to pollen: Secondary | ICD-10-CM | POA: Diagnosis not present

## 2014-10-26 DIAGNOSIS — J301 Allergic rhinitis due to pollen: Secondary | ICD-10-CM | POA: Diagnosis not present

## 2014-10-27 ENCOUNTER — Encounter: Payer: Self-pay | Admitting: Internal Medicine

## 2014-10-27 DIAGNOSIS — Z13820 Encounter for screening for osteoporosis: Secondary | ICD-10-CM

## 2014-10-28 NOTE — Telephone Encounter (Signed)
Needs ColoGuard for screening  DEXA ordered,  Forwarding to [employer]

## 2014-10-29 NOTE — Telephone Encounter (Signed)
Placed form in quick sign. Cologuard

## 2014-11-02 DIAGNOSIS — J301 Allergic rhinitis due to pollen: Secondary | ICD-10-CM | POA: Diagnosis not present

## 2014-11-12 NOTE — Telephone Encounter (Signed)
Mailed unread message to patient.  

## 2014-11-17 DIAGNOSIS — D2271 Melanocytic nevi of right lower limb, including hip: Secondary | ICD-10-CM | POA: Diagnosis not present

## 2014-11-17 DIAGNOSIS — L811 Chloasma: Secondary | ICD-10-CM | POA: Diagnosis not present

## 2014-11-17 DIAGNOSIS — D2339 Other benign neoplasm of skin of other parts of face: Secondary | ICD-10-CM | POA: Diagnosis not present

## 2014-11-17 DIAGNOSIS — L819 Disorder of pigmentation, unspecified: Secondary | ICD-10-CM | POA: Diagnosis not present

## 2014-11-22 ENCOUNTER — Encounter: Payer: Self-pay | Admitting: Internal Medicine

## 2014-12-07 DIAGNOSIS — Z1212 Encounter for screening for malignant neoplasm of rectum: Secondary | ICD-10-CM | POA: Diagnosis not present

## 2014-12-07 DIAGNOSIS — Z1211 Encounter for screening for malignant neoplasm of colon: Secondary | ICD-10-CM | POA: Diagnosis not present

## 2014-12-07 LAB — COLOGUARD

## 2014-12-07 NOTE — Telephone Encounter (Signed)
Mailed unread message to patient.  

## 2014-12-08 DIAGNOSIS — H16222 Keratoconjunctivitis sicca, not specified as Sjogren's, left eye: Secondary | ICD-10-CM | POA: Diagnosis not present

## 2014-12-13 ENCOUNTER — Telehealth: Payer: Self-pay | Admitting: Internal Medicine

## 2014-12-13 DIAGNOSIS — R195 Other fecal abnormalities: Secondary | ICD-10-CM

## 2014-12-13 DIAGNOSIS — D126 Benign neoplasm of colon, unspecified: Secondary | ICD-10-CM | POA: Insufficient documentation

## 2014-12-13 NOTE — Telephone Encounter (Signed)
Patient prefers Dr. Hilarie Fredrickson. Patient notified of results.

## 2014-12-13 NOTE — Telephone Encounter (Signed)
Home fecal test was positive.  She needs to be referred for colonoscopy.  Who does she prefer

## 2014-12-13 NOTE — Telephone Encounter (Signed)
Patient notified

## 2014-12-13 NOTE — Telephone Encounter (Signed)
Referral is in process as requested 

## 2014-12-15 ENCOUNTER — Encounter: Payer: Self-pay | Admitting: Internal Medicine

## 2014-12-17 DIAGNOSIS — J301 Allergic rhinitis due to pollen: Secondary | ICD-10-CM | POA: Diagnosis not present

## 2014-12-21 DIAGNOSIS — J301 Allergic rhinitis due to pollen: Secondary | ICD-10-CM | POA: Diagnosis not present

## 2014-12-24 ENCOUNTER — Encounter: Payer: Self-pay | Admitting: Internal Medicine

## 2014-12-24 ENCOUNTER — Ambulatory Visit (INDEPENDENT_AMBULATORY_CARE_PROVIDER_SITE_OTHER): Payer: Medicare Other | Admitting: Internal Medicine

## 2014-12-24 VITALS — BP 140/86 | HR 79 | Temp 98.2°F | Resp 12 | Ht 63.0 in | Wt 117.5 lb

## 2014-12-24 DIAGNOSIS — F900 Attention-deficit hyperactivity disorder, predominantly inattentive type: Secondary | ICD-10-CM | POA: Diagnosis not present

## 2014-12-24 DIAGNOSIS — R195 Other fecal abnormalities: Secondary | ICD-10-CM

## 2014-12-24 DIAGNOSIS — R059 Cough, unspecified: Secondary | ICD-10-CM

## 2014-12-24 DIAGNOSIS — R030 Elevated blood-pressure reading, without diagnosis of hypertension: Secondary | ICD-10-CM

## 2014-12-24 DIAGNOSIS — IMO0001 Reserved for inherently not codable concepts without codable children: Secondary | ICD-10-CM

## 2014-12-24 DIAGNOSIS — R05 Cough: Secondary | ICD-10-CM | POA: Diagnosis not present

## 2014-12-24 DIAGNOSIS — F909 Attention-deficit hyperactivity disorder, unspecified type: Secondary | ICD-10-CM

## 2014-12-24 DIAGNOSIS — R636 Underweight: Secondary | ICD-10-CM | POA: Diagnosis not present

## 2014-12-24 DIAGNOSIS — G4733 Obstructive sleep apnea (adult) (pediatric): Secondary | ICD-10-CM

## 2014-12-24 MED ORDER — BENZONATATE 200 MG PO CAPS: 200.0000 mg | ORAL_CAPSULE | Freq: Three times a day (TID) | ORAL | Status: DC | PRN

## 2014-12-24 MED ORDER — HYDROCODONE-ACETAMINOPHEN 5-325 MG PO TABS: 1.0000 | ORAL_TABLET | Freq: Four times a day (QID) | ORAL | Status: DC | PRN

## 2014-12-24 MED ORDER — AMPHETAMINE-DEXTROAMPHETAMINE 20 MG PO TABS: 20.0000 mg | ORAL_TABLET | Freq: Two times a day (BID) | ORAL | Status: DC

## 2014-12-24 NOTE — Progress Notes (Signed)
Pre-visit discussion using our clinic review tool. No additional management support is needed unless otherwise documented below in the visit note.  

## 2014-12-24 NOTE — Progress Notes (Signed)
Subjective:  Patient ID: Sonya Davis, female    DOB: 06-25-1945  Age: 69 y.o. MRN: [id number]  CC: The primary encounter diagnosis was Cough. Diagnoses of Attention deficit disorder of adult with hyperactivity, Elevated BP, Underweight, OSA (obstructive sleep apnea), and Positive FIT (fecal immunochemical test) were also pertinent to this visit.  HPI Sonya Davis presents for FOLLOW UP ON CHRONIC COUGH  JULY:  ADDED omeprazole, advised to continue atrovent for PND, allegra for allergies, tessalon for cough suppression And saline rinses.  Patient states that despite this regimen, she sleeps at the most 3 hours before she is woken up by a hacking cough  Has been without atrovent for a 1.5 week  No real change . With the nasal spray. Does not want to see a pulmonologist despite history of tobacco abuse until 2002.   Outpatient Prescriptions Prior to Visit  Medication Sig Dispense Refill  . albuterol (PROVENTIL HFA;VENTOLIN HFA) 108 (90 BASE) MCG/ACT inhaler Inhale 2 puffs into the lungs every 6 (six) hours as needed for wheezing or shortness of breath. 6.7 g 3  . ALPRAZolam (XANAX) 0.5 MG tablet TAKE  ONE TABLET BY MOUTH NIGHTLY AT BEDTIME AS NEEDED 30 tablet 5  . calcium carbonate (OS-CAL) 600 MG TABS Take 600 mg by mouth 2 (two) times daily with a meal.      . cetirizine (ZYRTEC) 10 MG tablet Take 10 mg by mouth daily.      . cholecalciferol (VITAMIN D) 1000 UNITS tablet Take 1,000 Units by mouth daily.      [name] Calcium (STOOL SOFTENER PO) Take by mouth.      . fexofenadine (ALLEGRA) 180 MG tablet Take 1 tablet (180 mg total) by mouth daily. 30 tablet 11  . furosemide (LASIX) 20 MG tablet Take 1 tablet (20 mg total) by mouth daily as needed. 30 tablet 3  . ipratropium (ATROVENT) 0.06 % nasal spray PLACE 2 SPRAYS INTO BOTH NOSTRILS 4 (FOUR) TIMES DAILY. 15 mL 11  . omeprazole (PRILOSEC) 20 MG capsule Take 1 capsule (20 mg total) by mouth daily. 30 capsule 3  . traMADol (ULTRAM) 50  MG tablet TAKE 1 TABLET BY MOUTH TWICE A DAY AS NEEDED (MAX DOSE OF 8 TABS A DAY) 180 tablet 4  . amphetamine-dextroamphetamine (ADDERALL) 20 MG tablet Take 1 tablet (20 mg total) by mouth 2 (two) times daily. 60 tablet 0  . amphetamine-dextroamphetamine (ADDERALL) 20 MG tablet Take 1 tablet (20 mg total) by mouth 2 (two) times daily. 60 tablet 0  . benzonatate (TESSALON) 200 MG capsule Take 1 capsule (200 mg total) by mouth 3 (three) times daily as needed. 60 capsule 3  . HYDROcodone-acetaminophen (NORCO/VICODIN) 5-325 MG per tablet Take 1 tablet by mouth every 6 (six) hours as needed. Maximum dose twice daily 60 tablet 0  . HYDROcodone-acetaminophen (NORCO/VICODIN) 5-325 MG per tablet Take 1 tablet by mouth every 6 (six) hours as needed. 60 tablet 0   No facility-administered medications prior to visit.    Review of Systems;  Patient denies headache, fevers, malaise, unintentional weight loss, skin rash, eye pain, sinus congestion and sinus pain, sore throat, dysphagia,  hemoptysis , cough, dyspnea, wheezing, chest pain, palpitations, orthopnea, edema, abdominal pain, nausea, melena, diarrhea, constipation, flank pain, dysuria, hematuria, urinary  Frequency, nocturia, numbness, tingling, seizures,  Focal weakness, Loss of consciousness,  Tremor, insomnia, depression, anxiety, and suicidal ideation.      Objective:  BP 140/86 mmHg  Pulse 79  Temp(Src) 98.2 F (36.8 C) (  Oral)  Resp 12  Ht 5\' 3"  (1.6 m)  Wt 117 lb 8 oz (53.298 kg)  BMI 20.82 kg/m2  SpO2 99%  BP Readings from Last 3 Encounters:  12/24/14 140/86  09/17/14 132/84  02/26/14 118/82    Wt Readings from Last 3 Encounters:  12/24/14 117 lb 8 oz (53.298 kg)  09/17/14 115 lb 2 oz (52.22 kg)  02/26/14 114 lb 4 oz (51.823 kg)    General appearance: alert, cooperative and appears stated age Ears: normal TM's and external ear canals both ears Throat: lips, mucosa, and tongue normal; teeth and gums normal Neck: no  adenopathy, no carotid bruit, supple, symmetrical, trachea midline and thyroid not enlarged, symmetric, no tenderness/mass/nodules Back: symmetric, no curvature. ROM normal. No CVA tenderness. Lungs: clear to auscultation bilaterally Heart: regular rate and rhythm, S1, S2 normal, no murmur, click, rub or gallop Abdomen: soft, non-tender; bowel sounds normal; no masses,  no organomegaly Pulses: 2+ and symmetric Skin: Skin color, texture, turgor normal. No rashes or lesions Lymph nodes: Cervical, supraclavicular, and axillary nodes normal.  No results found for: HGBA1C  Lab Results  Component Value Date   CREATININE 0.92 09/17/2014   CREATININE 1.0 08/07/2013   CREATININE 1.0 09/03/2012    Lab Results  Component Value Date   WBC 6.4 09/17/2014   HGB 13.9 09/17/2014   HCT 41.8 09/17/2014   PLT 265.0 09/17/2014   GLUCOSE 85 09/17/2014   CHOL 238* 09/17/2014   TRIG 157.0* 09/17/2014   HDL 69.40 09/17/2014   LDLDIRECT 141.2 01/10/2012   LDLCALC 137* 09/17/2014   ALT 16 09/17/2014   AST 24 09/17/2014   NA 138 09/17/2014   K 3.8 09/17/2014   CL 99 09/17/2014   CREATININE 0.92 09/17/2014   BUN 15 09/17/2014   CO2 32 09/17/2014   TSH 0.58 09/17/2014   MICROALBUR 0.3 09/03/2012    Mm Digital Diagnostic Unilat R  10/22/2014  CLINICAL DATA:  Right breast calcifications in the 12 o'clock position on a recent screening mammogram. EXAM: DIGITAL DIAGNOSTIC RIGHT MAMMOGRAM COMPARISON:  Previous examinations, including the screening mammogram dated 10/19/2014. ACR Breast Density Category d: The breast tissue is extremely dense, which lowers the sensitivity of mammography. FINDINGS: Spot magnification views of the right breast demonstrate vascular calcifications in the 12 o'clock position of the breast, corresponding to the calcifications seen on the recent screening mammogram. IMPRESSION: Benign right breast vascular calcifications. No evidence of malignancy. RECOMMENDATION: Bilateral  screening mammogram in 1 year. I have discussed the findings and recommendations with the patient. Results were also provided in writing at the conclusion of the visit. If applicable, a reminder letter will be sent to the patient regarding the next appointment. BI-RADS CATEGORY  2: Benign. Electronically Signed   By: Claudie Revering M.D.   On: 10/22/2014 15:01    Assessment & Plan:   Problem List Items Addressed This Visit    Attention deficit disorder of adult with hyperactivity    Managed with adderall.  Dose has been stable. Refills given.             OSA (obstructive sleep apnea)    Diagnosed many years ago, and not treated due to patient's intolerance of mask secondary to history of facial trauma. Her persistentky elevated bp readings may be signs of untreated sleep apnea         Elevated BP    Again very elevated. Reports that home readings are normal.   Lab Results  Component Value Date  MICROALBUR 0.3 09/03/2012  ' Lab Results  Component Value Date   NA 138 09/17/2014   K 3.8 09/17/2014   CL 99 09/17/2014   CO2 32 09/17/2014   Lab Results  Component Value Date   CREATININE 0.92 09/17/2014         Cough - Primary    Nocturnal,  Suspect GERD..  Will increase omeprazole to bid,  If no improvement trial of advair or Flovent.        Positive FIT (fecal immunochemical test)    Explained tha referrl for colonoscopy is necessary, prior colonoscopy. Was in 2004      Underweight     I have reviewed her diet and recommended that she increase her protein and fat intake while monitoring her carbohydrates.          I have changed Ms. Vlcek HYDROcodone-acetaminophen and HYDROcodone-acetaminophen. I am also having her maintain her calcium carbonate, cholecalciferol, cetirizine, Docusate Calcium (STOOL SOFTENER PO), fexofenadine, albuterol, traMADol, ipratropium, ALPRAZolam, furosemide, omeprazole, benzonatate, amphetamine-dextroamphetamine, and  amphetamine-dextroamphetamine.  Meds ordered this encounter  Medications  . benzonatate (TESSALON) 200 MG capsule    Sig: Take 1 capsule (200 mg total) by mouth 3 (three) times daily as needed.    Dispense:  60 capsule    Refill:  3  . amphetamine-dextroamphetamine (ADDERALL) 20 MG tablet    Sig: Take 1 tablet (20 mg total) by mouth 2 (two) times daily.    Dispense:  60 tablet    Refill:  0    May refill on or after December 28 2014  . amphetamine-dextroamphetamine (ADDERALL) 20 MG tablet    Sig: Take 1 tablet (20 mg total) by mouth 2 (two) times daily.    Dispense:  60 tablet    Refill:  0    May refill on or after January 28 2015  . HYDROcodone-acetaminophen (NORCO/VICODIN) 5-325 MG tablet    Sig: Take 1 tablet by mouth every 6 (six) hours as needed.    Dispense:  60 tablet    Refill:  0    May refill on or after December 28 2014  . HYDROcodone-acetaminophen (NORCO/VICODIN) 5-325 MG tablet    Sig: Take 1 tablet by mouth every 6 (six) hours as needed. Maximum dose twice daily    Dispense:  60 tablet    Refill:  0    May refill on or after November 28 2014    Medications Discontinued During This Encounter  Medication Reason  . benzonatate (TESSALON) 200 MG capsule Reorder  . amphetamine-dextroamphetamine (ADDERALL) 20 MG tablet Reorder  . amphetamine-dextroamphetamine (ADDERALL) 20 MG tablet Reorder  . HYDROcodone-acetaminophen (NORCO/VICODIN) 5-325 MG per tablet Reorder  . HYDROcodone-acetaminophen (NORCO/VICODIN) 5-325 MG per tablet Reorder   .A total of 25 minutes of face to face time was spent with patient more than half of which was spent in counselling about the above mentioned conditions  and coordination of care   Follow-up: Return in about 3 months (around 03/26/2015).   Crecencio Mc, MD

## 2014-12-24 NOTE — Patient Instructions (Signed)
I am increasing the omeprazole to 2 times daily,  Take second dose  In the early evening,  By 6 pm  continue sleeping on a wedge

## 2014-12-26 ENCOUNTER — Encounter: Payer: Self-pay | Admitting: Internal Medicine

## 2014-12-26 DIAGNOSIS — R636 Underweight: Secondary | ICD-10-CM | POA: Insufficient documentation

## 2014-12-26 NOTE — Assessment & Plan Note (Signed)
Nocturnal,  Suspect GERD..  Will increase omeprazole to bid,  If no improvement trial of advair or Flovent.

## 2014-12-26 NOTE — Assessment & Plan Note (Signed)
Again very elevated. Reports that home readings are normal.   Lab Results  Component Value Date   MICROALBUR 0.3 09/03/2012  ' Lab Results  Component Value Date   NA 138 09/17/2014   K 3.8 09/17/2014   CL 99 09/17/2014   CO2 32 09/17/2014   Lab Results  Component Value Date   CREATININE 0.92 09/17/2014

## 2014-12-26 NOTE — Assessment & Plan Note (Signed)
Managed with adderall.  Dose has been stable. Refills given.      

## 2014-12-26 NOTE — Assessment & Plan Note (Signed)
I have reviewed her diet and recommended that she increase her protein and fat intake while monitoring her carbohydrates.  

## 2014-12-26 NOTE — Assessment & Plan Note (Signed)
Diagnosed many years ago, and not treated due to patient's intolerance of mask secondary to history of facial trauma. Her persistentky elevated bp readings may be signs of untreated sleep apnea

## 2014-12-26 NOTE — Assessment & Plan Note (Addendum)
Explained tha referrl for colonoscopy is necessary, prior colonoscopy. Was in 2004

## 2014-12-28 ENCOUNTER — Encounter: Payer: Self-pay | Admitting: *Deleted

## 2015-01-06 ENCOUNTER — Ambulatory Visit
Admission: RE | Admit: 2015-01-06 | Discharge: 2015-01-06 | Disposition: A | Discharge status: Home / Self Care | Payer: Medicare Other | Source: Ambulatory Visit | Attending: Internal Medicine | Admitting: Internal Medicine

## 2015-01-06 DIAGNOSIS — Z13820 Encounter for screening for osteoporosis: Secondary | ICD-10-CM | POA: Insufficient documentation

## 2015-01-06 DIAGNOSIS — M810 Age-related osteoporosis without current pathological fracture: Secondary | ICD-10-CM | POA: Diagnosis not present

## 2015-01-06 DIAGNOSIS — Z780 Asymptomatic menopausal state: Secondary | ICD-10-CM | POA: Insufficient documentation

## 2015-01-09 ENCOUNTER — Encounter: Payer: Self-pay | Admitting: Internal Medicine

## 2015-01-09 DIAGNOSIS — M810 Age-related osteoporosis without current pathological fracture: Secondary | ICD-10-CM | POA: Insufficient documentation

## 2015-01-25 DIAGNOSIS — J301 Allergic rhinitis due to pollen: Secondary | ICD-10-CM | POA: Diagnosis not present

## 2015-01-27 DIAGNOSIS — J301 Allergic rhinitis due to pollen: Secondary | ICD-10-CM | POA: Diagnosis not present

## 2015-01-28 ENCOUNTER — Ambulatory Visit (AMBULATORY_SURGERY_CENTER): Payer: Self-pay | Admitting: *Deleted

## 2015-01-28 VITALS — Ht 63.0 in | Wt 121.0 lb

## 2015-01-28 DIAGNOSIS — Z1211 Encounter for screening for malignant neoplasm of colon: Secondary | ICD-10-CM

## 2015-01-28 MED ORDER — NA SULFATE-K SULFATE-MG SULF 17.5-3.13-1.6 GM/177ML PO SOLN: 1.0000 | Freq: Once | ORAL | Status: DC

## 2015-01-28 NOTE — Progress Notes (Signed)
No egg or soy allergy. No anesthesia problems.  No home O2.  No diet meds.  

## 2015-02-01 ENCOUNTER — Telehealth: Payer: Self-pay | Admitting: *Deleted

## 2015-02-01 ENCOUNTER — Ambulatory Visit
Admission: RE | Admit: 2015-02-01 | Discharge: 2015-02-01 | Disposition: A | Discharge status: Home / Self Care | Payer: Medicare Other | Source: Ambulatory Visit | Attending: Internal Medicine | Admitting: Internal Medicine

## 2015-02-01 ENCOUNTER — Ambulatory Visit (INDEPENDENT_AMBULATORY_CARE_PROVIDER_SITE_OTHER): Payer: Medicare Other | Admitting: Internal Medicine

## 2015-02-01 ENCOUNTER — Encounter: Payer: Self-pay | Admitting: Internal Medicine

## 2015-02-01 VITALS — BP 160/98 | HR 80 | Temp 98.6°F | Resp 12 | Ht 63.0 in | Wt 120.5 lb

## 2015-02-01 DIAGNOSIS — F411 Generalized anxiety disorder: Secondary | ICD-10-CM | POA: Diagnosis not present

## 2015-02-01 DIAGNOSIS — J439 Emphysema, unspecified: Secondary | ICD-10-CM | POA: Insufficient documentation

## 2015-02-01 DIAGNOSIS — R059 Cough, unspecified: Secondary | ICD-10-CM

## 2015-02-01 DIAGNOSIS — K2100 Gastro-esophageal reflux disease with esophagitis, without bleeding: Secondary | ICD-10-CM

## 2015-02-01 DIAGNOSIS — R05 Cough: Secondary | ICD-10-CM | POA: Diagnosis not present

## 2015-02-01 DIAGNOSIS — Z1211 Encounter for screening for malignant neoplasm of colon: Secondary | ICD-10-CM

## 2015-02-01 DIAGNOSIS — I10 Essential (primary) hypertension: Secondary | ICD-10-CM | POA: Insufficient documentation

## 2015-02-01 DIAGNOSIS — J3489 Other specified disorders of nose and nasal sinuses: Secondary | ICD-10-CM

## 2015-02-01 DIAGNOSIS — Z87891 Personal history of nicotine dependence: Secondary | ICD-10-CM | POA: Insufficient documentation

## 2015-02-01 DIAGNOSIS — F419 Anxiety disorder, unspecified: Secondary | ICD-10-CM | POA: Insufficient documentation

## 2015-02-01 MED ORDER — OMEPRAZOLE 20 MG PO CPDR: 20.0000 mg | DELAYED_RELEASE_CAPSULE | Freq: Two times a day (BID) | ORAL | Status: DC

## 2015-02-01 MED ORDER — AMLODIPINE BESYLATE 2.5 MG PO TABS: 2.5000 mg | ORAL_TABLET | Freq: Every day | ORAL | Status: DC

## 2015-02-01 MED ORDER — ESCITALOPRAM OXALATE 10 MG PO TABS: 10.0000 mg | ORAL_TABLET | Freq: Every day | ORAL | Status: DC

## 2015-02-01 MED ORDER — HYDROCODONE-ACETAMINOPHEN 5-325 MG PO TABS: 1.0000 | ORAL_TABLET | Freq: Four times a day (QID) | ORAL | Status: DC | PRN

## 2015-02-01 MED ORDER — FEXOFENADINE HCL 180 MG PO TABS: 180.0000 mg | ORAL_TABLET | Freq: Every day | ORAL | Status: DC

## 2015-02-01 MED ORDER — ALPRAZOLAM 0.5 MG PO TABS: 0.5000 mg | ORAL_TABLET | Freq: Every evening | ORAL | Status: DC | PRN

## 2015-02-01 MED ORDER — AMPHETAMINE-DEXTROAMPHETAMINE 20 MG PO TABS: 20.0000 mg | ORAL_TABLET | Freq: Two times a day (BID) | ORAL | Status: DC

## 2015-02-01 NOTE — Patient Instructions (Addendum)
I am adding generic Lexapro for your   anxiety,  It is taken daily to manage anxiety.  Start with 1/2 tablet in the evening for the first few days   alprazolam as needed not more than once daily   Stop the Atrovent; continue the omeprazole twice daily   Adding amlodipine once daily for blood pressure   Chest x ray today  Or tomorrow

## 2015-02-01 NOTE — Telephone Encounter (Signed)
Per Dr Hilarie Fredrickson, patient should have egd/colon on 02/16/15 @ 3:00 pm. Patient's colonoscopy 02/18/15 has been cancelled and rescheduled for 02/16/15. I have spoken to patient to advise of new time and date of upcoming endoscopy and colonoscopy. She verbalizes understanding.

## 2015-02-01 NOTE — Progress Notes (Signed)
Pre-visit discussion using our clinic review tool. No additional management support is needed unless otherwise documented below in the visit note.  

## 2015-02-01 NOTE — Assessment & Plan Note (Signed)
Starting amlodipine today

## 2015-02-01 NOTE — Progress Notes (Signed)
Subjective:  Patient ID: Sonya Davis, female    DOB: May 13, 1945  Age: 69 y.o. MRN: [id number]  CC: The primary encounter diagnosis was Cough. Diagnoses of Gastroesophageal reflux disease with esophagitis, Rhinorrhea, Generalized anxiety disorder, and Essential hypertension were also pertinent to this visit.  HPI Sonya Davis presents for follow up on multiple issues   persistent cough despite using omeprazole twice daily,  Tessalon , and vicodin for cough bc tussionex was too $$$$,   Cough is at times productive of thick white sputum.   Has had pre op last Friday for colonoscopy.  Needs to have EGD to  Colonoscopy is scheduled for dec 9th  7:30 am ,  History of hypoglycemia in 1994 so she is the first one scheduled  Dr Hilarie Fredrickson.    Positive cologuard  Last colonoscopy 2004 no polyps  Worsening anxiety .[name] Kitchen  Not sleeping well,  difficulty concentrating.  Willing to try SSRI      Outpatient Prescriptions Prior to Visit  Medication Sig Dispense Refill  . benzonatate (TESSALON) 200 MG capsule Take 1 capsule (200 mg total) by mouth 3 (three) times daily as needed. 60 capsule 3  . calcium carbonate (OS-CAL) 600 MG TABS Take 600 mg by mouth 2 (two) times daily with a meal.      . cholecalciferol (VITAMIN D) 1000 UNITS tablet Take 1,000 Units by mouth daily.      Sonya Davis Calcium (STOOL SOFTENER PO) Take by mouth.      . furosemide (LASIX) 20 MG tablet Take 1 tablet (20 mg total) by mouth daily as needed. 30 tablet 3  . amphetamine-dextroamphetamine (ADDERALL) 20 MG tablet Take 1 tablet (20 mg total) by mouth 2 (two) times daily. 60 tablet 0  . HYDROcodone-acetaminophen (NORCO/VICODIN) 5-325 MG tablet Take 1 tablet by mouth every 6 (six) hours as needed. Maximum dose twice daily 60 tablet 0  . omeprazole (PRILOSEC) 20 MG capsule Take 1 capsule (20 mg total) by mouth daily. (Patient taking differently: Take 20 mg by mouth 2 (two) times daily before a meal. ) 30 capsule 3  . Na Sulfate-K  Sulfate-Mg Sulf (SUPREP BOWEL PREP) SOLN Take 1 kit by mouth once. Name brand only, suprep as directed, no substitutions (Patient not taking: Reported on 02/01/2015) 354 mL 0  . fexofenadine (ALLEGRA) 180 MG tablet Take 1 tablet (180 mg total) by mouth daily. (Patient not taking: Reported on 02/01/2015) 30 tablet 11  . ipratropium (ATROVENT) 0.06 % nasal spray PLACE 2 SPRAYS INTO BOTH NOSTRILS 4 (FOUR) TIMES DAILY. (Patient not taking: Reported on 01/28/2015) 15 mL 11   No facility-administered medications prior to visit.    Review of Systems;  Patient denies headache, fevers, malaise, unintentional weight loss, skin rash, eye pain, sinus congestion and sinus pain, sore throat, dysphagia,  hemoptysis , cough, dyspnea, wheezing, chest pain, palpitations, orthopnea, edema, abdominal pain, nausea, melena, diarrhea, constipation, flank pain, dysuria, hematuria, urinary  Frequency, nocturia, numbness, tingling, seizures,  Focal weakness, Loss of consciousness,  Tremor, insomnia, depression, anxiety, and suicidal ideation.      Objective:  BP 160/98 mmHg  Pulse 80  Temp(Src) 98.6 F (37 C) (Oral)  Resp 12  Ht _0  (1.6 m)  Wt 120 lb 8 oz (54.658 kg)  BMI 21.35 kg/m2  SpO2 95%  BP Readings from Last 3 Encounters:  02/01/15 160/98  12/24/14 140/86  09/17/14 132/84    Wt Readings from Last 3 Encounters:  02/01/15 120 lb 8 oz (54.658 kg)  01/28/15  121 lb (54.885 kg)  12/24/14 117 lb 8 oz (53.298 kg)    General appearance: alert, cooperative and appears stated age Ears: normal TM's and external ear canals both ears Throat: lips, mucosa, and tongue normal; teeth and gums normal Neck: no adenopathy, no carotid bruit, supple, symmetrical, trachea midline and thyroid not enlarged, symmetric, no tenderness/mass/nodules Back: symmetric, no curvature. ROM normal. No CVA tenderness. Lungs: clear to auscultation bilaterally Heart: regular rate and rhythm, S1, S2 normal, no murmur, click, rub  or gallop Abdomen: soft, non-tender; bowel sounds normal; no masses,  no organomegaly Pulses: 2+ and symmetric Skin: Skin color, texture, turgor normal. No rashes or lesions Lymph nodes: Cervical, supraclavicular, and axillary nodes normal.  No results found for: HGBA1C  Lab Results  Component Value Date   CREATININE 0.92 09/17/2014   CREATININE 1.0 08/07/2013   CREATININE 1.0 09/03/2012    Lab Results  Component Value Date   WBC 6.4 09/17/2014   HGB 13.9 09/17/2014   HCT 41.8 09/17/2014   PLT 265.0 09/17/2014   GLUCOSE 85 09/17/2014   CHOL 238* 09/17/2014   TRIG 157.0* 09/17/2014   HDL 69.40 09/17/2014   LDLDIRECT 141.2 01/10/2012   LDLCALC 137* 09/17/2014   ALT 16 09/17/2014   AST 24 09/17/2014   NA 138 09/17/2014   K 3.8 09/17/2014   CL 99 09/17/2014   CREATININE 0.92 09/17/2014   BUN 15 09/17/2014   CO2 32 09/17/2014   TSH 0.58 09/17/2014   MICROALBUR 0.3 09/03/2012    Dg Bone Density  01/06/2015  EXAM: DUAL X-RAY ABSORPTIOMETRY (DXA) FOR BONE MINERAL DENSITY IMPRESSION: Dear Dr. Deborra Davis, Your patient Sonya Davis completed a BMD test on 01/06/2015 using the [employer] (analysis version: 14.10) manufactured by [employer]. The following summarizes the results of our evaluation. PATIENT BIOGRAPHICAL: Name: Sonya Davis Patient ID: [id number] Birth Date: Aug 29, 1945 Height: 63.0 in. Gender: Female Exam Date: 01/06/2015 Weight: 117.5 lbs. Indications: Caucasian, Family Hx of Osteoporosis, Hysterectomy, Postmenopausal Fractures: Treatments: CALCIUM VIT D, Multi-Vitamin with calcium ASSESSMENT: The BMD measured at AP Spine L1-L3 is 0.848 g/cm2 with a T-score of -2.7. This patient is considered osteoporotic according to [facility]) criteria. L-4 was excluded due to degenerative changes. Site Region Measured Measured WHO Young Adult BMD Date       Age      Classification T-score AP Spine L1-L3 1/06/2015 69.4 Osteoporosis -2.7 0.848 g/cm2  DualFemur Neck Right 01/06/2015 69.4 Osteoporosis -2.6 0.683 g/cm2 World Health Organization [facility]) criteria for post-menopausal, Caucasian Women: Normal:       T-score at or above -1 SD Osteopenia:   T-score between -1 and -2.5 SD Osteoporosis: T-score at or below -2.5 SD RECOMMENDATIONS: [facility] recommends that FDA-approved medical therapies be considered in postmenopausal women and men age 50 or older with a: 1. Hip or vertebral (clinical or morphometric) fracture. 2. T-score of < -2.5 at the spine or hip. 3. Ten-year fracture probability by FRAX of 3% or greater for hip fracture or 20% or greater for major osteoporotic fracture. All treatment decisions require clinical judgment and consideration of individual patient factors, including patient preferences, co-morbidities, previous drug use, risk factors not captured in the FRAX model (e.g. falls, vitamin D deficiency, increased bone turnover, interval significant decline in bone density) and possible under - or over-estimation of fracture risk by FRAX. All patients should ensure an adequate intake of dietary calcium (1200 mg/d) and vitamin D (800 IU daily) unless contraindicated. FOLLOW-UP: People  with diagnosed cases of osteoporosis or at high risk for fracture should have regular bone mineral density tests. For patients eligible for Medicare, routine testing is allowed once every 2 years. The testing frequency can be increased to one year for patients who have rapidly progressing disease, those who are receiving or discontinuing medical therapy to restore bone mass, or have additional risk factors. I have reviewed this report, and agree with the above findings. [facility] Electronically Signed   By: Lajean Manes M.D.   On: 01/06/2015 14:29    Assessment & Plan:   Problem List Items Addressed This Visit    Rhinorrhea    No response to Atrovent trial  x 6 weeks,  Nor to antihistamine and steroid nasal spray.       Cough - Primary    Nocturnal,  No response to treatment for GERD with omeprazole bid and cough suppression .  Chest x ray ordered.   Will see if her upcoming colonoscopy by Dr Hilarie Fredrickson.can be expanded to include an EGD to rule out eosinophilic esophagitis..          Relevant Orders   DG Chest 2 View (Completed)   Anxiety disorder    Trial of lexapro 10 mg  ,  Prn alprazolam,  not more than once daily       Essential hypertension    Starting amlodipine today      Relevant Medications   amLODipine (NORVASC) 2.5 MG tablet    Other Visit Diagnoses    Gastroesophageal reflux disease with esophagitis        Relevant Medications    omeprazole (PRILOSEC) 20 MG capsule       I have discontinued Ms. Cesaro's ipratropium. I have also changed her omeprazole. Additionally, I am having her start on amLODipine, ALPRAZolam, and escitalopram. Lastly, I am having her maintain her calcium carbonate, cholecalciferol, Docusate Calcium (STOOL SOFTENER PO), furosemide, benzonatate, Na Sulfate-K Sulfate-Mg Sulf, cetirizine, fexofenadine, HYDROcodone-acetaminophen, and amphetamine-dextroamphetamine.  Meds ordered this encounter  Medications  . cetirizine (ZYRTEC) 10 MG tablet    Sig: Take 10 mg by mouth daily.  [name] Kitchen amLODipine (NORVASC) 2.5 MG tablet    Sig: Take 1 tablet (2.5 mg total) by mouth daily.    Dispense:  30 tablet    Refill:  1  . fexofenadine (ALLEGRA) 180 MG tablet    Sig: Take 1 tablet (180 mg total) by mouth daily.    Dispense:  90 tablet    Refill:  1  . omeprazole (PRILOSEC) 20 MG capsule    Sig: Take 1 capsule (20 mg total) by mouth 2 (two) times daily before a meal.    Dispense:  60 capsule    Refill:  3  . ALPRAZolam (XANAX) 0.5 MG tablet    Sig: Take 1 tablet (0.5 mg total) by mouth at bedtime as needed for anxiety.    Dispense:  30 tablet    Refill:  3  . HYDROcodone-acetaminophen (NORCO/VICODIN) 5-325 MG tablet    Sig: Take 1 tablet by mouth every 6 (six) hours as needed.  Maximum dose twice daily    Dispense:  60 tablet    Refill:  0    May refill on or after Feb 01 2015  . amphetamine-dextroamphetamine (ADDERALL) 20 MG tablet    Sig: Take 1 tablet (20 mg total) by mouth 2 (two) times daily.    Dispense:  60 tablet    Refill:  0    May refill on or after  Feb 01 2015  . escitalopram (LEXAPRO) 10 MG tablet    Sig: Take 1 tablet (10 mg total) by mouth daily.    Dispense:  30 tablet    Refill:  1    Medications Discontinued During This Encounter  Medication Reason  . ipratropium (ATROVENT) 0.06 % nasal spray   . fexofenadine (ALLEGRA) 180 MG tablet Reorder  . omeprazole (PRILOSEC) 20 MG capsule Reorder  . HYDROcodone-acetaminophen (NORCO/VICODIN) 5-325 MG tablet Reorder  . amphetamine-dextroamphetamine (ADDERALL) 20 MG tablet Reorder    Follow-up: No Follow-up on file.   Crecencio Mc, MD

## 2015-02-01 NOTE — Assessment & Plan Note (Signed)
Trial of lexapro 10 mg  ,  Prn alprazolam,  not more than once daily

## 2015-02-01 NOTE — Telephone Encounter (Signed)
-----   Message from Jerene Bears, MD sent at 02/01/2015  4:54 PM EST ----- Regarding: FW: Can an EGD be added to Dec 9 coonoscopy?   ----- Message -----    From: Crecencio Mc, MD    Sent: 02/01/2015   9:36 AM      To: Jerene Bears, MD Subject: Can an EGD be added to Dec 9 coonoscopy?       Can an EGD be done at the time of her colonoscopy? It is scheduled for 7:30 dec 9   She has been having a chronic cough,  Despite maximal suppressive therapy.  Would like to rule out EE.  Thanks  Helene Kelp

## 2015-02-01 NOTE — Assessment & Plan Note (Signed)
No response to Atrovent trial  x 6 weeks,  Nor to antihistamine and steroid nasal spray.

## 2015-02-01 NOTE — Assessment & Plan Note (Signed)
Nocturnal,  No response to treatment for GERD with omeprazole bid and cough suppression .  Chest x ray ordered.   Will see if her upcoming colonoscopy by Dr Hilarie Fredrickson.can be expanded to include an EGD to rule out eosinophilic esophagitis.[name] Kitchen

## 2015-02-04 ENCOUNTER — Other Ambulatory Visit: Payer: Self-pay | Admitting: Internal Medicine

## 2015-02-04 ENCOUNTER — Encounter: Payer: Self-pay | Admitting: Internal Medicine

## 2015-02-06 ENCOUNTER — Telehealth: Payer: Self-pay | Admitting: Internal Medicine

## 2015-02-06 DIAGNOSIS — R059 Cough, unspecified: Secondary | ICD-10-CM

## 2015-02-06 DIAGNOSIS — R05 Cough: Secondary | ICD-10-CM

## 2015-02-06 DIAGNOSIS — J431 Panlobular emphysema: Secondary | ICD-10-CM

## 2015-02-06 NOTE — Telephone Encounter (Signed)
See unrouted telephone note.  thank you

## 2015-02-06 NOTE — Telephone Encounter (Signed)
She has not read her mychart message aout her chest x ray and meds ordered. Can you read it to her today?

## 2015-02-07 MED ORDER — FLUTICASONE-SALMETEROL 250-50 MCG/DOSE IN AEPB: 1.0000 | INHALATION_SPRAY | Freq: Two times a day (BID) | RESPIRATORY_TRACT | Status: DC

## 2015-02-07 MED ORDER — ALBUTEROL SULFATE HFA 108 (90 BASE) MCG/ACT IN AERS: 2.0000 | INHALATION_SPRAY | Freq: Four times a day (QID) | RESPIRATORY_TRACT | Status: DC | PRN

## 2015-02-07 NOTE — Telephone Encounter (Signed)
Left message for patient to return call to office. 

## 2015-02-07 NOTE — Telephone Encounter (Signed)
Notified patient of medication and patient stated she will have the pharmacist instruct or schedule nurse visit? I have ordered  the 2 inhalers please advise if these are correct ones. Confirmed verbally By MD

## 2015-02-11 ENCOUNTER — Encounter: Payer: Self-pay | Admitting: Internal Medicine

## 2015-02-16 ENCOUNTER — Encounter: Payer: Self-pay | Admitting: Internal Medicine

## 2015-02-16 ENCOUNTER — Ambulatory Visit (AMBULATORY_SURGERY_CENTER): Payer: Medicare Other | Admitting: Internal Medicine

## 2015-02-16 VITALS — BP 152/65 | HR 71 | Temp 97.7°F | Resp 20 | Ht 63.0 in | Wt 121.0 lb

## 2015-02-16 DIAGNOSIS — R195 Other fecal abnormalities: Secondary | ICD-10-CM

## 2015-02-16 DIAGNOSIS — D123 Benign neoplasm of transverse colon: Secondary | ICD-10-CM

## 2015-02-16 DIAGNOSIS — D125 Benign neoplasm of sigmoid colon: Secondary | ICD-10-CM | POA: Diagnosis not present

## 2015-02-16 DIAGNOSIS — R053 Chronic cough: Secondary | ICD-10-CM

## 2015-02-16 DIAGNOSIS — M069 Rheumatoid arthritis, unspecified: Secondary | ICD-10-CM | POA: Diagnosis not present

## 2015-02-16 DIAGNOSIS — F900 Attention-deficit hyperactivity disorder, predominantly inattentive type: Secondary | ICD-10-CM | POA: Diagnosis not present

## 2015-02-16 DIAGNOSIS — R05 Cough: Secondary | ICD-10-CM | POA: Diagnosis not present

## 2015-02-16 DIAGNOSIS — K621 Rectal polyp: Secondary | ICD-10-CM | POA: Diagnosis not present

## 2015-02-16 DIAGNOSIS — K209 Esophagitis, unspecified without bleeding: Secondary | ICD-10-CM

## 2015-02-16 DIAGNOSIS — Z1211 Encounter for screening for malignant neoplasm of colon: Secondary | ICD-10-CM

## 2015-02-16 DIAGNOSIS — D128 Benign neoplasm of rectum: Secondary | ICD-10-CM

## 2015-02-16 DIAGNOSIS — I10 Essential (primary) hypertension: Secondary | ICD-10-CM | POA: Diagnosis not present

## 2015-02-16 DIAGNOSIS — K219 Gastro-esophageal reflux disease without esophagitis: Secondary | ICD-10-CM | POA: Diagnosis not present

## 2015-02-16 MED ORDER — PANTOPRAZOLE SODIUM 40 MG PO TBEC: 40.0000 mg | DELAYED_RELEASE_TABLET | Freq: Every day | ORAL | Status: DC

## 2015-02-16 MED ORDER — SODIUM CHLORIDE 0.9 % IV SOLN: 500.0000 mL | INTRAVENOUS | Status: DC

## 2015-02-16 NOTE — Progress Notes (Signed)
Report to PACU, RN, vss, BBS= Clear.  

## 2015-02-16 NOTE — Op Note (Addendum)
[place]  [address]. [place], [address]   COLONOSCOPY PROCEDURE REPORT  PATIENT: Sonya Davis, Sonya Davis  MR#: [id number] BIRTHDATE: 1946/01/17 , 69  yrs. old GENDER: female ENDOSCOPIST: Jerene Bears, MD REFERRED [id number] Derrel Nip, M.D. PROCEDURE DATE:  02/16/2015 PROCEDURE:   Colonoscopy, diagnostic and Colonoscopy with snare polypectomy First Screening Colonoscopy - Avg.  risk and is 50 yrs.  old or older - No.  Prior Negative Screening - Now for repeat screening. 10 or more years since last screening  History of Adenoma - Now for follow-up colonoscopy & has been > or = to 3 yrs.  N/A  Polyps removed today? Yes ASA CLASS:   Class II INDICATIONS:positive Cologuard, last colonoscopy approximately 2004 reportedly normal. MEDICATIONS: Monitored anesthesia care, Propofol 400 mg IV, and this was the total dose used for all procedures at this session  DESCRIPTION OF PROCEDURE:   After the risks benefits and alternatives of the procedure were thoroughly explained, informed consent was obtained.  The digital rectal exam revealed no rectal mass.   The LB [id number]  endoscope was introduced through the anus and advanced to the terminal ileum which was intubated for a short distance. No adverse events experienced.   The quality of the prep was good.  (Suprep was used)  The instrument was then slowly withdrawn as the colon was fully examined. Estimated blood loss is zero unless otherwise noted in this procedure report.  COLON FINDINGS: Two sessile polyps measuring 5 mm in size were found in the transverse colon and sigmoid colon.  Polypectomies were performed with a cold snare.  The resection was complete, the polyp tissue was completely retrieved and sent to histology.   Two sessile polyps ranging between 3-53mm in size were found in the rectum.  Polypectomies were performed with a cold snare.  The resection was complete, the polyp tissue was completely retrieved and  sent to histology.   The colon was redundant.  Retroflexed views revealed no abnormalities. The time to cecum = 5.0 Withdrawal time = 11.4   The scope was withdrawn and the procedure completed. COMPLICATIONS: There were no immediate complications.  ENDOSCOPIC IMPRESSION: 1.   Two sessile polyps were found in the transverse colon and sigmoid colon; polypectomies were performed with a cold snare 2.   Two sessile polyps ranging between 3-66mm in size were found in the rectum; polypectomies were performed with a cold snare 3.   The colon was redundant  RECOMMENDATIONS: 1.  Await pathology results 2.  Timing of repeat colonoscopy will be determined by pathology findings. 3.  You will receive a letter within 1-2 weeks with the results of your biopsy as well as final recommendations.  Please call my office if you have not received a letter after 3 weeks.  eSigned:  Jerene Bears, MD 02/16/2015 3:52 PM cc: the patient, Dr. Derrel Nip

## 2015-02-16 NOTE — Progress Notes (Signed)
Called to room to assist during endoscopic procedure.  Patient ID and intended procedure confirmed with present staff. Received instructions for my participation in the procedure from the performing physician.  

## 2015-02-16 NOTE — Op Note (Signed)
[place]  [address]. [place], [address]   ENDOSCOPY PROCEDURE REPORT  PATIENT: Sonya Davis, Sonya Davis  MR#: [id number] BIRTHDATE: 1945/12/26 , 69  yrs. old GENDER: female ENDOSCOPIST: Jerene Bears, MD REFERRED BY:  Deborra Medina, M.D. PROCEDURE DATE:  02/16/2015 PROCEDURE:  EGD, diagnostic and EGD w/ biopsy ASA CLASS:     Class II INDICATIONS:  chronic cough, ? of reflux. MEDICATIONS: Monitored anesthesia care and Propofol 200 mg IV TOPICAL ANESTHETIC: none  DESCRIPTION OF PROCEDURE: After the risks benefits and alternatives of the procedure were thoroughly explained, informed consent was obtained.  The LB [id number] endoscope was introduced through the mouth and advanced to the second portion of the duodenum , Without limitations.  The instrument was slowly withdrawn as the mucosa was fully examined.   ESOPHAGUS: Esophagitis, characterized by linear erosion and superficial ulceration, was found in the mid esophagus and distal esophagus.  Multiple biopsies were performed using cold forceps. Sample obtained for evaluation of eosinophilic esophagitis.  STOMACH: A small hiatal hernia was noted.   The mucosa of the stomach appeared normal.  DUODENUM: The duodenal mucosa showed no abnormalities in the bulb and 2nd part of the duodenum. Retroflexed views revealed a hiatal hernia.     The scope was then withdrawn from the patient and the procedure completed.  COMPLICATIONS: There were no immediate complications.  ENDOSCOPIC IMPRESSION: 1.   Esophagitis in the mid esophagus and distal esophagus; multiple biopsies were performed 2.   Small hiatal hernia 3.   The mucosa of the stomach appeared normal 4.   The duodenal mucosa showed no abnormalities in the bulb and 2nd part of the duodenum  RECOMMENDATIONS: 1.  Await biopsy results 2.  Continue PPI 3.  Proceed with a Colonoscopy.  eSigned:  Jerene Bears, MD 02/16/2015 3:49 PM   CC: the patient, Dr.  Derrel Nip

## 2015-02-16 NOTE — Patient Instructions (Addendum)

## 2015-02-17 ENCOUNTER — Telehealth: Payer: Self-pay | Admitting: *Deleted

## 2015-02-17 NOTE — Telephone Encounter (Signed)
  Follow up Call-  Call back number 02/16/2015  Post procedure Call Back phone  # [phone]  Permission to leave phone message Yes     No answer at # given.  Left message on voicemail.

## 2015-02-18 ENCOUNTER — Encounter: Payer: Medicare Other | Admitting: Internal Medicine

## 2015-02-21 ENCOUNTER — Encounter: Payer: Self-pay | Admitting: Internal Medicine

## 2015-02-21 ENCOUNTER — Ambulatory Visit (INDEPENDENT_AMBULATORY_CARE_PROVIDER_SITE_OTHER): Payer: Medicare Other | Admitting: Pulmonary Disease

## 2015-02-21 ENCOUNTER — Encounter: Payer: Self-pay | Admitting: Pulmonary Disease

## 2015-02-21 VITALS — BP 130/72 | HR 107 | Ht 63.5 in | Wt 120.8 lb

## 2015-02-21 DIAGNOSIS — Z87891 Personal history of nicotine dependence: Secondary | ICD-10-CM | POA: Diagnosis not present

## 2015-02-21 DIAGNOSIS — J440 Chronic obstructive pulmonary disease with acute lower respiratory infection: Secondary | ICD-10-CM | POA: Diagnosis not present

## 2015-02-21 DIAGNOSIS — J438 Other emphysema: Secondary | ICD-10-CM

## 2015-02-21 DIAGNOSIS — R05 Cough: Secondary | ICD-10-CM

## 2015-02-21 DIAGNOSIS — R059 Cough, unspecified: Secondary | ICD-10-CM

## 2015-02-21 MED ORDER — DOXYCYCLINE HYCLATE 100 MG PO TABS: 100.0000 mg | ORAL_TABLET | Freq: Two times a day (BID) | ORAL | Status: DC

## 2015-02-21 MED ORDER — PREDNISONE 20 MG PO TABS: 20.0000 mg | ORAL_TABLET | Freq: Every day | ORAL | Status: AC

## 2015-02-21 NOTE — Patient Instructions (Signed)
Doxycycline 100 mg twice a day for 7 days Prednisone 20 mg - 2 pils daily X 5 days Take Advair inhaler as prescribed by Dr Derrel Nip Take Protonix as prescribed by Dr Hilarie Fredrickson Cont Allegra/Zyrtec as you are doing now Follow up in 4 weeks. We will obtain full lung function tests prior to that office visit

## 2015-02-21 NOTE — Progress Notes (Signed)
PULMONARY CONSULT NOTE  Requesting MD/Service: Derrel Nip, MD/Internal medicine Date of initial consultation: 02/21/15 Reason for consultation: COPD cough  PT PROFILE: 69 yo former smoker (quit 2002) referred by Dr Derrel Nip 02/21/15 for evaluation of subacute cough  HPI:  New pt evaluation referred for evaluation of intermittent cough X 1 year which has been progressively worse in past 4-6 months. She reports severe nocturnal cough frequently awakening her. She describes her cough as "hacking". It is occasionally productive of white mucus. She denies fever, dyspnea, hemoptysis, CP, LE edema. She has a history of GERD that has been treated with PPI. Has seen gastroenterology recently and PPI was changed to pantoprazole from omeprazole. An EGD revealed esophagitis and biopsies were c/w reflux esophagitis. For the cough, she has been prescribed an Advair inhaler which she has not filled or started yet. She does take antihistamines for chronic rhinitis. She smoked relatively lightly until age 87. She has no significant occupational exposures.     Past Medical History  Diagnosis Date  . Gastritis   . 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 Rheumatoid arthritis(714.0) 2000    per patient by labs  . Ovarian cyst     right, endometriosis  . ADD (attention deficit disorder)   . Fracture of bone of left shoulder 1981  . Fracture closed, pubis ([place]) 1981  . Fracture of maxilla ([place]) 1981  . History of ovarian cyst   . Endometriosis of ovary     prior GYN [name]  . History of pericarditis 1988    occurred after flu shot, with pneumonia  . GERD (gastroesophageal reflux disease)   . Allergy     seasonal  . Cataract     surgery to remove them  . Osteoporosis   . Pericarditis     does not receive flu vaccine becuase of this  Prior history of pleurisy and PNA 1988  Past Surgical History  Procedure Laterality Date  . Anterior  cruciate ligament repair  age 78    left  . Carpal tunnel release      bilateral surgeries  . Arthroscopic repair acl  1999    left knee  . Abdominal hysterectomy      secondary to endometriosis  . Eye surgery  2005    left cataract   . Cataract extraction  Mar 2013    right eye, [place]  . Septoplasty  Dec 2006    [name],   . Cystectomy  1963  . Appendectomy      at age 63  . Septoplasty  2006    MEDICATIONS: I have reviewed all medications and confirmed regimen as documented  Social History   Social History  . Marital Status: Single    Spouse Name: N/A  . Number of Children: N/A  . Years of Education: N/A   Occupational History  . Not on file.   Social History Main Topics  . Smoking status: Former Smoker    Types: Cigarettes    Quit date: 02/11/2001  . Smokeless tobacco: Never Used  . Alcohol Use: 3.0 oz/week    5 Standard drinks or equivalent per week  . Drug Use: No  . Sexual Activity: Not on file   Other Topics Concern  . Not on file   Social History Narrative    Family History  Problem Relation Age of Onset  . Cancer Mother     lung  .  Heart disease Father   . Crohn's disease Sister   . Breast cancer Cousin   . Colon cancer Neg Hx     ROS: No fever, myalgias, unexplained weight loss or weight gain No new focal weakness or sensory deficits No otalgia, hearing loss, visual changes, mouth and throat problems No neck pain or adenopathy No abdominal pain, N/V/D, diarrhea, change in bowel pattern No dysuria, change in urinary pattern No LE edema or calf tenderness   Filed Vitals:   02/21/15 1030  BP: 130/72  Pulse: 107  Height: 5' 3.5" (1.613 m)  Weight: 120 lb 12.8 oz (54.795 kg)  SpO2: 95%     EXAM:  Gen: WDWN, No overt respiratory distress, coughing occasionally during encounter HEENT: NCAT, TMs and canals normal, sclera white, nares and nasal mucosa normal, oropharynx normal Neck: Supple without LAN, thyromegaly,  JVD Lungs: breath sounds full, percussion note normal, unusual inspiratory crunch isolated to small area in L anterior chest Cardiovascular: Reg rhythm, rate normal, no murmurs noted Abdomen: Soft, nontender, normal BS Ext: without clubbing, cyanosis, edema Neuro: CNs grossly intact, motor and sensory intact, DTRs symmetric Skin: Limited exam, no lesions noted  DATA:   BMP Latest Ref Rng 09/17/2014 08/07/2013 09/03/2012  Glucose 70 - 99 mg/dL 85 92 99  BUN 6 - 23 mg/dL 15 14 25(H)  Creatinine 0.40 - 1.20 mg/dL 0.92 1.0 1.0  Sodium 135 - 145 mEq/L 138 140 136  Potassium 3.5 - 5.1 mEq/L 3.8 3.5 4.2  Chloride 96 - 112 mEq/L 99 104 98  CO2 19 - 32 mEq/L 32 27 25  Calcium 8.4 - 10.5 mg/dL 9.5 9.5 10.4    CBC Latest Ref Rng 09/17/2014 01/10/2012  WBC 4.0 - 10.5 K/uL 6.4 6.3  Hemoglobin 12.0 - 15.0 g/dL 13.9 13.8  Hematocrit 36.0 - 46.0 % 41.8 42.4  Platelets 150.0 - 400.0 K/uL 265.0 266.0    CXR:  02/01/15: hyperinflated, no acute disease  IMPRESSION:     ICD-9-CM ICD-10-CM   1. Cough 786.2 R05   2. Other emphysema ([place]) 492.8 J43.8   3. Former smoker V15.82 Z87.891   4. Bronchitis, chronic obstructive w acute bronchitis (HCC) 491.22 J44.0   1) former smoker 2) CXR c/w emphysema (hyperinflation) 3) Chronic cough - likely chronic bronchitis. Might be contributing factors of GERD and rhinosinusitis 4) recent worsening of symptoms - will treat as acute exacerbation of bronchitis   PLAN:   Doxycycline 100 mg twice a day for 7 days Prednisone 20 mg - 2 pils daily X 5 days Take Advair inhaler as prescribed by Dr Derrel Nip Take Protonix as prescribed by Dr Hilarie Fredrickson Cont Allegra/Zyrtec as you are doing now Follow up in 4 weeks. We will obtain full lung function tests prior to that office visit   Wilhelmina Mcardle, MD [facility] Pulmonary, Critical Care Medicine

## 2015-02-22 ENCOUNTER — Encounter: Payer: Self-pay | Admitting: Internal Medicine

## 2015-02-25 ENCOUNTER — Institutional Professional Consult (permissible substitution): Payer: Medicare Other | Admitting: Pulmonary Disease

## 2015-03-01 DIAGNOSIS — J301 Allergic rhinitis due to pollen: Secondary | ICD-10-CM | POA: Diagnosis not present

## 2015-03-03 ENCOUNTER — Other Ambulatory Visit: Payer: Self-pay | Admitting: Internal Medicine

## 2015-03-09 ENCOUNTER — Encounter: Payer: Self-pay | Admitting: *Deleted

## 2015-03-28 ENCOUNTER — Ambulatory Visit (INDEPENDENT_AMBULATORY_CARE_PROVIDER_SITE_OTHER): Payer: Medicare Other | Admitting: Internal Medicine

## 2015-03-28 ENCOUNTER — Encounter: Payer: Self-pay | Admitting: Internal Medicine

## 2015-03-28 VITALS — BP 122/96 | HR 97 | Temp 98.4°F | Resp 12 | Ht 64.0 in | Wt 120.0 lb

## 2015-03-28 DIAGNOSIS — R05 Cough: Secondary | ICD-10-CM | POA: Diagnosis not present

## 2015-03-28 DIAGNOSIS — R030 Elevated blood-pressure reading, without diagnosis of hypertension: Secondary | ICD-10-CM | POA: Diagnosis not present

## 2015-03-28 DIAGNOSIS — R059 Cough, unspecified: Secondary | ICD-10-CM

## 2015-03-28 DIAGNOSIS — E559 Vitamin D deficiency, unspecified: Secondary | ICD-10-CM

## 2015-03-28 DIAGNOSIS — F900 Attention-deficit hyperactivity disorder, predominantly inattentive type: Secondary | ICD-10-CM

## 2015-03-28 DIAGNOSIS — IMO0001 Reserved for inherently not codable concepts without codable children: Secondary | ICD-10-CM

## 2015-03-28 DIAGNOSIS — F909 Attention-deficit hyperactivity disorder, unspecified type: Secondary | ICD-10-CM

## 2015-03-28 DIAGNOSIS — R5383 Other fatigue: Secondary | ICD-10-CM

## 2015-03-28 DIAGNOSIS — F411 Generalized anxiety disorder: Secondary | ICD-10-CM

## 2015-03-28 LAB — CBC WITH DIFFERENTIAL/PLATELET
Basophils Absolute: 0 10*3/uL (ref 0.0–0.1)
Basophils Relative: 0.7 % (ref 0.0–3.0)
EOS PCT: 3.3 % (ref 0.0–5.0)
Eosinophils Absolute: 0.2 10*3/uL (ref 0.0–0.7)
HEMATOCRIT: 41.8 % (ref 36.0–46.0)
HEMOGLOBIN: 13.8 g/dL (ref 12.0–15.0)
LYMPHS ABS: 1.7 10*3/uL (ref 0.7–4.0)
LYMPHS PCT: 23.6 % (ref 12.0–46.0)
MCHC: 33 g/dL (ref 30.0–36.0)
MCV: 90.6 fl (ref 78.0–100.0)
MONOS PCT: 7.1 % (ref 3.0–12.0)
Monocytes Absolute: 0.5 10*3/uL (ref 0.1–1.0)
NEUTROS PCT: 65.3 % (ref 43.0–77.0)
Neutro Abs: 4.7 10*3/uL (ref 1.4–7.7)
Platelets: 339 10*3/uL (ref 150.0–400.0)
RBC: 4.61 Mil/uL (ref 3.87–5.11)
RDW: 13.4 % (ref 11.5–15.5)
WBC: 7.3 10*3/uL (ref 4.0–10.5)

## 2015-03-28 LAB — COMPREHENSIVE METABOLIC PANEL
ALBUMIN: 4.4 g/dL (ref 3.5–5.2)
ALT: 16 U/L (ref 0–35)
AST: 24 U/L (ref 0–37)
Alkaline Phosphatase: 86 U/L (ref 39–117)
BILIRUBIN TOTAL: 0.3 mg/dL (ref 0.2–1.2)
BUN: 17 mg/dL (ref 6–23)
CALCIUM: 10.2 mg/dL (ref 8.4–10.5)
CO2: 25 mEq/L (ref 19–32)
CREATININE: 0.78 mg/dL (ref 0.40–1.20)
Chloride: 101 mEq/L (ref 96–112)
GFR: 77.68 mL/min (ref >60.00)
Glucose, Bld: 82 mg/dL (ref 70–99)
Potassium: 3.7 mEq/L (ref 3.5–5.1)
Sodium: 140 mEq/L (ref 135–145)
TOTAL PROTEIN: 7.5 g/dL (ref 6.0–8.3)

## 2015-03-28 LAB — VITAMIN D 25 HYDROXY (VIT D DEFICIENCY, FRACTURES): VITD: 23.35 ng/mL — ABNORMAL LOW (ref 30.00–100.00)

## 2015-03-28 MED ORDER — AMPHETAMINE-DEXTROAMPHETAMINE 20 MG PO TABS: 20.0000 mg | ORAL_TABLET | Freq: Two times a day (BID) | ORAL | Status: DC

## 2015-03-28 MED ORDER — HYDROCODONE-ACETAMINOPHEN 5-325 MG PO TABS: 1.0000 | ORAL_TABLET | Freq: Four times a day (QID) | ORAL | Status: DC | PRN

## 2015-03-28 MED ORDER — MOMETASONE FUROATE 0.1 % EX CREA: 1.0000 "application " | TOPICAL_CREAM | Freq: Every day | CUTANEOUS | Status: DC

## 2015-03-28 MED ORDER — ALPRAZOLAM 0.5 MG PO TABS: 0.5000 mg | ORAL_TABLET | Freq: Every evening | ORAL | Status: DC | PRN

## 2015-03-28 NOTE — Progress Notes (Signed)
Subjective:  Patient ID: Sonya Davis, female    DOB: 09-Nov-1945  Age: 70 y.o. MRN: [id number]  CC: The primary encounter diagnosis was Other fatigue. Diagnoses of Vitamin D deficiency, Cough, Elevated BP, Generalized anxiety disorder, and Attention deficit disorder of adult with hyperactivity were also pertinent to this visit.  HPI Sonya Davis presents for follow up on persistent cough,  Has undergone EGD  No eosinophilic  Esophagitis .  Has had a   Pulmonology eval,  Was treated with prednisone 80 mg daily for 4 days and doxy x 7 days  .  Did not feel better until all meds were finished .   PFTS set up for next week .  Lungs were markedly  hyperinflated by chest States that the cough has improved dramatically since applying oil of oregano to feet daily for one month.  (essential oil).  Has not used the advair , only took two puffs. Has been using the tessalon sparingly less than daily   Had a viral  GI illness after sick contacts with 70 yr old in late Dec .  Vomited 3 times in 24 hours,  Watery .  No diarrhea   Outpatient Prescriptions Prior to Visit  Medication Sig Dispense Refill  . albuterol (PROVENTIL HFA;VENTOLIN HFA) 108 (90 BASE) MCG/ACT inhaler Inhale 2 puffs into the lungs every 6 (six) hours as needed for wheezing or shortness of breath. 1 Inhaler 2  . amLODipine (NORVASC) 2.5 MG tablet TAKE ONE TABLET EVERY DAY 30 tablet 0  . benzonatate (TESSALON) 200 MG capsule Take 1 capsule (200 mg total) by mouth 3 (three) times daily as needed. 60 capsule 3  . calcium carbonate (OS-CAL) 600 MG TABS Take 600 mg by mouth 2 (two) times daily with a meal.      . cetirizine (ZYRTEC) 10 MG tablet Take 10 mg by mouth daily.    . cholecalciferol (VITAMIN D) 1000 UNITS tablet Take 1,000 Units by mouth daily.      [name] Calcium (STOOL SOFTENER PO) Take by mouth.      . doxycycline (VIBRA-TABS) 100 MG tablet Take 1 tablet (100 mg total) by mouth 2 (two) times daily. 14 tablet 0  . escitalopram  (LEXAPRO) 10 MG tablet Take 1 tablet (10 mg total) by mouth daily. 30 tablet 1  . fexofenadine (ALLEGRA) 180 MG tablet Take 1 tablet (180 mg total) by mouth daily. 90 tablet 1  . Fluticasone-Salmeterol (ADVAIR DISKUS) 250-50 MCG/DOSE AEPB Inhale 1 puff into the lungs 2 (two) times daily. 1 each 3  . furosemide (LASIX) 20 MG tablet Take 1 tablet (20 mg total) by mouth daily as needed. 30 tablet 3  . pantoprazole (PROTONIX) 40 MG tablet Take 1 tablet (40 mg total) by mouth daily. 30 tablet 3  . ALPRAZolam (XANAX) 0.5 MG tablet Take 1 tablet (0.5 mg total) by mouth at bedtime as needed for anxiety. 30 tablet 3  . amphetamine-dextroamphetamine (ADDERALL) 20 MG tablet Take 1 tablet (20 mg total) by mouth 2 (two) times daily. 60 tablet 0  . HYDROcodone-acetaminophen (NORCO/VICODIN) 5-325 MG tablet Take 1 tablet by mouth every 6 (six) hours as needed. Maximum dose twice daily 60 tablet 0   No facility-administered medications prior to visit.    Review of Systems;  Patient denies headache, fevers, malaise, unintentional weight loss, skin rash, eye pain, sinus congestion and sinus pain, sore throat, dysphagia,  hemoptysis , cough, dyspnea, wheezing, chest pain, palpitations, orthopnea, edema, abdominal pain, nausea, melena, diarrhea, constipation,  flank pain, dysuria, hematuria, urinary  Frequency, nocturia, numbness, tingling, seizures,  Focal weakness, Loss of consciousness,  Tremor, insomnia, depression, anxiety, and suicidal ideation.      Objective:  BP 122/96 mmHg  Pulse 97  Temp(Src) 98.4 F (36.9 C) (Oral)  Resp 12  Ht 5\' 4"  (1.626 m)  Wt 120 lb (54.432 kg)  BMI 20.59 kg/m2  SpO2 98%  BP Readings from Last 3 Encounters:  03/28/15 122/96  02/21/15 130/72  02/16/15 152/65    Wt Readings from Last 3 Encounters:  03/28/15 120 lb (54.432 kg)  02/21/15 120 lb 12.8 oz (54.795 kg)  02/16/15 121 lb (54.885 kg)    General appearance: alert, cooperative and appears stated age Ears:  normal TM's and external ear canals both ears Throat: lips, mucosa, and tongue normal; teeth and gums normal Neck: no adenopathy, no carotid bruit, supple, symmetrical, trachea midline and thyroid not enlarged, symmetric, no tenderness/mass/nodules Back: symmetric, no curvature. ROM normal. No CVA tenderness. Lungs: clear to auscultation bilaterally Heart: regular rate and rhythm, S1, S2 normal, no murmur, click, rub or gallop Abdomen: soft, non-tender; bowel sounds normal; no masses,  no organomegaly Pulses: 2+ and symmetric Skin: Skin color, texture, turgor normal. No rashes or lesions Lymph nodes: Cervical, supraclavicular, and axillary nodes normal.  No results found for: HGBA1C  Lab Results  Component Value Date   CREATININE 0.78 03/28/2015   CREATININE 0.92 09/17/2014   CREATININE 1.0 08/07/2013    Lab Results  Component Value Date   WBC 7.3 03/28/2015   HGB 13.8 03/28/2015   HCT 41.8 03/28/2015   PLT 339.0 03/28/2015   GLUCOSE 82 03/28/2015   CHOL 238* 09/17/2014   TRIG 157.0* 09/17/2014   HDL 69.40 09/17/2014   LDLDIRECT 141.2 01/10/2012   LDLCALC 137* 09/17/2014   ALT 16 03/28/2015   AST 24 03/28/2015   NA 140 03/28/2015   K 3.7 03/28/2015   CL 101 03/28/2015   CREATININE 0.78 03/28/2015   BUN 17 03/28/2015   CO2 25 03/28/2015   TSH 0.58 09/17/2014   MICROALBUR 0.3 09/03/2012    Dg Chest 2 View  02/01/2015  CLINICAL DATA:  Productive cough for 3-4 months, worsening recently, former smoking history EXAM: CHEST  2 VIEW COMPARISON:  Chest x-ray of 05/11/2011 FINDINGS: The lungs are markedly hyperaerated with flattened hemidiaphragms and increased AP diameter consistent with emphysema. No infiltrate or effusion is seen. Mediastinal and hilar contours are unremarkable. The heart is within normal limits in size. No bony abnormality is seen. IMPRESSION: Emphysema.  No active lung disease. Electronically Signed   By: Ivar Drape M.D.   On: 02/01/2015 10:55     Assessment & Plan:   Problem List Items Addressed This Visit    Attention deficit disorder of adult with hyperactivity    Managed with adderall.  Dose has been stable. Refills given.               Elevated BP    Variable control.  Using amlodipine at 2.5 mg daily dose .  She has been asked to check her BP at home.       Cough    Workup continues for cause,  PFTS are scheduled .  She never started the Advair but reports that  cough has improved since taking oil of oregano and applying to her feet       Anxiety disorder    tolerating lexapro 10 mg, but still requiring use of alprazolam,  not more than  once daily           Other Visit Diagnoses    Other fatigue    -  Primary    Relevant Orders    Comprehensive metabolic panel (Completed)    CBC with Differential/Platelet (Completed)    Vitamin D deficiency        Relevant Orders    VITAMIN D 25 Hydroxy (Vit-D Deficiency, Fractures) (Completed)      A total of 25 minutes of face to face time was spent with patient more than half of which was spent in counselling about the above mentioned conditions  and coordination of care   I am having Ms. Roberge start on mometasone. I am also having her maintain her calcium carbonate, cholecalciferol, Docusate Calcium (STOOL SOFTENER PO), furosemide, benzonatate, cetirizine, fexofenadine, escitalopram, Fluticasone-Salmeterol, albuterol, pantoprazole, doxycycline, amLODipine, ALPRAZolam, amphetamine-dextroamphetamine, and HYDROcodone-acetaminophen.  Meds ordered this encounter  Medications  . ALPRAZolam (XANAX) 0.5 MG tablet    Sig: Take 1 tablet (0.5 mg total) by mouth at bedtime as needed for anxiety.    Dispense:  30 tablet    Refill:  5  . DISCONTD: amphetamine-dextroamphetamine (ADDERALL) 20 MG tablet    Sig: Take 1 tablet (20 mg total) by mouth 2 (two) times daily.    Dispense:  60 tablet    Refill:  0    May refill on or after Apr 03 2015  . DISCONTD:  HYDROcodone-acetaminophen (NORCO/VICODIN) 5-325 MG tablet    Sig: Take 1 tablet by mouth every 6 (six) hours as needed. Maximum dose twice daily    Dispense:  60 tablet    Refill:  0    May refill on or after Apr 03 2015  . DISCONTD: amphetamine-dextroamphetamine (ADDERALL) 20 MG tablet    Sig: Take 1 tablet (20 mg total) by mouth 2 (two) times daily.    Dispense:  60 tablet    Refill:  0    May refill on or after May 04 2015  . DISCONTD: HYDROcodone-acetaminophen (NORCO/VICODIN) 5-325 MG tablet    Sig: Take 1 tablet by mouth every 6 (six) hours as needed. Maximum dose twice daily    Dispense:  60 tablet    Refill:  0    May refill on or after May 04 2015  . DISCONTD: HYDROcodone-acetaminophen (NORCO/VICODIN) 5-325 MG tablet    Sig: Take 1 tablet by mouth every 6 (six) hours as needed. Maximum dose twice daily    Dispense:  60 tablet    Refill:  0    May refill on or after June 01 2015  . DISCONTD: amphetamine-dextroamphetamine (ADDERALL) 20 MG tablet    Sig: Take 1 tablet (20 mg total) by mouth 2 (two) times daily.    Dispense:  60 tablet    Refill:  0    May refill on or after June 01 2015  . mometasone (ELOCON) 0.1 % cream    Sig: Apply 1 application topically daily. To ear canal    Dispense:  45 g    Refill:  0  . amphetamine-dextroamphetamine (ADDERALL) 20 MG tablet    Sig: Take 1 tablet (20 mg total) by mouth 2 (two) times daily.    Dispense:  60 tablet    Refill:  0    May refill on or after June 01 2015  . HYDROcodone-acetaminophen (NORCO/VICODIN) 5-325 MG tablet    Sig: Take 1 tablet by mouth every 6 (six) hours as needed. Maximum dose twice daily  Dispense:  60 tablet    Refill:  0    May refill on or after June 01 2015    Medications Discontinued During This Encounter  Medication Reason  . ALPRAZolam (XANAX) 0.5 MG tablet Reorder  . amphetamine-dextroamphetamine (ADDERALL) 20 MG tablet Reorder  . HYDROcodone-acetaminophen (NORCO/VICODIN) 5-325 MG tablet  Reorder  . amphetamine-dextroamphetamine (ADDERALL) 20 MG tablet Reorder  . HYDROcodone-acetaminophen (NORCO/VICODIN) 5-325 MG tablet Reorder  . HYDROcodone-acetaminophen (NORCO/VICODIN) 5-325 MG tablet Reorder  . amphetamine-dextroamphetamine (ADDERALL) 20 MG tablet Reorder  . amphetamine-dextroamphetamine (ADDERALL) 20 MG tablet Reorder  . HYDROcodone-acetaminophen (NORCO/VICODIN) 5-325 MG tablet Reorder    Follow-up: No Follow-up on file.   Crecencio Mc, MD

## 2015-03-28 NOTE — Patient Instructions (Signed)
I am prescribing a steroid ointment for your left ear itching

## 2015-03-28 NOTE — Progress Notes (Signed)
Pre-visit discussion using our clinic review tool. No additional management support is needed unless otherwise documented below in the visit note.  

## 2015-03-29 DIAGNOSIS — J301 Allergic rhinitis due to pollen: Secondary | ICD-10-CM | POA: Diagnosis not present

## 2015-03-29 NOTE — Assessment & Plan Note (Signed)
Managed with adderall.  Dose has been stable. Refills given.      

## 2015-03-29 NOTE — Assessment & Plan Note (Addendum)
Variable control.  Using amlodipine at 2.5 mg daily dose .  She has been asked to check her BP at home.

## 2015-03-29 NOTE — Assessment & Plan Note (Signed)
tolerating lexapro 10 mg, but still requiring use of alprazolam,  not more than once daily

## 2015-03-29 NOTE — Assessment & Plan Note (Signed)
Workup continues for cause,  PFTS are scheduled .  She never started the Advair but reports that  cough has improved since taking oil of oregano and applying to her feet

## 2015-03-30 ENCOUNTER — Other Ambulatory Visit: Payer: Self-pay | Admitting: Internal Medicine

## 2015-03-30 ENCOUNTER — Encounter: Payer: Self-pay | Admitting: Internal Medicine

## 2015-03-30 DIAGNOSIS — Z79899 Other long term (current) drug therapy: Secondary | ICD-10-CM | POA: Diagnosis not present

## 2015-03-30 DIAGNOSIS — Z79891 Long term (current) use of opiate analgesic: Secondary | ICD-10-CM | POA: Diagnosis not present

## 2015-03-30 MED ORDER — ERGOCALCIFEROL 1.25 MG (50000 UT) PO CAPS: 50000.0000 [IU] | ORAL_CAPSULE | ORAL | Status: DC

## 2015-04-04 ENCOUNTER — Ambulatory Visit (INDEPENDENT_AMBULATORY_CARE_PROVIDER_SITE_OTHER): Payer: Medicare Other | Admitting: *Deleted

## 2015-04-04 DIAGNOSIS — J440 Chronic obstructive pulmonary disease with acute lower respiratory infection: Secondary | ICD-10-CM

## 2015-04-04 DIAGNOSIS — R05 Cough: Secondary | ICD-10-CM

## 2015-04-04 DIAGNOSIS — R059 Cough, unspecified: Secondary | ICD-10-CM

## 2015-04-04 LAB — PULMONARY FUNCTION TEST
DL/VA % pred: 84 %
DL/VA: 4.07 ml/min/mmHg/L
DLCO UNC: 20.81 ml/min/mmHg
DLCO unc % pred: 85 %
FEF 25-75 Post: 1.6 L/sec
FEF 25-75 Pre: 1.98 L/sec
FEF2575-%Change-Post: -19 %
FEF2575-%Pred-Post: 83 %
FEF2575-%Pred-Pre: 103 %
FEV1-%CHANGE-POST: -4 %
FEV1-%PRED-PRE: 103 %
FEV1-%Pred-Post: 99 %
FEV1-POST: 2.26 L
FEV1-PRE: 2.37 L
FEV1FVC-%Change-Post: 0 %
FEV1FVC-%Pred-Pre: 100 %
FEV6-%Change-Post: -4 %
FEV6-%PRED-POST: 102 %
FEV6-%PRED-PRE: 107 %
FEV6-POST: 2.96 L
FEV6-PRE: 3.09 L
FEV6FVC-%CHANGE-POST: 0 %
FEV6FVC-%PRED-POST: 103 %
FEV6FVC-%PRED-PRE: 103 %
FVC-%Change-Post: -4 %
FVC-%PRED-POST: 98 %
FVC-%PRED-PRE: 103 %
FVC-POST: 2.98 L
FVC-Pre: 3.1 L
PRE FEV6/FVC RATIO: 100 %
Post FEV1/FVC ratio: 76 %
Post FEV6/FVC ratio: 99 %
Pre FEV1/FVC ratio: 76 %
RV % PRED: 297 %
RV: 6.48 L
TLC % PRED: 191 %
TLC: 9.66 L

## 2015-04-04 NOTE — Progress Notes (Signed)
PFT performed today with Nitrogen washout. 

## 2015-04-05 ENCOUNTER — Ambulatory Visit (INDEPENDENT_AMBULATORY_CARE_PROVIDER_SITE_OTHER): Payer: Medicare Other | Admitting: Pulmonary Disease

## 2015-04-05 ENCOUNTER — Encounter: Payer: Self-pay | Admitting: Pulmonary Disease

## 2015-04-05 VITALS — BP 132/76 | HR 101 | Ht 64.0 in | Wt 122.0 lb

## 2015-04-05 DIAGNOSIS — Z87891 Personal history of nicotine dependence: Secondary | ICD-10-CM

## 2015-04-05 DIAGNOSIS — R0683 Snoring: Secondary | ICD-10-CM

## 2015-04-05 DIAGNOSIS — J301 Allergic rhinitis due to pollen: Secondary | ICD-10-CM | POA: Diagnosis not present

## 2015-04-05 DIAGNOSIS — K209 Esophagitis, unspecified without bleeding: Secondary | ICD-10-CM

## 2015-04-05 DIAGNOSIS — R059 Cough, unspecified: Secondary | ICD-10-CM

## 2015-04-05 DIAGNOSIS — R05 Cough: Secondary | ICD-10-CM

## 2015-04-05 NOTE — Progress Notes (Signed)
PULMONARY CONSULT NOTE  Requesting MD/Service: Derrel Nip, MD/Internal medicine Date of initial consultation: 02/21/15 Reason for consultation: COPD cough  PT PROFILE: 70 yo former smoker (quit 2002) referred by Dr Derrel Nip 02/21/15 for evaluation of subacute cough  INITIAL IMPRESSION/PLAN: Subacute cough, likely COPD/chronic bronchitis. Treated with doxycycline X 7 days, Prednisone 40 mg X 5 days. Begin Advair inhaler as prescribed by Dr Derrel Nip. Continue Protonix as prescribed by Dr Hilarie Fredrickson. Cont Allegra/Zyrtec. Follow up in 4 weeks. We will obtain full lung function tests prior to that office visit  SUBJ: Cough has resolved completely. She is not using Advair and has only used albuterol MDI a couple of time. She does report persistent snoring and hx of OSA. She has not been able to tolerate CPAP previously.   Filed Vitals:   04/05/15 0901  BP: 132/76  Pulse: 101  Height: 5\' 4"  (1.626 m)  Weight: 122 lb (55.339 kg)  SpO2: 97%     EXAM:  Gen: WDWN, No distress HEENT: Normal  Lungs: breath sounds full, no adventitious sounds Cardiovascular: Reg, no murmurs Abdomen: Soft, nontender, normal BS Ext: without clubbing, cyanosis, edema Neuro: grossly intact  DATA:  CXR:  02/01/15: hyperinflated, no acute disease PFTs 04/04/15: Spirometry normal, lung volumes normal, DlCO normal  IMPRESSION:   1) former smoker 2) CXR c/w emphysema (hyperinflation) but PFTs entirely normal 3) Cough, resolved - the resolution might be due to treatment of bronchitis with Doxy and prednisone. More likely, I think, it is due to treatment of esophagitis with PPI 4) Snoring, prior dx of OSA, intolerant to CPAP, minimal daytime hypersomnolence  PLAN:  Cont Protonix as prescribed by Dr Hilarie Fredrickson. She will likely need lifelong antacid therapy Cont antihistamine for chronic rhinosinusitiis May stop Advair (which she never really used this anyway) I offered that I could arrange Sleep Med eval with Dr Ashby Dawes. She  prefers to forgo this for now  Follow up ORN   Wilhelmina Mcardle, MD [facility] Pulmonary, Critical Care Medicine

## 2015-04-07 ENCOUNTER — Telehealth: Payer: Self-pay | Admitting: Internal Medicine

## 2015-04-12 DIAGNOSIS — J301 Allergic rhinitis due to pollen: Secondary | ICD-10-CM | POA: Diagnosis not present

## 2015-04-22 ENCOUNTER — Encounter: Payer: Self-pay | Admitting: Internal Medicine

## 2015-04-25 DIAGNOSIS — J301 Allergic rhinitis due to pollen: Secondary | ICD-10-CM | POA: Diagnosis not present

## 2015-04-26 DIAGNOSIS — J301 Allergic rhinitis due to pollen: Secondary | ICD-10-CM | POA: Diagnosis not present

## 2015-05-04 ENCOUNTER — Encounter: Payer: Self-pay | Admitting: Internal Medicine

## 2015-05-10 DIAGNOSIS — J301 Allergic rhinitis due to pollen: Secondary | ICD-10-CM | POA: Diagnosis not present

## 2015-05-20 ENCOUNTER — Encounter: Payer: Self-pay | Admitting: Internal Medicine

## 2015-05-24 ENCOUNTER — Telehealth: Payer: Self-pay | Admitting: *Deleted

## 2015-05-24 NOTE — Telephone Encounter (Signed)
Please schedule with cook

## 2015-05-24 NOTE — Telephone Encounter (Signed)
Doctor is aware.

## 2015-05-24 NOTE — Telephone Encounter (Signed)
Patient stated that she received a call from someone, it was in reference to her cough.  Pt Contact [phone]

## 2015-05-24 NOTE — Telephone Encounter (Signed)
Have called patient and left 2 messages for patient to return call to office.

## 2015-05-24 NOTE — Telephone Encounter (Signed)
Patient scheduled with Dr.Cook for flu like symptoms chills, cough. FYI

## 2015-05-24 NOTE — Telephone Encounter (Signed)
Please advise, thanks.

## 2015-05-25 ENCOUNTER — Ambulatory Visit: Payer: Medicare Other | Admitting: Family Medicine

## 2015-05-26 ENCOUNTER — Ambulatory Visit: Payer: Medicare Other | Admitting: Family Medicine

## 2015-05-29 ENCOUNTER — Encounter: Payer: Self-pay | Admitting: Family Medicine

## 2015-05-31 ENCOUNTER — Telehealth: Payer: Self-pay | Admitting: Internal Medicine

## 2015-05-31 NOTE — Telephone Encounter (Signed)
Pt sent a Mychart regarding needing to schedule for a AWV. Pt put dates of 3/23 and 3/24. Thank you!

## 2015-06-03 ENCOUNTER — Ambulatory Visit (INDEPENDENT_AMBULATORY_CARE_PROVIDER_SITE_OTHER): Payer: Medicare Other

## 2015-06-03 VITALS — BP 138/80 | HR 96 | Temp 97.1°F | Resp 14 | Ht 62.5 in | Wt 123.1 lb

## 2015-06-03 DIAGNOSIS — Z760 Encounter for issue of repeat prescription: Secondary | ICD-10-CM

## 2015-06-03 DIAGNOSIS — Z0000 Encounter for general adult medical examination without abnormal findings: Secondary | ICD-10-CM

## 2015-06-03 MED ORDER — BENZONATATE 200 MG PO CAPS: 200.0000 mg | ORAL_CAPSULE | Freq: Three times a day (TID) | ORAL | Status: DC | PRN

## 2015-06-03 NOTE — Progress Notes (Signed)
  I have reviewed the above information and agree with above.   Martice Doty, MD 

## 2015-06-03 NOTE — Patient Instructions (Addendum)
[name] Kitchen  Sonya Davis , Thank you for taking time to come for your Medicare Wellness Visit. I appreciate your ongoing commitment to your health goals. Please review the following plan we discussed and let me know if I can assist you in the future.   Follow up with Dr. Derrel Nip as needed.   This is a list of the screening recommended for you and due dates:  Health Maintenance  Topic Date Due  . Flu Shot  01/10/2016*  . Mammogram  10/21/2016  . Colon Cancer Screening  02/15/2018  . Tetanus Vaccine  02/11/2021  . DEXA scan (bone density measurement)  Completed  . Shingles Vaccine  Completed  .  Hepatitis C: One time screening is recommended by Center for Disease Control  (CDC) for  adults born from [birth year] through 1965.   Completed  . Pneumonia vaccines  Completed  *Topic was postponed. The date shown is not the original due date.    Health Maintenance, Female Adopting a healthy lifestyle and getting preventive care can go a long way to promote health and wellness. Talk with your health care provider about what schedule of regular examinations is right for you. This is a good chance for you to check in with your provider about disease prevention and staying healthy. In between checkups, there are plenty of things you can do on your own. Experts have done a lot of research about which lifestyle changes and preventive measures are most likely to keep you healthy. Ask your health care provider for more information. WEIGHT AND DIET  Eat a healthy diet  Be sure to include plenty of vegetables, fruits, low-fat dairy products, and lean protein.  Do not eat a lot of foods high in solid fats, added sugars, or salt.  Get regular exercise. This is one of the most important things you can do for your health.  Most adults should exercise for at least 150 minutes each week. The exercise should increase your heart rate and make you sweat (moderate-intensity exercise).  Most adults should also do strengthening  exercises at least twice a week. This is in addition to the moderate-intensity exercise.  Maintain a healthy weight  Body mass index (BMI) is a measurement that can be used to identify possible weight problems. It estimates body fat based on height and weight. Your health care provider can help determine your BMI and help you achieve or maintain a healthy weight.  For females 2 years of age and older:   A BMI below 18.5 is considered underweight.  A BMI of 18.5 to 24.9 is normal.  A BMI of 25 to 29.9 is considered overweight.  A BMI of 30 and above is considered obese.  Watch levels of cholesterol and blood lipids  You should start having your blood tested for lipids and cholesterol at 70 years of age, then have this test every 5 years.  You may need to have your cholesterol levels checked more often if:  Your lipid or cholesterol levels are high.  You are older than 70 years of age.  You are at high risk for heart disease.  CANCER SCREENING   Lung Cancer  Lung cancer screening is recommended for adults 7-58 years old who are at high risk for lung cancer because of a history of smoking.  A yearly low-dose CT scan of the lungs is recommended for people who:  Currently smoke.  Have quit within the past 15 years.  Have at least a 30-pack-year history of  smoking. A pack year is smoking an average of one pack of cigarettes a day for 1 year.  Yearly screening should continue until it has been 15 years since you quit.  Yearly screening should stop if you develop a health problem that would prevent you from having lung cancer treatment.  Breast Cancer  Practice breast self-awareness. This means understanding how your breasts normally appear and feel.  It also means doing regular breast self-exams. Let your health care provider know about any changes, no matter how small.  If you are in your 20s or 30s, you should have a clinical breast exam (CBE) by a health care  provider every 1-3 years as part of a regular health exam.  If you are 48 or older, have a CBE every year. Also consider having a breast X-ray (mammogram) every year.  If you have a family history of breast cancer, talk to your health care provider about genetic screening.  If you are at high risk for breast cancer, talk to your health care provider about having an MRI and a mammogram every year.  Breast cancer gene (BRCA) assessment is recommended for women who have family members with BRCA-related cancers. BRCA-related cancers include:  Breast.  Ovarian.  Tubal.  Peritoneal cancers.  Results of the assessment will determine the need for genetic counseling and BRCA1 and BRCA2 testing. Cervical Cancer Your health care provider may recommend that you be screened regularly for cancer of the pelvic organs (ovaries, uterus, and vagina). This screening involves a pelvic examination, including checking for microscopic changes to the surface of your cervix (Pap test). You may be encouraged to have this screening done every 3 years, beginning at age 41.  For women ages 75-65, health care providers may recommend pelvic exams and Pap testing every 3 years, or they may recommend the Pap and pelvic exam, combined with testing for human papilloma virus (HPV), every 5 years. Some types of HPV increase your risk of cervical cancer. Testing for HPV may also be done on women of any age with unclear Pap test results.  Other health care providers may not recommend any screening for nonpregnant women who are considered low risk for pelvic cancer and who do not have symptoms. Ask your health care provider if a screening pelvic exam is right for you.  If you have had past treatment for cervical cancer or a condition that could lead to cancer, you need Pap tests and screening for cancer for at least 20 years after your treatment. If Pap tests have been discontinued, your risk factors (such as having a new sexual  partner) need to be reassessed to determine if screening should resume. Some women have medical problems that increase the chance of getting cervical cancer. In these cases, your health care provider may recommend more frequent screening and Pap tests. Colorectal Cancer  This type of cancer can be detected and often prevented.  Routine colorectal cancer screening usually begins at 70 years of age and continues through 70 years of age.  Your health care provider may recommend screening at an earlier age if you have risk factors for colon cancer.  Your health care provider may also recommend using home test kits to check for hidden blood in the stool.  A small camera at the end of a tube can be used to examine your colon directly (sigmoidoscopy or colonoscopy). This is done to check for the earliest forms of colorectal cancer.  Routine screening usually begins at age 47.  Direct examination of the colon should be repeated every 5-10 years through 70 years of age. However, you may need to be screened more often if early forms of precancerous polyps or small growths are found. Skin Cancer  Check your skin from head to toe regularly.  Tell your health care provider about any new moles or changes in moles, especially if there is a change in a mole's shape or color.  Also tell your health care provider if you have a mole that is larger than the size of a pencil eraser.  Always use sunscreen. Apply sunscreen liberally and repeatedly throughout the day.  Protect yourself by wearing long sleeves, pants, a wide-brimmed hat, and sunglasses whenever you are outside. HEART DISEASE, DIABETES, AND HIGH BLOOD PRESSURE   High blood pressure causes heart disease and increases the risk of stroke. High blood pressure is more likely to develop in:  People who have blood pressure in the high end of the normal range (130-139/85-89 mm Hg).  People who are overweight or obese.  People who are African  American.  If you are 69-19 years of age, have your blood pressure checked every 3-5 years. If you are 73 years of age or older, have your blood pressure checked every year. You should have your blood pressure measured twice--once when you are at a hospital or clinic, and once when you are not at a hospital or clinic. Record the average of the two measurements. To check your blood pressure when you are not at a hospital or clinic, you can use:  An automated blood pressure machine at a pharmacy.  A home blood pressure monitor.  If you are between 20 years and 2 years old, ask your health care provider if you should take aspirin to prevent strokes.  Have regular diabetes screenings. This involves taking a blood sample to check your fasting blood sugar level.  If you are at a normal weight and have a low risk for diabetes, have this test once every three years after 70 years of age.  If you are overweight and have a high risk for diabetes, consider being tested at a younger age or more often. PREVENTING INFECTION  Hepatitis B  If you have a higher risk for hepatitis B, you should be screened for this virus. You are considered at high risk for hepatitis B if:  You were born in a country where hepatitis B is common. Ask your health care provider which countries are considered high risk.  Your parents were born in a high-risk country, and you have not been immunized against hepatitis B (hepatitis B vaccine).  You have HIV or AIDS.  You use needles to inject street drugs.  You live with someone who has hepatitis B.  You have had sex with someone who has hepatitis B.  You get hemodialysis treatment.  You take certain medicines for conditions, including cancer, organ transplantation, and autoimmune conditions. Hepatitis C  Blood testing is recommended for:  Everyone born from [birth year] through 1965.  Anyone with known risk factors for hepatitis C. Sexually transmitted infections  (STIs)  You should be screened for sexually transmitted infections (STIs) including gonorrhea and chlamydia if:  You are sexually active and are younger than 70 years of age.  You are older than 70 years of age and your health care provider tells you that you are at risk for this type of infection.  Your sexual activity has changed since you were last screened and you are  at an increased risk for chlamydia or gonorrhea. Ask your health care provider if you are at risk.  If you do not have HIV, but are at risk, it may be recommended that you take a prescription medicine daily to prevent HIV infection. This is called pre-exposure prophylaxis (PrEP). You are considered at risk if:  You are sexually active and do not regularly use condoms or know the HIV status of your partner(s).  You take drugs by injection.  You are sexually active with a partner who has HIV. Talk with your health care provider about whether you are at high risk of being infected with HIV. If you choose to begin PrEP, you should first be tested for HIV. You should then be tested every 3 months for as long as you are taking PrEP.  PREGNANCY   If you are premenopausal and you may become pregnant, ask your health care provider about preconception counseling.  If you may become pregnant, take 400 to 800 micrograms (mcg) of folic acid every day.  If you want to prevent pregnancy, talk to your health care provider about birth control (contraception). OSTEOPOROSIS AND MENOPAUSE   Osteoporosis is a disease in which the bones lose minerals and strength with aging. This can result in serious bone fractures. Your risk for osteoporosis can be identified using a bone density scan.  If you are 31 years of age or older, or if you are at risk for osteoporosis and fractures, ask your health care provider if you should be screened.  Ask your health care provider whether you should take a calcium or vitamin D supplement to lower your risk  for osteoporosis.  Menopause may have certain physical symptoms and risks.  Hormone replacement therapy may reduce some of these symptoms and risks. Talk to your health care provider about whether hormone replacement therapy is right for you.  HOME CARE INSTRUCTIONS   Schedule regular health, dental, and eye exams.  Stay current with your immunizations.   Do not use any tobacco products including cigarettes, chewing tobacco, or electronic cigarettes.  If you are pregnant, do not drink alcohol.  If you are breastfeeding, limit how much and how often you drink alcohol.  Limit alcohol intake to no more than 1 drink per day for nonpregnant women. One drink equals 12 ounces of beer, 5 ounces of wine, or 1 ounces of hard liquor.  Do not use street drugs.  Do not share needles.  Ask your health care provider for help if you need support or information about quitting drugs.  Tell your health care provider if you often feel depressed.  Tell your health care provider if you have ever been abused or do not feel safe at home.   This information is not intended to replace advice given to you by your health care provider. Make sure you discuss any questions you have with your health care provider.   Document Released: 09/11/2010 Document Revised: 03/19/2014 Document Reviewed: 01/28/2013 Elsevier Interactive Patient Education [employer].

## 2015-06-03 NOTE — Progress Notes (Signed)
Subjective:   Sonya Davis is a 70 y.o. female who presents for Medicare Annual (Subsequent) preventive examination.  Review of Systems:  No ROS.  Medicare Wellness Visit. Cardiac Risk Factors include: advanced age (>36men, >75 women);hypertension     Objective:     Vitals: BP 138/80 mmHg  Pulse 96  Temp(Src) 97.1 F (36.2 C) (Oral)  Resp 14  Ht 5' 2.5" (1.588 m)  Wt 123 lb 1.9 oz (55.847 kg)  BMI 22.15 kg/m2  SpO2 98%  Body mass index is 22.15 kg/(m^2).   Tobacco History  Smoking status  . Former Smoker  . Types: Cigarettes  . Quit date: 02/11/2001  Smokeless tobacco  . Never Used     Counseling given: Not Answered   Past Medical History  Diagnosis Date  . Gastritis   . 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 Rheumatoid arthritis(714.0) 2000    per patient by labs  . Ovarian cyst     right, endometriosis  . ADD (attention deficit disorder)   . Fracture of bone of left shoulder 1981  . Fracture closed, pubis ([place]) 1981  . Fracture of maxilla ([place]) 1981  . History of ovarian cyst   . Endometriosis of ovary     prior GYN [name]  . History of pericarditis 1988    occurred after flu shot, with pneumonia  . GERD (gastroesophageal reflux disease)   . Allergy     seasonal  . Cataract     surgery to remove them  . Osteoporosis   . Pericarditis     does not receive flu vaccine becuase of this   Past Surgical History  Procedure Laterality Date  . Anterior cruciate ligament repair  age 24    left  . Carpal tunnel release      bilateral surgeries  . Arthroscopic repair acl  1999    left knee  . Abdominal hysterectomy      secondary to endometriosis  . Eye surgery  2005    left cataract   . Cataract extraction  Mar 2013    right eye, [place]  . Septoplasty  Dec 2006    [name],   . Cystectomy  1963  . Appendectomy      at age 7  . Septoplasty  2006   Family History    Problem Relation Age of Onset  . Cancer Mother     lung  . Heart disease Father   . Crohn's disease Sister   . Breast cancer Cousin   . Colon cancer Neg Hx    History  Sexual Activity  . Sexual Activity: No    Outpatient Encounter Prescriptions as of 06/03/2015  Medication Sig  . albuterol (PROVENTIL HFA;VENTOLIN HFA) 108 (90 BASE) MCG/ACT inhaler Inhale 2 puffs into the lungs every 6 (six) hours as needed for wheezing or shortness of breath.  . ALPRAZolam (XANAX) 0.5 MG tablet Take 1 tablet (0.5 mg total) by mouth at bedtime as needed for anxiety.  [name] Kitchen amLODipine (NORVASC) 2.5 MG tablet TAKE ONE TABLET EVERY DAY  . amphetamine-dextroamphetamine (ADDERALL) 20 MG tablet Take 1 tablet (20 mg total) by mouth 2 (two) times daily.  . benzonatate (TESSALON) 200 MG capsule Take 1 capsule (200 mg total) by mouth 3 (three) times daily as needed.  . calcium carbonate (OS-CAL) 600 MG TABS Take 600 mg by mouth 2 (two) times daily with a meal.    .  cetirizine (ZYRTEC) 10 MG tablet Take 10 mg by mouth daily.  . cholecalciferol (VITAMIN D) 1000 UNITS tablet Take 1,000 Units by mouth daily.    [name] Calcium (STOOL SOFTENER PO) Take by mouth.    . doxycycline (VIBRA-TABS) 100 MG tablet Take 1 tablet (100 mg total) by mouth 2 (two) times daily.  . ergocalciferol (DRISDOL) 50000 units capsule Take 1 capsule (50,000 Units total) by mouth once a week.  . fexofenadine (ALLEGRA) 180 MG tablet Take 1 tablet (180 mg total) by mouth daily.  . Fluticasone-Salmeterol (ADVAIR DISKUS) 250-50 MCG/DOSE AEPB Inhale 1 puff into the lungs 2 (two) times daily.  . furosemide (LASIX) 20 MG tablet Take 1 tablet (20 mg total) by mouth daily as needed.  [name] Kitchen HYDROcodone-acetaminophen (NORCO/VICODIN) 5-325 MG tablet Take 1 tablet by mouth every 6 (six) hours as needed. Maximum dose twice daily  . mometasone (ELOCON) 0.1 % cream Apply 1 application topically daily. To ear canal  . pantoprazole (PROTONIX) 40 MG tablet Take 1  tablet (40 mg total) by mouth daily.  . [DISCONTINUED] benzonatate (TESSALON) 200 MG capsule Take 1 capsule (200 mg total) by mouth 3 (three) times daily as needed.   No facility-administered encounter medications on file as of 06/03/2015.    Activities of Daily Living In your present state of health, do you have any difficulty performing the following activities: 06/03/2015  Hearing? Y  Vision? N  Difficulty concentrating or making decisions? N  Walking or climbing stairs? N  Dressing or bathing? N  Doing errands, shopping? N  Preparing Food and eating ? N  Using the Toilet? N  In the past six months, have you accidently leaked urine? N  Do you have problems with loss of bowel control? N  Managing your Medications? N  Managing your Finances? N  Housekeeping or managing your Housekeeping? N    Patient Care Team: Crecencio Mc, MD as PCP - General (Internal Medicine)    Assessment:   This is a routine wellness examination for Sonya Davis. The goal of the wellness visit is to assist the patient how to close the gaps in care and create a preventative care plan for the patient.   Taking VIT D Calcium appropriate/Osteoporosis risk reviewed.  Medications reviewed; taking without issues or barriers.  Safety issues reviewed; smoke detectors in the home. Firearms locked in a safe area in the home. Wears seatbelts when driving or riding with others. No violence in the home.  No identified risk were noted; The patient was oriented x 3; appropriate in dress and manner and no objective failures at ADL's or IADL's.   Fx malar/maxillary-close-stable and followed by PCP  Patient Concerns:  C/O continued cough; TESSALON refilled; follow up appointment scheduled with PCP.  Requested copy of Pulmonary Function Test results completed 04/04/15; she stated she did receive verbal feedback of results, but wanted a copy for her personal records.  Given to patient prior to leaving.    Exercise  Activities and Dietary recommendations Current Exercise Habits: The patient does not participate in regular exercise at present  Goals    . Increase physical activity     Walk 3 times weekly, 30 minute sessions.      Fall Risk Fall Risk  06/03/2015 02/26/2014 03/11/2012  Falls in the past year? Yes No No  Number falls in past yr: 1 - -  Injury with Fall? No - -  Follow up Education provided;Falls prevention discussed - -   Depression Screen PHQ  2/9 Scores 06/03/2015 02/26/2014 03/11/2012  PHQ - 2 Score 0 0 0     Cognitive Testing MMSE - Mini Mental State Exam 06/03/2015  Orientation to time 5  Orientation to Place 5  Registration 3  Attention/ Calculation 5  Recall 3  Language- name 2 objects 2  Language- repeat 1  Language- follow 3 step command 3  Language- read & follow direction 1  Write a sentence 1  Copy design 1  Total score 30    Immunization History  Administered Date(s) Administered  . Pneumococcal Conjugate-13 09/17/2014  . Pneumococcal Polysaccharide-23 02/12/2011  . Tdap 02/12/2011  . Zoster 09/03/2012   Screening Tests Health Maintenance  Topic Date Due  . INFLUENZA VACCINE  01/10/2016 (Originally 10/11/2014)  . MAMMOGRAM  10/21/2016  . COLONOSCOPY  02/15/2018  . TETANUS/TDAP  02/11/2021  . DEXA SCAN  Completed  . ZOSTAVAX  Completed  . Hepatitis C Screening  Completed  . PNA vac Low Risk Adult  Completed      Plan:   End of life planning; Advance aging; Advanced directives discussed. Educational material provided to help her start the conversation with her family.  Copy of HCPOA/Living Will requested upon completion. Total time spent discussing Advanced Directives is 20 minutes.   During the course of the visit the patient was educated and counseled about the following appropriate screening and preventive services:   Vaccines to include Pneumoccal, Influenza, Hepatitis B, Td, Zostavax, HCV  Electrocardiogram  Cardiovascular  Disease  Colorectal cancer screening  Bone density screening  Diabetes screening  Glaucoma screening  Mammography/PAP  Nutrition counseling   Patient Instructions (the written plan) was given to the patient.   Varney Biles, LPN  [id number]

## 2015-06-08 DIAGNOSIS — R05 Cough: Secondary | ICD-10-CM | POA: Diagnosis not present

## 2015-06-23 ENCOUNTER — Encounter: Payer: Self-pay | Admitting: Internal Medicine

## 2015-06-23 ENCOUNTER — Ambulatory Visit (INDEPENDENT_AMBULATORY_CARE_PROVIDER_SITE_OTHER): Payer: Medicare Other | Admitting: Internal Medicine

## 2015-06-23 VITALS — BP 142/87 | HR 84 | Temp 98.0°F | Ht 63.0 in | Wt 122.5 lb

## 2015-06-23 DIAGNOSIS — I10 Essential (primary) hypertension: Secondary | ICD-10-CM | POA: Diagnosis not present

## 2015-06-23 DIAGNOSIS — R5383 Other fatigue: Secondary | ICD-10-CM | POA: Diagnosis not present

## 2015-06-23 DIAGNOSIS — M810 Age-related osteoporosis without current pathological fracture: Secondary | ICD-10-CM

## 2015-06-23 DIAGNOSIS — M159 Polyosteoarthritis, unspecified: Secondary | ICD-10-CM

## 2015-06-23 DIAGNOSIS — F900 Attention-deficit hyperactivity disorder, predominantly inattentive type: Secondary | ICD-10-CM | POA: Diagnosis not present

## 2015-06-23 DIAGNOSIS — G4733 Obstructive sleep apnea (adult) (pediatric): Secondary | ICD-10-CM | POA: Diagnosis not present

## 2015-06-23 DIAGNOSIS — M150 Primary generalized (osteo)arthritis: Secondary | ICD-10-CM

## 2015-06-23 DIAGNOSIS — F909 Attention-deficit hyperactivity disorder, unspecified type: Secondary | ICD-10-CM

## 2015-06-23 MED ORDER — TRAZODONE HCL 50 MG PO TABS
25.0000 mg | ORAL_TABLET | Freq: Every evening | ORAL | Status: DC | PRN
Start: 2015-06-23 — End: 2015-10-10

## 2015-06-23 MED ORDER — LISDEXAMFETAMINE DIMESYLATE 30 MG PO CAPS: 30.0000 mg | ORAL_CAPSULE | Freq: Every day | ORAL | Status: DC

## 2015-06-23 MED ORDER — HYDROCODONE-ACETAMINOPHEN 5-325 MG PO TABS: 1.0000 | ORAL_TABLET | Freq: Four times a day (QID) | ORAL | Status: DC | PRN

## 2015-06-23 MED ORDER — AMLODIPINE BESYLATE 2.5 MG PO TABS: 2.5000 mg | ORAL_TABLET | Freq: Every day | ORAL | Status: DC

## 2015-06-23 MED ORDER — LISDEXAMFETAMINE DIMESYLATE 40 MG PO CAPS: 40.0000 mg | ORAL_CAPSULE | Freq: Every day | ORAL | Status: DC

## 2015-06-23 NOTE — Patient Instructions (Addendum)
Resume amlodipine starting at 2.5 mg daily  Return in one week for BP check.    Start taking 2,000 IUs of D3 daily   starting trazodone at night for insomnia  Changing adderall twice daily to  Vyvanse  40 mg once daily in the morning   I would like you to try another sleep study since treatment now is better with less reliance on masks

## 2015-06-23 NOTE — Progress Notes (Signed)
Subjective:  Patient ID: Sonya Davis, female    DOB: [birth year]  Age: 70 y.o. MRN: [id number]  CC: The primary encounter diagnosis was Essential hypertension. Diagnoses of Attention deficit disorder of adult with hyperactivity, Other fatigue, OSA (obstructive sleep apnea), Osteoporosis, and Primary osteoarthritis involving multiple joints were also pertinent to this visit.  HPI Sonya Davis presents for follow up on chronic conditions including ADD,  Chronic pain , and hypertension.  HTN: no longer taking amlodipine 2.5 mg daily after 30 day rx ran out.   ADD:  She is not concentrating well on current medication and Wants to change to vyvanse. Last adderall dose was yesterday .  Feels dysfunctional on current dose ,  Not getting things done. Causing her a lot of anxiety because her house is cluttered.   No energy.  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  Had laser treatments of uvula back in the 90's. Has never tried the nasal pillows.   Outpatient Prescriptions Prior to Visit  Medication Sig Dispense Refill  . albuterol (PROVENTIL HFA;VENTOLIN HFA) 108 (90 BASE) MCG/ACT inhaler Inhale 2 puffs into the lungs every 6 (six) hours as needed for wheezing or shortness of breath. 1 Inhaler 2  . ALPRAZolam (XANAX) 0.5 MG tablet Take 1 tablet (0.5 mg total) by mouth at bedtime as needed for anxiety. 30 tablet 5  . benzonatate (TESSALON) 200 MG capsule Take 1 capsule (200 mg total) by mouth 3 (three) times daily as needed. 60 capsule 3  . calcium carbonate (OS-CAL) 600 MG TABS Take 600 mg by mouth 2 (two) times daily with a meal.      . cetirizine (ZYRTEC) 10 MG tablet Take 10 mg by mouth daily.    . cholecalciferol (VITAMIN D) 1000 UNITS tablet Take 1,000 Units by mouth daily.      [name] Calcium (STOOL SOFTENER PO) Take by mouth.      . fexofenadine (ALLEGRA)  180 MG tablet Take 1 tablet (180 mg total) by mouth daily. 90 tablet 1  . Fluticasone-Salmeterol (ADVAIR DISKUS) 250-50 MCG/DOSE AEPB Inhale 1 puff into the lungs 2 (two) times daily. 1 each 3  . furosemide (LASIX) 20 MG tablet Take 1 tablet (20 mg total) by mouth daily as needed. 30 tablet 3  . mometasone (ELOCON) 0.1 % cream Apply 1 application topically daily. To ear canal 45 g 0  . pantoprazole (PROTONIX) 40 MG tablet Take 1 tablet (40 mg total) by mouth daily. 30 tablet 3  . amLODipine (NORVASC) 2.5 MG tablet TAKE ONE TABLET EVERY DAY 30 tablet 0  . amphetamine-dextroamphetamine (ADDERALL) 20 MG tablet Take 1 tablet (20 mg total) by mouth 2 (two) times daily. 60 tablet 0  . doxycycline (VIBRA-TABS) 100 MG tablet Take 1 tablet (100 mg total) by mouth 2 (two) times daily. 14 tablet 0  . ergocalciferol (DRISDOL) 50000 units capsule Take 1 capsule (50,000 Units total) by mouth once a week. 12 capsule 0  . HYDROcodone-acetaminophen (NORCO/VICODIN) 5-325 MG tablet Take 1 tablet by mouth every 6 (six) hours as needed. Maximum dose twice daily 60 tablet 0   No facility-administered medications prior to visit.    Review of Systems;  Patient denies headache, fevers, malaise, unintentional weight loss, skin rash, eye pain, sinus congestion and sinus pain, sore throat, dysphagia,  hemoptysis ,  cough, dyspnea, wheezing, chest pain, palpitations, orthopnea, edema, abdominal pain, nausea, melena, diarrhea, constipation, flank pain, dysuria, hematuria, urinary  Frequency, nocturia, numbness, tingling, seizures,  Focal weakness, Loss of consciousness,  Tremor, insomnia, depression, anxiety, and suicidal ideation.      Objective:  BP 142/87 mmHg  Pulse 84  Temp(Src) 98 F (36.7 C) (Oral)  Ht 5\' 3"  (1.6 m)  Wt 122 lb 8 oz (55.566 kg)  BMI 21.71 kg/m2  SpO2 97%  BP Readings from Last 3 Encounters:  06/23/15 142/87  06/03/15 138/80  04/05/15 132/76    Wt Readings from Last 3 Encounters:    06/23/15 122 lb 8 oz (55.566 kg)  06/03/15 123 lb 1.9 oz (55.847 kg)  04/05/15 122 lb (55.339 kg)    General appearance: alert, cooperative and appears stated age Ears: normal TM's and external ear canals both ears Throat: lips, mucosa, and tongue normal; teeth and gums normal Neck: no adenopathy, no carotid bruit, supple, symmetrical, trachea midline and thyroid not enlarged, symmetric, no tenderness/mass/nodules Back: symmetric, no curvature. ROM normal. No CVA tenderness. Lungs: clear to auscultation bilaterally Heart: regular rate and rhythm, S1, S2 normal, no murmur, click, rub or gallop Abdomen: soft, non-tender; bowel sounds normal; no masses,  no organomegaly Pulses: 2+ and symmetric Skin: Skin color, texture, turgor normal. No rashes or lesions Lymph nodes: Cervical, supraclavicular, and axillary nodes normal.  No results found for: HGBA1C  Lab Results  Component Value Date   CREATININE 0.78 03/28/2015   CREATININE 0.92 09/17/2014   CREATININE 1.0 08/07/2013    Lab Results  Component Value Date   WBC 7.3 03/28/2015   HGB 13.8 03/28/2015   HCT 41.8 03/28/2015   PLT 339.0 03/28/2015   GLUCOSE 82 03/28/2015   CHOL 238* 09/17/2014   TRIG 157.0* 09/17/2014   HDL 69.40 09/17/2014   LDLDIRECT 141.2 01/10/2012   LDLCALC 137* 09/17/2014   ALT 16 03/28/2015   AST 24 03/28/2015   NA 140 03/28/2015   K 3.7 03/28/2015   CL 101 03/28/2015   CREATININE 0.78 03/28/2015   BUN 17 03/28/2015   CO2 25 03/28/2015   TSH 0.58 09/17/2014   MICROALBUR 0.3 09/03/2012    Dg Chest 2 View  02/01/2015  CLINICAL DATA:  Productive cough for 3-4 months, worsening recently, former smoking history EXAM: CHEST  2 VIEW COMPARISON:  Chest x-ray of 05/11/2011 FINDINGS: The lungs are markedly hyperaerated with flattened hemidiaphragms and increased AP diameter consistent with emphysema. No infiltrate or effusion is seen. Mediastinal and hilar contours are unremarkable. The heart is within  normal limits in size. No bony abnormality is seen. IMPRESSION: Emphysema.  No active lung disease. Electronically Signed   By: Ivar Drape M.D.   On: 02/01/2015 10:55    Assessment & Plan:   Problem List Items Addressed This Visit    Osteoarthritis    With chronic daily pain secondary to remote fractures.  Patient has not had an escalation of use of narcotics and is reminded not to share medication with others or to combine with alcohol or other sedating medications.  Patient is providing urine sample upon request per narcotics contract, Refill on narcotic was given today. Refill on vicodin given . Will return in  3 months for follow up for future refills .         Relevant Medications   HYDROcodone-acetaminophen (NORCO/VICODIN) 5-325 MG tablet   Attention deficit disorder of adult with hyperactivity    Sympotms of poor concentration and hyperactivity not  well controlled currently.  Change to Vyvanse requested      OSA (obstructive sleep apnea)    Repeat study ordered given persistentt fatigue and new onset hypertension      Relevant Orders   Ambulatory referral to Sleep Studies   Osteoporosis    By recent DEXA scan Oct 2016.  No prior treatment . Will have her return for discussion of treatment options       Essential hypertension - Primary    Advised to  resume amlodipine.       Relevant Medications   amLODipine (NORVASC) 2.5 MG tablet   Other Relevant Orders   Ambulatory referral to Sleep Studies   Fatigue   Relevant Orders   Ambulatory referral to Sleep Studies      I have discontinued Ms. Neverson doxycycline, amphetamine-dextroamphetamine, and ergocalciferol. I have also changed her amLODipine and lisdexamfetamine. Additionally, I am having her start on traZODone. Lastly, I am having her maintain her calcium carbonate, cholecalciferol, Docusate Calcium (STOOL SOFTENER PO), furosemide, cetirizine, fexofenadine, Fluticasone-Salmeterol, albuterol, pantoprazole,  ALPRAZolam, mometasone, benzonatate, and HYDROcodone-acetaminophen.  Meds ordered this encounter  Medications  . amLODipine (NORVASC) 2.5 MG tablet    Sig: Take 1 tablet (2.5 mg total) by mouth daily.    Dispense:  90 tablet    Refill:  1  . DISCONTD: lisdexamfetamine (VYVANSE) 30 MG capsule    Sig: Take 1 capsule (30 mg total) by mouth daily.    Dispense:  30 capsule    Refill:  0  . lisdexamfetamine (VYVANSE) 40 MG capsule    Sig: Take 1 capsule (40 mg total) by mouth daily.    Dispense:  30 capsule    Refill:  0  . traZODone (DESYREL) 50 MG tablet    Sig: Take 0.5-1 tablets (25-50 mg total) by mouth at bedtime as needed for sleep.    Dispense:  30 tablet    Refill:  3  . HYDROcodone-acetaminophen (NORCO/VICODIN) 5-325 MG tablet    Sig: Take 1 tablet by mouth every 6 (six) hours as needed. Maximum dose twice daily    Dispense:  60 tablet    Refill:  0    May refill on or after July 02 2015    A total of 25 minutes of face to face time was spent with patient more than half of which was spent in counselling and coordination of care    Medications Discontinued During This Encounter  Medication Reason  . doxycycline (VIBRA-TABS) 100 MG tablet Completed Course  . ergocalciferol (DRISDOL) 50000 units capsule Completed Course  . amLODipine (NORVASC) 2.5 MG tablet Reorder  . lisdexamfetamine (VYVANSE) 30 MG capsule Reorder  . HYDROcodone-acetaminophen (NORCO/VICODIN) 5-325 MG tablet Reorder  . amphetamine-dextroamphetamine (ADDERALL) 20 MG tablet     Follow-up: Return in about 1 month (around 07/23/2015) for 1 week RN VISIT FOR BP CHECK .   Crecencio Mc, MD

## 2015-06-23 NOTE — Progress Notes (Signed)
Pre visit review using our clinic review tool, if applicable. No additional management support is needed unless otherwise documented below in the visit note. 

## 2015-06-25 DIAGNOSIS — R5383 Other fatigue: Secondary | ICD-10-CM | POA: Insufficient documentation

## 2015-06-25 NOTE — Assessment & Plan Note (Signed)
Repeat study ordered given persistentt fatigue and new onset hypertension

## 2015-06-25 NOTE — Assessment & Plan Note (Signed)
Advised to resume amlodipine  

## 2015-06-25 NOTE — Assessment & Plan Note (Addendum)
Sympotms of poor concentration and hyperactivity not well controlled currently.  Change to Vyvanse requested

## 2015-06-25 NOTE — Assessment & Plan Note (Addendum)
With chronic daily pain secondary to remote fractures.  Patient has not had an escalation of use of narcotics and is reminded not to share medication with others or to combine with alcohol or other sedating medications.  Patient is providing urine sample upon request per narcotics contract, Refill on narcotic was given today. Refill on vicodin given . Will return in  3 months for follow up for future refills .

## 2015-06-25 NOTE — Assessment & Plan Note (Addendum)
By recent DEXA scan Oct 2016.  No prior treatment . Will have her return for discussion of treatment options

## 2015-06-30 ENCOUNTER — Other Ambulatory Visit: Payer: Self-pay

## 2015-06-30 ENCOUNTER — Ambulatory Visit (INDEPENDENT_AMBULATORY_CARE_PROVIDER_SITE_OTHER): Payer: Medicare Other

## 2015-06-30 DIAGNOSIS — I10 Essential (primary) hypertension: Secondary | ICD-10-CM

## 2015-06-30 MED ORDER — SILVER SULFADIAZINE 1 % EX CREA: 1.0000 "application " | TOPICAL_CREAM | Freq: Every day | CUTANEOUS | Status: DC

## 2015-06-30 NOTE — Progress Notes (Signed)
Patient came in to have her BP rechecked.  Patient states she is taking all her medications as prescribed.  Checked Vitals in bilateral extremities, see vitals section for details.    Patient also showed RN a burn to her right forearm.  Area is redden, slightly swollen.  Visible blisters noted that are intact.  Conferred with Dr. Derrel Nip, and redressed the wound.  Advised patient to continue to dress the wound and apply ointment as prescribed per Dr. Derrel Nip.    Please advise.

## 2015-06-30 NOTE — Progress Notes (Signed)
  I have reviewed the above information and agree with above.   Marcelia Petersen, MD 

## 2015-07-13 DIAGNOSIS — F909 Attention-deficit hyperactivity disorder, unspecified type: Secondary | ICD-10-CM

## 2015-07-20 DIAGNOSIS — L255 Unspecified contact dermatitis due to plants, except food: Secondary | ICD-10-CM | POA: Diagnosis not present

## 2015-07-22 ENCOUNTER — Other Ambulatory Visit: Payer: Self-pay

## 2015-07-22 ENCOUNTER — Other Ambulatory Visit: Payer: Self-pay | Admitting: Internal Medicine

## 2015-07-22 MED ORDER — HYDROCODONE-ACETAMINOPHEN 5-325 MG PO TABS: 1.0000 | ORAL_TABLET | Freq: Four times a day (QID) | ORAL | Status: DC | PRN

## 2015-07-22 MED ORDER — LISDEXAMFETAMINE DIMESYLATE 40 MG PO CAPS: 40.0000 mg | ORAL_CAPSULE | Freq: Every day | ORAL | Status: DC

## 2015-07-22 MED ORDER — SILVER SULFADIAZINE 1 % EX CREA: 1.0000 "application " | TOPICAL_CREAM | Freq: Every day | CUTANEOUS | Status: DC

## 2015-07-22 NOTE — Telephone Encounter (Signed)
Pt requesting a refill on the following meds. Last filled on 06/23/15 last OV 06/23/15. Ok to refill?

## 2015-07-22 NOTE — Telephone Encounter (Signed)
Refillled,  The vicodin cannot be refilled until may 22 but rx printed

## 2015-07-25 ENCOUNTER — Encounter: Payer: Self-pay | Admitting: Family Medicine

## 2015-07-25 ENCOUNTER — Ambulatory Visit (INDEPENDENT_AMBULATORY_CARE_PROVIDER_SITE_OTHER): Payer: Medicare Other | Admitting: Family Medicine

## 2015-07-25 VITALS — BP 138/80 | HR 92 | Temp 98.4°F | Wt 121.8 lb

## 2015-07-25 DIAGNOSIS — H6690 Otitis media, unspecified, unspecified ear: Secondary | ICD-10-CM | POA: Insufficient documentation

## 2015-07-25 DIAGNOSIS — H6692 Otitis media, unspecified, left ear: Secondary | ICD-10-CM

## 2015-07-25 MED ORDER — SILVER SULFADIAZINE 1 % EX CREA: 1.0000 "application " | TOPICAL_CREAM | Freq: Every day | CUTANEOUS | Status: DC

## 2015-07-25 MED ORDER — HYDROCODONE-ACETAMINOPHEN 5-325 MG PO TABS: 1.0000 | ORAL_TABLET | Freq: Four times a day (QID) | ORAL | Status: DC | PRN

## 2015-07-25 MED ORDER — LISDEXAMFETAMINE DIMESYLATE 40 MG PO CAPS: 40.0000 mg | ORAL_CAPSULE | Freq: Every day | ORAL | Status: DC

## 2015-07-25 MED ORDER — AMOXICILLIN 500 MG PO CAPS
500.0000 mg | ORAL_CAPSULE | Freq: Two times a day (BID) | ORAL | Status: DC
Start: 2015-07-25 — End: 2015-08-29

## 2015-07-25 NOTE — Addendum Note (Signed)
Addended by: Crecencio Mc on: 07/25/2015 09:09 PM   Modules accepted: Orders

## 2015-07-25 NOTE — Patient Instructions (Signed)
It was nice to see you today.  Take the amoxicillin as prescribed.   Follow up:  As needed  Take care  Dr. Lacinda Axon  Otitis Media, Adult Otitis media is redness, soreness, and inflammation of the middle ear. Otitis media may be caused by allergies or, most commonly, by infection. Often it occurs as a complication of the common cold. SIGNS AND SYMPTOMS Symptoms of otitis media may include:  Earache.  Fever.  Ringing in your ear.  Headache.  Leakage of fluid from the ear. DIAGNOSIS To diagnose otitis media, your health care provider will examine your ear with an otoscope. This is an instrument that allows your health care provider to see into your ear in order to examine your eardrum. Your health care provider also will ask you questions about your symptoms. TREATMENT  Typically, otitis media resolves on its own within 3-5 days. Your health care provider may prescribe medicine to ease your symptoms of pain. If otitis media does not resolve within 5 days or is recurrent, your health care provider may prescribe antibiotic medicines if he or she suspects that a bacterial infection is the cause. HOME CARE INSTRUCTIONS   If you were prescribed an antibiotic medicine, finish it all even if you start to feel better.  Take medicines only as directed by your health care provider.  Keep all follow-up visits as directed by your health care provider. SEEK MEDICAL CARE IF:  You have otitis media only in one ear, or bleeding from your nose, or both.  You notice a lump on your neck.  You are not getting better in 3-5 days.  You feel worse instead of better. SEEK IMMEDIATE MEDICAL CARE IF:   You have pain that is not controlled with medicine.  You have swelling, redness, or pain around your ear or stiffness in your neck.  You notice that part of your face is paralyzed.  You notice that the bone behind your ear (mastoid) is tender when you touch it. MAKE SURE YOU:   Understand these  instructions.  Will watch your condition.  Will get help right away if you are not doing well or get worse.   This information is not intended to replace advice given to you by your health care provider. Make sure you discuss any questions you have with your health care provider.   Document Released: 12/02/2003 Document Revised: 03/19/2014 Document Reviewed: 09/23/2012 Elsevier Interactive Patient Education [employer].

## 2015-07-25 NOTE — Assessment & Plan Note (Signed)
New problem. Treating with Amoxicillin.

## 2015-07-25 NOTE — Progress Notes (Signed)
   Subjective:  Patient ID: Sonya Davis, female    DOB: 03/10/1946  Age: 70 y.o. MRN: [id number]  CC: Left ear pain, neck pain, cough  HPI:  70 year old female presents with the above complaints.  Patient reports She has not felt well since Saturday. She states that she's been experiencing severe left ear pain with radiation down her neck. She is also has some cough with associated pleuritic pain. Cough is nonproductive. No associated fevers or chills. No known exacerbating or relieving factors. No other complaints this time.  Social Hx   Social History   Social History  . Marital Status: Single    Spouse Name: N/A  . Number of Children: N/A  . Years of Education: N/A   Social History Main Topics  . Smoking status: Former Smoker    Types: Cigarettes    Quit date: 02/11/2001  . Smokeless tobacco: Never Used  . Alcohol Use: 3.0 oz/week    5 Standard drinks or equivalent per week  . Drug Use: No  . Sexual Activity: No   Other Topics Concern  . None   Social History Narrative   Review of Systems  Constitutional: Negative.   HENT: Positive for ear pain.   Respiratory: Positive for cough.    Objective:  BP 138/80 mmHg  Pulse 92  Temp(Src) 98.4 F (36.9 C) (Oral)  Wt 121 lb 12.8 oz (55.248 kg)  SpO2 96%  BP/Weight 07/25/2015 06/23/2015 [id number]  Systolic BP 0000000 [id number] 0000000  Diastolic BP 80 87 80  Wt. (Lbs) 121.8 122.5 123.12  BMI 21.58 21.71 22.15   Physical Exam  Constitutional: She appears well-developed. No distress.  HENT:  Head: Normocephalic and atraumatic.  Mouth/Throat: Oropharynx is clear and moist.  Left TM - superior region with erythema and bulging.  Cardiovascular: Normal rate and regular rhythm.   Pulmonary/Chest: Effort normal. She has no wheezes. She has no rales.  Anterior chest wall and lower ribs tender to palpation  Neurological: She is alert.  Vitals reviewed.  Lab Results  Component Value Date   WBC 7.3 03/28/2015   HGB 13.8 03/28/2015     HCT 41.8 03/28/2015   PLT 339.0 03/28/2015   GLUCOSE 82 03/28/2015   CHOL 238* 09/17/2014   TRIG 157.0* 09/17/2014   HDL 69.40 09/17/2014   LDLDIRECT 141.2 01/10/2012   LDLCALC 137* 09/17/2014   ALT 16 03/28/2015   AST 24 03/28/2015   NA 140 03/28/2015   K 3.7 03/28/2015   CL 101 03/28/2015   CREATININE 0.78 03/28/2015   BUN 17 03/28/2015   CO2 25 03/28/2015   TSH 0.58 09/17/2014   MICROALBUR 0.3 09/03/2012   Assessment & Plan:   Problem List Items Addressed This Visit    Otitis media - Primary    New problem. Treating with Amoxicillin.      Relevant Medications   amoxicillin (AMOXIL) 500 MG capsule     Meds ordered this encounter  Medications  . amoxicillin (AMOXIL) 500 MG capsule    Sig: Take 1 capsule (500 mg total) by mouth 2 (two) times daily.    Dispense:  14 capsule    Refill:  0   Follow-up: PRN  Trent

## 2015-07-26 ENCOUNTER — Other Ambulatory Visit: Payer: Self-pay | Admitting: *Deleted

## 2015-07-26 DIAGNOSIS — K209 Esophagitis, unspecified without bleeding: Secondary | ICD-10-CM

## 2015-07-26 MED ORDER — PANTOPRAZOLE SODIUM 40 MG PO TBEC: 40.0000 mg | DELAYED_RELEASE_TABLET | Freq: Every day | ORAL | Status: DC

## 2015-07-29 ENCOUNTER — Ambulatory Visit: Payer: Medicare Other | Admitting: Internal Medicine

## 2015-07-29 DIAGNOSIS — Z0289 Encounter for other administrative examinations: Secondary | ICD-10-CM

## 2015-08-09 ENCOUNTER — Telehealth: Payer: Self-pay | Admitting: Internal Medicine

## 2015-08-09 NOTE — Telephone Encounter (Signed)
If she will not listen to her daughter,  We cannot force her to come in except when she is due for refills on her controlled substances.  We will withhold all refills until she is seen

## 2015-08-09 NOTE — Telephone Encounter (Signed)
Spoke extensively with daughter, [place] [phone] about Mom's increasing paranoia and agitation. She would like it if next time her mom comes in you could get a feel for what's going on with her and how the family should proceed. Pt has 2 daughters and a sister who are very involved in her life.

## 2015-08-09 NOTE — Telephone Encounter (Signed)
Pt daughter called concerned about her mom regarding symptoms of memory loss,impulsiveness,agitated,paranoid that people are talking about her,driving with excessive speed. Daughter wants to get her to come in but she states it's hard to get her to come in she wont come in. Daughter does not know what to do with how to get her to come in. Daughter also states her house is very cluttered and she will not let anyone in her home. Pt cannot be reasoned with at all on anything.   Daughter needs help on what to do.  Call daughter @ [phone]. Thank you!

## 2015-08-10 ENCOUNTER — Encounter: Payer: Self-pay | Admitting: Internal Medicine

## 2015-08-29 ENCOUNTER — Encounter: Payer: Self-pay | Admitting: Internal Medicine

## 2015-08-29 ENCOUNTER — Ambulatory Visit (INDEPENDENT_AMBULATORY_CARE_PROVIDER_SITE_OTHER): Payer: Medicare Other | Admitting: Internal Medicine

## 2015-08-29 ENCOUNTER — Other Ambulatory Visit: Payer: Self-pay | Admitting: Internal Medicine

## 2015-08-29 VITALS — BP 132/94 | HR 107 | Temp 98.6°F | Resp 12 | Ht 63.0 in | Wt 122.0 lb

## 2015-08-29 DIAGNOSIS — R195 Other fecal abnormalities: Secondary | ICD-10-CM

## 2015-08-29 DIAGNOSIS — R634 Abnormal weight loss: Secondary | ICD-10-CM | POA: Diagnosis not present

## 2015-08-29 DIAGNOSIS — S32519K Fracture of superior rim of unspecified pubis, subsequent encounter for fracture with nonunion: Secondary | ICD-10-CM

## 2015-08-29 DIAGNOSIS — Z79899 Other long term (current) drug therapy: Secondary | ICD-10-CM

## 2015-08-29 DIAGNOSIS — J438 Other emphysema: Secondary | ICD-10-CM

## 2015-08-29 DIAGNOSIS — E559 Vitamin D deficiency, unspecified: Secondary | ICD-10-CM | POA: Diagnosis not present

## 2015-08-29 DIAGNOSIS — F900 Attention-deficit hyperactivity disorder, predominantly inattentive type: Secondary | ICD-10-CM | POA: Diagnosis not present

## 2015-08-29 DIAGNOSIS — Z79891 Long term (current) use of opiate analgesic: Secondary | ICD-10-CM | POA: Diagnosis not present

## 2015-08-29 DIAGNOSIS — F909 Attention-deficit hyperactivity disorder, unspecified type: Secondary | ICD-10-CM

## 2015-08-29 MED ORDER — AMLODIPINE BESYLATE 5 MG PO TABS: 5.0000 mg | ORAL_TABLET | Freq: Every day | ORAL | Status: DC

## 2015-08-29 MED ORDER — HYDROCODONE-ACETAMINOPHEN 5-325 MG PO TABS: 1.0000 | ORAL_TABLET | Freq: Four times a day (QID) | ORAL | Status: DC | PRN

## 2015-08-29 MED ORDER — AMPHETAMINE-DEXTROAMPHETAMINE 15 MG PO TABS: 15.0000 mg | ORAL_TABLET | Freq: Two times a day (BID) | ORAL | Status: DC

## 2015-08-29 NOTE — Progress Notes (Signed)
Pre-visit discussion using our clinic review tool. No additional management support is needed unless otherwise documented below in the visit note.  

## 2015-08-29 NOTE — Patient Instructions (Addendum)
I am resuming your Adderall at a slightly lower dose because of my concerns that it may be causing increased impulsivty and agitation  I am increasing your amlodipine to 5 mg daily    Please return in a month
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
## 2015-08-30 DIAGNOSIS — J449 Chronic obstructive pulmonary disease, unspecified: Secondary | ICD-10-CM | POA: Insufficient documentation

## 2015-08-30 LAB — COMPREHENSIVE METABOLIC PANEL
ALBUMIN: 4.3 g/dL (ref 3.5–5.2)
ALK PHOS: 87 U/L (ref 39–117)
ALT: 11 U/L (ref 0–35)
AST: 19 U/L (ref 0–37)
BUN: 14 mg/dL (ref 6–23)
CHLORIDE: 101 meq/L (ref 96–112)
CO2: 28 mEq/L (ref 19–32)
Calcium: 10.3 mg/dL (ref 8.4–10.5)
Creatinine, Ser: 0.88 mg/dL (ref 0.40–1.20)
GFR: 67.5 mL/min (ref >60.00)
GLUCOSE: 93 mg/dL (ref 70–99)
POTASSIUM: 4.7 meq/L (ref 3.5–5.1)
Sodium: 139 mEq/L (ref 135–145)
TOTAL PROTEIN: 7.6 g/dL (ref 6.0–8.3)
Total Bilirubin: 0.2 mg/dL (ref 0.2–1.2)

## 2015-08-30 LAB — CBC WITH DIFFERENTIAL/PLATELET
BASOS PCT: 0.9 % (ref 0.0–3.0)
Basophils Absolute: 0.1 10*3/uL (ref 0.0–0.1)
EOS PCT: 5.9 % — AB (ref 0.0–5.0)
Eosinophils Absolute: 0.6 10*3/uL (ref 0.0–0.7)
HCT: 39.8 % (ref 36.0–46.0)
Hemoglobin: 13.2 g/dL (ref 12.0–15.0)
LYMPHS ABS: 1.8 10*3/uL (ref 0.7–4.0)
Lymphocytes Relative: 18.2 % (ref 12.0–46.0)
MCHC: 33.2 g/dL (ref 30.0–36.0)
MCV: 89.1 fl (ref 78.0–100.0)
MONOS PCT: 6.2 % (ref 3.0–12.0)
Monocytes Absolute: 0.6 10*3/uL (ref 0.1–1.0)
NEUTROS ABS: 6.9 10*3/uL (ref 1.4–7.7)
NEUTROS PCT: 68.8 % (ref 43.0–77.0)
Platelets: 318 10*3/uL (ref 150.0–400.0)
RBC: 4.47 Mil/uL (ref 3.87–5.11)
RDW: 14.1 % (ref 11.5–15.5)
WBC: 10 10*3/uL (ref 4.0–10.5)

## 2015-08-30 LAB — VITAMIN D 25 HYDROXY (VIT D DEFICIENCY, FRACTURES): VITD: 35.53 ng/mL (ref 30.00–100.00)

## 2015-08-30 LAB — T4 AND TSH
T4, Total: 6.2 ug/dL (ref 4.5–12.0)
TSH: 0.704 u[IU]/mL (ref 0.450–4.500)

## 2015-08-30 NOTE — Assessment & Plan Note (Signed)
Trial of vyvanse noted no improvement and given her daughter's reports of erratic behavior,  Discussed need for reduction in dosing to 15 mg bid and follow up in one month . May need referral for testing

## 2015-08-30 NOTE — Assessment & Plan Note (Signed)
Hyperplastic polyps retrieved from her sigmoid colon Dec 2016.  (pyrtle). Follow up 5-10 years.

## 2015-08-30 NOTE — Assessment & Plan Note (Signed)
Suggested by Nov 2016 chest x ray done to evaluate persistent cough, history of tobacco abuse.
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
## 2015-08-31 ENCOUNTER — Telehealth: Payer: Self-pay

## 2015-08-31 NOTE — Telephone Encounter (Signed)
Error

## 2015-09-04 ENCOUNTER — Encounter: Payer: Self-pay | Admitting: Internal Medicine

## 2015-09-06 LAB — PAIN MGMT, PROFILE 1 W/O CONF, U
AMPHETAMINES: POSITIVE ng/mL — AB (ref <500)
BARBITURATES: NEGATIVE ng/mL (ref <300)
BENZODIAZEPINES: POSITIVE ng/mL — AB (ref <100)
COCAINE METABOLITE: NEGATIVE ng/mL (ref <150)
CREATININE: 75.8 mg/dL (ref >=20.0)
METHADONE METABOLITE: NEGATIVE ng/mL (ref <100)
Marijuana Metabolite: NEGATIVE ng/mL (ref <20)
OPIATES: POSITIVE ng/mL — AB (ref <100)
Oxidant: NEGATIVE ug/mL (ref <200)
Oxycodone: NEGATIVE ng/mL (ref <100)
Phencyclidine: NEGATIVE ng/mL (ref <25)
pH: 6.08 (ref 4.5–9.0)

## 2015-09-08 ENCOUNTER — Ambulatory Visit: Payer: Medicare Other | Admitting: Internal Medicine

## 2015-09-26 ENCOUNTER — Other Ambulatory Visit: Payer: Self-pay | Admitting: Internal Medicine

## 2015-09-26 ENCOUNTER — Ambulatory Visit (INDEPENDENT_AMBULATORY_CARE_PROVIDER_SITE_OTHER): Payer: Medicare Other | Admitting: Internal Medicine

## 2015-09-26 ENCOUNTER — Encounter: Payer: Self-pay | Admitting: Internal Medicine

## 2015-09-26 VITALS — BP 126/92 | HR 87 | Temp 98.4°F | Resp 12 | Ht 63.0 in | Wt 120.5 lb

## 2015-09-26 DIAGNOSIS — F413 Other mixed anxiety disorders: Secondary | ICD-10-CM | POA: Diagnosis not present

## 2015-09-26 DIAGNOSIS — Z79899 Other long term (current) drug therapy: Secondary | ICD-10-CM | POA: Diagnosis not present

## 2015-09-26 DIAGNOSIS — I10 Essential (primary) hypertension: Secondary | ICD-10-CM

## 2015-09-26 MED ORDER — HYDROCODONE-ACETAMINOPHEN 5-325 MG PO TABS: 1.0000 | ORAL_TABLET | Freq: Four times a day (QID) | ORAL | Status: DC | PRN

## 2015-09-26 MED ORDER — BUPROPION HCL ER (XL) 150 MG PO TB24: 150.0000 mg | ORAL_TABLET | Freq: Every day | ORAL | Status: DC
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
## 2015-09-26 NOTE — Progress Notes (Signed)
Pre-visit discussion using our clinic review tool. No additional management support is needed unless otherwise documented below in the visit note.  

## 2015-09-26 NOTE — Patient Instructions (Signed)
I have refilled your Vicodin for 3 more months  I am suggesting a trial of wellbutrin for your concentration deficits instead of adderall   For your insomnia,  If you cannot sleep without the alprazolam,  We can try trazodone or Elavil,

## 2015-09-27 ENCOUNTER — Encounter: Payer: Self-pay | Admitting: Internal Medicine

## 2015-09-28 DIAGNOSIS — Z79899 Other long term (current) drug therapy: Secondary | ICD-10-CM | POA: Insufficient documentation

## 2015-09-28 NOTE — Assessment & Plan Note (Signed)
Well controlled on current regimen by home measurements.  Renal function stable, no changes today.  Lab Results  Component Value Date   CREATININE 0.88 08/29/2015   Lab Results  Component Value Date   NA 139 08/29/2015   K 4.7 08/29/2015   CL 101 08/29/2015   CO2 28 08/29/2015

## 2015-09-28 NOTE — Assessment & Plan Note (Addendum)
Did not tolerate lexapro.  Stopping adderall. Addressed use of alprazolam and suggested trial of trazodone or Elavil if still needed

## 2015-09-28 NOTE — Assessment & Plan Note (Signed)
Discussed eliminating adderall and starting wellbutrin instead for concentration deficits .  This may eliminate the need for alprazolam for insomnia as well. Warned patient aht chronic vicodin use may need to be discontinued in the near future as well due to changing legislature.

## 2015-10-10 ENCOUNTER — Other Ambulatory Visit: Payer: Self-pay | Admitting: Internal Medicine

## 2015-10-10 NOTE — Telephone Encounter (Signed)
Received refill request electronically Last refill 06/23/15 #30/3 Last office visit 09/26/15 Okay to refill?

## 2015-10-17 ENCOUNTER — Telehealth: Payer: Self-pay

## 2015-10-17 NOTE — Telephone Encounter (Signed)
Received refill request from [place] for Xanax. Spoke to patient she states she has been alternating Xanax and Trazodone every 2 nights or so. She states if she needs to stop and not do that to let her know or what you think. Please review. Thank you-aa

## 2015-10-18 MED ORDER — ALPRAZOLAM 0.5 MG PO TABS: 0.5000 mg | ORAL_TABLET | Freq: Every evening | ORAL | 3 refills | Status: DC | PRN

## 2015-10-18 NOTE — Telephone Encounter (Signed)
Patient advised and RX called in to [place]

## 2015-10-18 NOTE — Telephone Encounter (Signed)
I will refill at #15/month to encourage her to use the trazodone instead.  Please tell her to try increasing The trazodone dose to 100 mg if not effective at 50 mg

## 2015-11-04 ENCOUNTER — Other Ambulatory Visit: Payer: Self-pay | Admitting: Internal Medicine

## 2015-11-15 ENCOUNTER — Other Ambulatory Visit: Payer: Self-pay | Admitting: Internal Medicine

## 2015-11-15 DIAGNOSIS — K209 Esophagitis, unspecified without bleeding: Secondary | ICD-10-CM

## 2015-12-06 ENCOUNTER — Other Ambulatory Visit: Payer: Self-pay | Admitting: Internal Medicine

## 2015-12-06 NOTE — Telephone Encounter (Signed)
Refilled 10/10/15. Pt last seen 08/29/15 and 09/26/15. Please advise?

## 2015-12-14 ENCOUNTER — Other Ambulatory Visit: Payer: Self-pay

## 2015-12-14 MED ORDER — ALPRAZOLAM 0.5 MG PO TABS: 0.5000 mg | ORAL_TABLET | Freq: Every evening | ORAL | 3 refills | Status: DC | PRN

## 2015-12-16 NOTE — Telephone Encounter (Signed)
Rx faxed

## 2015-12-20 ENCOUNTER — Other Ambulatory Visit: Payer: Self-pay | Admitting: Internal Medicine

## 2015-12-28 ENCOUNTER — Ambulatory Visit (INDEPENDENT_AMBULATORY_CARE_PROVIDER_SITE_OTHER): Payer: Medicare Other | Admitting: Internal Medicine

## 2015-12-28 DIAGNOSIS — M150 Primary generalized (osteo)arthritis: Secondary | ICD-10-CM | POA: Diagnosis not present

## 2015-12-28 DIAGNOSIS — J438 Other emphysema: Secondary | ICD-10-CM | POA: Diagnosis not present

## 2015-12-28 DIAGNOSIS — F413 Other mixed anxiety disorders: Secondary | ICD-10-CM | POA: Diagnosis not present

## 2015-12-28 DIAGNOSIS — M159 Polyosteoarthritis, unspecified: Secondary | ICD-10-CM

## 2015-12-28 MED ORDER — AMPHETAMINE-DEXTROAMPHETAMINE 20 MG PO TABS: 20.0000 mg | ORAL_TABLET | Freq: Two times a day (BID) | ORAL | 0 refills | Status: DC

## 2015-12-28 MED ORDER — PREDNISONE 10 MG PO TABS: ORAL_TABLET | ORAL | 0 refills | Status: DC

## 2015-12-28 MED ORDER — DOXYCYCLINE HYCLATE 100 MG PO TABS: 100.0000 mg | ORAL_TABLET | Freq: Two times a day (BID) | ORAL | 0 refills | Status: DC

## 2015-12-28 MED ORDER — ESZOPICLONE 3 MG PO TABS: 3.0000 mg | ORAL_TABLET | Freq: Every day | ORAL | 5 refills | Status: DC

## 2015-12-28 MED ORDER — ALBUTEROL SULFATE (2.5 MG/3ML) 0.083% IN NEBU: 2.5000 mg | INHALATION_SOLUTION | Freq: Four times a day (QID) | RESPIRATORY_TRACT | 1 refills | Status: DC | PRN

## 2015-12-28 MED ORDER — TRAMADOL HCL 50 MG PO TABS: 50.0000 mg | ORAL_TABLET | Freq: Four times a day (QID) | ORAL | 2 refills | Status: DC | PRN

## 2015-12-28 MED ORDER — FLUCONAZOLE 150 MG PO TABS: 150.0000 mg | ORAL_TABLET | Freq: Every day | ORAL | 1 refills | Status: DC

## 2015-12-28 NOTE — Progress Notes (Signed)
Subjective:  Patient ID: Sonya Davis, female    DOB: May 21, 1945  Age: 70 y.o. MRN: [id number]  CC: Diagnoses of Other emphysema (Sonya Davis), Other mixed anxiety disorders, and Primary osteoarthritis involving multiple joints were pertinent to this visit.  HPI Sonya Davis presents for follow up on polypharmacy secondary to ADD,  GAD with insomnia and chronic pain secondary to history of traumatic fractures with resultant OA managed with Vicodin  Depressed,  Anxious,  Not sleeping,   Daughter Sonya Davis finishing PA program. Still having conflict with daughters,  Has had no contact with them in a month.  ADD:  In July adderall was stopped and trial of wellbutrin was recommended.  She has not done well on the wellbutrin. Can't concentrate  can't finish tasks.    GAD:  Anxiety increased.  Advised to use trazodone 50 mg for insomnia,  Has bee takig it at 10:30 ,  instead of alprazolam   She  has not tolerated the change ,  Not sleeping well,   Feels tired all the time,  Falling sleep at 12:30 and Wakes up  At 2:30 and can't go back to sleep . OA:  Pain is constant.  Never below an 8. Sonya Davis to go without the vicodin and uses tylenol and tramadol.    Productive Cough secondary to COPD  has returned  .  Resolved with use of albuterol nebulizer    Outpatient Medications Prior to Visit  Medication Sig Dispense Refill  . amLODipine (NORVASC) 5 MG tablet Take 1 tablet (5 mg total) by mouth daily. 30 tablet 5  . benzonatate (TESSALON) 200 MG capsule Take 1 capsule (200 mg total) by mouth 3 (three) times daily as needed. 60 capsule 3  . calcium carbonate (OS-CAL) 600 MG TABS Take 600 mg by mouth 2 (two) times daily with a meal.      . cetirizine (ZYRTEC) 10 MG tablet Take 10 mg by mouth daily.    . cholecalciferol (VITAMIN D) 1000 UNITS tablet Take 1,000 Units by mouth daily.      Sonya Davis Calcium (STOOL SOFTENER PO) Take by mouth.      . fexofenadine (ALLEGRA) 180 MG tablet Take 1 tablet (180 mg  total) by mouth daily. 90 tablet 1  . Fluticasone-Salmeterol (ADVAIR DISKUS) 250-50 MCG/DOSE AEPB Inhale 1 puff into the lungs 2 (two) times daily. 1 each 3  . furosemide (LASIX) 20 MG tablet Take 1 tablet (20 mg total) by mouth daily as needed. 30 tablet 3  . pantoprazole (PROTONIX) 40 MG tablet Take 1 tablet (40 mg total) by mouth daily. 30 tablet 4  . PROAIR HFA 108 (90 Base) MCG/ACT inhaler TAKE TWO PUFFS EVERY 6 HOURS AS NEEDED FOR WHEEZE OR SHORTNESS OF BREATH. 8.5 g 0  . ALPRAZolam (XANAX) 0.5 MG tablet Take 1 tablet (0.5 mg total) by mouth at bedtime as needed for anxiety (every other night). MAXIMUM MONTHLY AMOUNT 15 15 tablet 3  . buPROPion (WELLBUTRIN XL) 150 MG 24 hr tablet Take 1 tablet (150 mg total) by mouth daily. 30 tablet 5  . HYDROcodone-acetaminophen (NORCO/VICODIN) 5-325 MG tablet Take 1 tablet by mouth every 6 (six) hours as needed. Maximum dose twice daily 60 tablet 0  . HYDROcodone-acetaminophen (NORCO/VICODIN) 5-325 MG tablet Take 1 tablet by mouth every 6 (six) hours as needed. Maximum dose twice daily 60 tablet 0  . HYDROcodone-acetaminophen (NORCO/VICODIN) 5-325 MG tablet Take 1 tablet by mouth every 6 (six) hours as needed. Maximum dose twice daily  60 tablet 0  . mometasone (ELOCON) 0.1 % cream Apply 1 application topically daily. To ear canal (Patient not taking: Reported on 09/26/2015) 45 g 0  . silver sulfADIAZINE (SILVADENE) 1 % cream Apply 1 application topically daily. 50 g 0  . silver sulfADIAZINE (SILVADENE) 1 % cream Apply 1 application topically daily. 50 g 0  . traZODone (DESYREL) 50 MG tablet Take 0.5-1 tablets (25-50 mg total) by mouth at bedtime as needed forsleep. 30 tablet 3   No facility-administered medications prior to visit.     Review of Systems;  Patient denies headache, fevers, malaise, unintentional weight loss, skin rash, eye pain, sinus congestion and sinus pain, sore throat, dysphagia,  hemoptysis , cough, dyspnea, wheezing, chest pain,  palpitations, orthopnea, edema, abdominal pain, nausea, melena, diarrhea, constipation, flank pain, dysuria, hematuria, urinary  Frequency, nocturia, numbness, tingling, seizures,  Focal weakness, Loss of consciousness,  Tremor, insomnia, depression, anxiety, and suicidal ideation.      Objective:  BP (!) 142/80   Pulse 93   Temp (!) 95 F (35 C) (Oral)   Resp 12   Ht 5\' 3"  (1.6 m)   Wt 122 lb 8 oz (55.6 kg)   SpO2 95%   BMI 21.70 kg/m   BP Readings from Last 3 Encounters:  12/28/15 (!) 142/80  09/26/15 (!) 126/92  08/29/15 (!) 132/94    Wt Readings from Last 3 Encounters:  12/28/15 122 lb 8 oz (55.6 kg)  09/26/15 120 lb 8 oz (54.7 kg)  08/29/15 122 lb (55.3 kg)    General appearance: alert, cooperative and appears stated age. Not in distress.  Ears: normal TM's and external ear canals both ears Throat: lips, mucosa, and tongue normal; teeth and gums normal Neck: no adenopathy, no carotid bruit, supple, symmetrical, trachea midline and thyroid not enlarged, symmetric, no tenderness/mass/nodules Back: symmetric, no curvature. ROM normal. No CVA tenderness. Lungs: scattered wheezes bilaterally,  good air movement.  Heart: regular rate and rhythm, S1, S2 normal, no murmur, click, rub or gallop Abdomen: soft, non-tender; bowel sounds normal; no masses,  no organomegaly Pulses: 2+ and symmetric Skin: Skin color, texture, turgor normal. No rashes or lesions Lymph nodes: Cervical, supraclavicular, and axillary nodes normal. Psych: affect normal, makes good eye contact. Some fidgeting,  Smiles easily.  Denies suicidal thoughts    No results found for: HGBA1C  Lab Results  Component Value Date   CREATININE 0.88 08/29/2015   CREATININE 0.78 03/28/2015   CREATININE 0.92 09/17/2014    Lab Results  Component Value Date   WBC 10.0 08/29/2015   HGB 13.2 08/29/2015   HCT 39.8 08/29/2015   PLT 318.0 08/29/2015   GLUCOSE 93 08/29/2015   CHOL 238 (H) 09/17/2014   TRIG 157.0  (H) 09/17/2014   HDL 69.40 09/17/2014   LDLDIRECT 141.2 01/10/2012   LDLCALC 137 (H) 09/17/2014   ALT 11 08/29/2015   AST 19 08/29/2015   NA 139 08/29/2015   K 4.7 08/29/2015   CL 101 08/29/2015   CREATININE 0.88 08/29/2015   BUN 14 08/29/2015   CO2 28 08/29/2015   TSH 0.704 08/29/2015   MICROALBUR 0.3 09/03/2012      Assessment & Plan:   Problem List Items Addressed This Visit    Osteoarthritis    With chronic daily pain secondary to remote fractures.  Patient has not had an escalation of use of narcotics and is reminded not to share medication with others or to combine with alcohol or other sedating medications.  Patient is providing urine sample upon request per narcotics contract, Trial of tramadol was agreed to today.  Refill history confirmed via [place] Controlled Substance databas, accessed by me today..       Relevant Medications   traMADol (ULTRAM) 50 MG tablet   predniSONE (DELTASONE) 10 MG tablet   Anxiety disorder    Manifested primary as insomnia.  Aggravated by suspension of alprazolam and trial of wellbutrin for ADD and trazodone for insomnia.  Resume adderall ,  Stop wellbutrin and trial of Lunesta.       COPD (chronic obstructive pulmonary disease) ([place])    Advised to resume Adviar as maintenance therapy . Adding doxycycline and prednisone taper for COPD exacerbation       Relevant Medications   predniSONE (DELTASONE) 10 MG tablet   albuterol (PROVENTIL) (2.5 MG/3ML) 0.083% nebulizer solution    Other Visit Diagnoses   None.     I have discontinued Ms. Kirchberg mometasone, silver sulfADIAZINE, silver sulfADIAZINE, buPROPion, HYDROcodone-acetaminophen, HYDROcodone-acetaminophen, HYDROcodone-acetaminophen, traZODone, and ALPRAZolam. I am also having her start on traMADol, Eszopiclone, predniSONE, doxycycline, fluconazole, and albuterol. Additionally, I am having her maintain her calcium carbonate, cholecalciferol, Docusate Calcium (STOOL SOFTENER PO),  furosemide, cetirizine, fexofenadine, Fluticasone-Salmeterol, amLODipine, benzonatate, pantoprazole, PROAIR HFA, amphetamine-dextroamphetamine, amphetamine-dextroamphetamine, and amphetamine-dextroamphetamine.  Meds ordered this encounter  Medications  . traMADol (ULTRAM) 50 MG tablet    Sig: Take 1 tablet (50 mg total) by mouth every 6 (six) hours as needed.    Dispense:  120 tablet    Refill:  2  . Eszopiclone 3 MG TABS    Sig: Take 1 tablet (3 mg total) by mouth at bedtime. Take immediately before bedtime    Dispense:  30 tablet    Refill:  5  . DISCONTD: amphetamine-dextroamphetamine (ADDERALL) 20 MG tablet    Sig: Take 1 tablet (20 mg total) by mouth 2 (two) times daily.    Dispense:  60 tablet    Refill:  0  . amphetamine-dextroamphetamine (ADDERALL) 20 MG tablet    Sig: Take 1 tablet (20 mg total) by mouth 2 (two) times daily.    Dispense:  60 tablet    Refill:  0    Refill on or after February 27 2016  . amphetamine-dextroamphetamine (ADDERALL) 20 MG tablet    Sig: Take 1 tablet (20 mg total) by mouth 2 (two) times daily.    Dispense:  60 tablet    Refill:  0    My refill on or after Jan 28 2016  . predniSONE (DELTASONE) 10 MG tablet    Sig: 6 tablets on Day 1 , then reduce by 1 tablet daily until gone    Dispense:  21 tablet    Refill:  0  . doxycycline (VIBRA-TABS) 100 MG tablet    Sig: Take 1 tablet (100 mg total) by mouth 2 (two) times daily.    Dispense:  14 tablet    Refill:  0  . fluconazole (DIFLUCAN) 150 MG tablet    Sig: Take 1 tablet (150 mg total) by mouth daily.    Dispense:  2 tablet    Refill:  1  . albuterol (PROVENTIL) (2.5 MG/3ML) 0.083% nebulizer solution    Sig: Take 3 mLs (2.5 mg total) by nebulization every 6 (six) hours as needed for wheezing or shortness of breath.    Dispense:  150 mL    Refill:  1  . amphetamine-dextroamphetamine (ADDERALL) 20 MG tablet    Sig: Take 1 tablet (20 mg total)  by mouth 2 (two) times daily.    Dispense:  60  tablet    Refill:  0  A total of 40 minutes was spent with patient more than half of which was spent in counseling patient on the above mentioned issues  and coordination of care.  Medications Discontinued During This Encounter  Medication Reason  . amphetamine-dextroamphetamine (ADDERALL) 20 MG tablet Reorder  . buPROPion (WELLBUTRIN XL) 150 MG 24 hr tablet   . HYDROcodone-acetaminophen (NORCO/VICODIN) 5-325 MG tablet   . HYDROcodone-acetaminophen (NORCO/VICODIN) 5-325 MG tablet   . HYDROcodone-acetaminophen (NORCO/VICODIN) 5-325 MG tablet   . ALPRAZolam (XANAX) 0.5 MG tablet   . traZODone (DESYREL) 50 MG tablet   . silver sulfADIAZINE (SILVADENE) 1 % cream   . mometasone (ELOCON) 0.1 % cream   . silver sulfADIAZINE (SILVADENE) 1 % cream     Follow-up: Return in about 3 months (around 03/29/2016).   Crecencio Mc, MD

## 2015-12-28 NOTE — Progress Notes (Signed)
Pre-visit discussion using our clinic review tool. No additional management support is needed unless otherwise documented below in the visit note.  

## 2015-12-28 NOTE — Patient Instructions (Signed)
WE ARE RESUMING ADDERALL FOR YOUR ADHD  Trial of tramadol 50 mg every 6 hours for pain control instead of daily vicodin  Ok to add tylenol 500 mg every 6 hours  To this medication  Trial of Lunesta for insomnia  3 mg at bedtime    7 days of Doxycycline and prednisone 6 day  taper for COPD EXACERBATION   Check with your formulary   Benzonatate 100 mg  Cheratussin (codeine)

## 2015-12-30 NOTE — Assessment & Plan Note (Signed)
With chronic daily pain secondary to remote fractures.  Patient has not had an escalation of use of narcotics and is reminded not to share medication with others or to combine with alcohol or other sedating medications.  Patient is providing urine sample upon request per narcotics contract, Trial of tramadol was agreed to today.  Refill history confirmed via [place] Controlled Substance databas, accessed by me today.[name] Kitchen

## 2015-12-30 NOTE — Assessment & Plan Note (Addendum)
Advised to resume Adviar as maintenance therapy . Adding doxycycline and prednisone taper for COPD exacerbation

## 2015-12-30 NOTE — Assessment & Plan Note (Signed)
Manifested primary as insomnia.  Aggravated by suspension of alprazolam and trial of wellbutrin for ADD and trazodone for insomnia.  Resume adderall ,  Stop wellbutrin and trial of Lunesta.

## 2015-12-31 DIAGNOSIS — S92901A Unspecified fracture of right foot, initial encounter for closed fracture: Secondary | ICD-10-CM | POA: Diagnosis not present

## 2016-01-02 DIAGNOSIS — M25571 Pain in right ankle and joints of right foot: Secondary | ICD-10-CM | POA: Diagnosis not present

## 2016-01-04 ENCOUNTER — Other Ambulatory Visit: Payer: Self-pay | Admitting: Internal Medicine

## 2016-01-13 DIAGNOSIS — M79671 Pain in right foot: Secondary | ICD-10-CM | POA: Diagnosis not present

## 2016-01-13 DIAGNOSIS — S92354A Nondisplaced fracture of fifth metatarsal bone, right foot, initial encounter for closed fracture: Secondary | ICD-10-CM | POA: Diagnosis not present

## 2016-02-03 ENCOUNTER — Other Ambulatory Visit: Payer: Self-pay | Admitting: Internal Medicine

## 2016-02-10 DIAGNOSIS — S92354D Nondisplaced fracture of fifth metatarsal bone, right foot, subsequent encounter for fracture with routine healing: Secondary | ICD-10-CM | POA: Diagnosis not present

## 2016-02-13 ENCOUNTER — Other Ambulatory Visit: Payer: Self-pay | Admitting: Internal Medicine

## 2016-02-28 ENCOUNTER — Telehealth: Payer: Self-pay

## 2016-02-28 NOTE — Telephone Encounter (Signed)
She was given printed rx's for nov 18 and dec 18 at her last visit . I cannot refill until jan 18th please have Kathy's input

## 2016-02-28 NOTE — Telephone Encounter (Signed)
Rx refill request Adderall 20 mg  Last Refill 12/28/2015 Last OV 12/28/2015 Please advise

## 2016-02-28 NOTE — Telephone Encounter (Signed)
Called patient and verified patient has script for December.

## 2016-03-14 ENCOUNTER — Ambulatory Visit (INDEPENDENT_AMBULATORY_CARE_PROVIDER_SITE_OTHER): Payer: Medicare Other | Admitting: Family

## 2016-03-14 ENCOUNTER — Encounter: Payer: Self-pay | Admitting: Family

## 2016-03-14 VITALS — BP 140/90 | HR 121 | Temp 98.2°F | Resp 14 | Wt 119.8 lb

## 2016-03-14 DIAGNOSIS — H66002 Acute suppurative otitis media without spontaneous rupture of ear drum, left ear: Secondary | ICD-10-CM

## 2016-03-14 MED ORDER — AMOXICILLIN 500 MG PO TABS: 500.0000 mg | ORAL_TABLET | Freq: Two times a day (BID) | ORAL | 0 refills | Status: AC

## 2016-03-14 NOTE — Progress Notes (Signed)
Subjective:    Patient ID: Sonya Davis, female    DOB: 06/07/1945, 71 y.o.   MRN: [id number]  CC: Sonya Davis is a 71 y.o. female who presents today for an acute visit.    HPI: CC: cough, congestion, left ear pain x 10 days, unchanged.  Endorses N, V 2 days ago which has resolved. Endorses hearing loss in left ear, however after blowing ear, felt 'cracking in ear.'  Tried mucinex, benadryl, allegra, flonase, sudafed without relief. Took 2 10mg  sudafed today.   Using nebulizer, last  One yesterday.                                                                                                                               H/o COPD, sees Dr Shawna Orleans.   'has white coat syndrome'. Reviewed prior vitals and BP at baseline. HR had been 107 in past.  Denies exertional chest pain or pressure, numbness or tingling radiating to left arm or jaw, palpitations, dizziness, frequent headaches, changes in vision, or shortness of breath.      HISTORY:  Past Medical History:  Diagnosis Date  . ADD (attention deficit disorder)   . Allergy    seasonal  . Cataract    surgery to remove them  . Endometriosis of ovary    prior GYN Sonya Davis  . 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 Fracture closed, pubis ([place]) 1981  . Fracture of bone of left shoulder 1981  . Fracture of maxilla ([place]) 1981  . Gastritis   . GERD (gastroesophageal reflux disease)   . History of ovarian cyst   . History of pericarditis 1988   occurred after flu shot, with pneumonia  . Osteoporosis   . Ovarian cyst    right, endometriosis  . Pericarditis    does not receive flu vaccine becuase of this  . Rheumatoid arthritis(714.0) 2000   per patient by labs   Past Surgical History:  Procedure Laterality Date  . ABDOMINAL HYSTERECTOMY     secondary to endometriosis  . ANTERIOR CRUCIATE LIGAMENT REPAIR  age 53   left  . APPENDECTOMY     at age 65  .  ARTHROSCOPIC REPAIR ACL  1999   left knee  . CARPAL TUNNEL RELEASE     bilateral surgeries  . CATARACT EXTRACTION  Mar 2013   right eye, [place]  . CYSTECTOMY  1963  . EYE SURGERY  2005   left cataract   . septoplasty  Dec 2006   [name],   . SEPTOPLASTY  2006   Family History  Problem Relation Age of Onset  . Cancer Mother     lung  . Heart disease Father   . Crohn's disease Sister   . Breast cancer Cousin   . Colon cancer Neg Hx     Allergies: Influenza vaccines Current Outpatient Prescriptions on File  Prior to Visit  Medication Sig Dispense Refill  . ADVAIR DISKUS 250-50 MCG/DOSE AEPB INHALE ONE PUFF INTO LUNGS TWICE DAILY 60 each 1  . albuterol (PROVENTIL) (2.5 MG/3ML) 0.083% nebulizer solution Take 3 mLs (2.5 mg total) by nebulization every 6 (six) hours as needed for wheezing or shortness of breath. 75 mL 0  . amLODipine (NORVASC) 5 MG tablet Take 1 tablet (5 mg total) by mouth daily. 30 tablet 5  . amphetamine-dextroamphetamine (ADDERALL) 20 MG tablet Take 1 tablet (20 mg total) by mouth 2 (two) times daily. 60 tablet 0  . amphetamine-dextroamphetamine (ADDERALL) 20 MG tablet Take 1 tablet (20 mg total) by mouth 2 (two) times daily. 60 tablet 0  . amphetamine-dextroamphetamine (ADDERALL) 20 MG tablet Take 1 tablet (20 mg total) by mouth 2 (two) times daily. 60 tablet 0  . benzonatate (TESSALON) 200 MG capsule Take 1 capsule (200 mg total) by mouth 3 (three) times daily as needed. 60 capsule 3  . calcium carbonate (OS-CAL) 600 MG TABS Take 600 mg by mouth 2 (two) times daily with a meal.      . cetirizine (ZYRTEC) 10 MG tablet Take 10 mg by mouth daily.    . cholecalciferol (VITAMIN D) 1000 UNITS tablet Take 1,000 Units by mouth daily.      Sonya Davis Calcium (STOOL SOFTENER PO) Take by mouth.      . doxycycline (VIBRA-TABS) 100 MG tablet Take 1 tablet (100 mg total) by mouth 2 (two) times daily. 14 tablet 0  . Eszopiclone 3 MG TABS Take 1 tablet (3 mg total) by  mouth at bedtime. Take immediately before bedtime 30 tablet 5  . fexofenadine (ALLEGRA) 180 MG tablet Take 1 tablet (180 mg total) by mouth daily. 90 tablet 1  . fluconazole (DIFLUCAN) 150 MG tablet Take 1 tablet (150 mg total) by mouth daily. 2 tablet 1  . furosemide (LASIX) 20 MG tablet Take 1 tablet (20 mg total) by mouth daily as needed. 30 tablet 3  . pantoprazole (PROTONIX) 40 MG tablet Take 1 tablet (40 mg total) by mouth daily. 30 tablet 4  . predniSONE (DELTASONE) 10 MG tablet 6 tablets on Day 1 , then reduce by 1 tablet daily until gone 21 tablet 0  . PROAIR HFA 108 (90 Base) MCG/ACT inhaler TAKE TWO PUFFS EVERY 6 HOURS AS NEEDED FOR WHEEZE OR SHORTNESS OF BREATH. 8.5 g 0  . traMADol (ULTRAM) 50 MG tablet Take 1 tablet (50 mg total) by mouth every 6 (six) hours as needed. 120 tablet 2   No current facility-administered medications on file prior to visit.     Social History  Substance Use Topics  . Smoking status: Former Smoker    Types: Cigarettes    Quit date: 02/11/2001  . Smokeless tobacco: Never Used  . Alcohol use 3.0 oz/week    5 Standard drinks or equivalent per week    Review of Systems  Constitutional: Negative for chills and fever.  HENT: Positive for congestion and ear pain. Negative for sinus pain and sore throat.   Eyes: Negative for visual disturbance.  Respiratory: Positive for cough. Negative for shortness of breath and wheezing.   Cardiovascular: Negative for chest pain and palpitations.  Gastrointestinal: Negative for nausea and vomiting.  Neurological: Negative for headaches.      Objective:    BP 140/90   Pulse (!) 121   Temp 98.2 F (36.8 C) (Oral)   Resp 14   Wt 119 lb 12.8 oz (54.3 kg)  SpO2 97%   BMI 21.22 kg/m   BP Readings from Last 3 Encounters:  03/14/16 140/90  12/28/15 (!) 142/80  09/26/15 (!) 126/92    Physical Exam  Constitutional: She appears well-developed and well-nourished.  HENT:  Head: Normocephalic and atraumatic.    Right Ear: Hearing, tympanic membrane, external ear and ear canal normal. No drainage, swelling or tenderness. No foreign bodies. Tympanic membrane is not erythematous and not bulging. No middle ear effusion. No decreased hearing is noted.  Left Ear: External ear and ear canal normal. No drainage, swelling or tenderness. No foreign bodies. Tympanic membrane is erythematous and bulging. A middle ear effusion is present. Decreased hearing is noted.  Nose: Nose normal. No rhinorrhea. Right sinus exhibits no maxillary sinus tenderness and no frontal sinus tenderness. Left sinus exhibits no maxillary sinus tenderness and no frontal sinus tenderness.  Mouth/Throat: Uvula is midline, oropharynx is clear and moist and mucous membranes are normal. No oropharyngeal exudate, posterior oropharyngeal edema, posterior oropharyngeal erythema or tonsillar abscesses.  Eyes: Conjunctivae are normal.  Cardiovascular: Regular rhythm, normal heart sounds and normal pulses.   Pulmonary/Chest: Effort normal and breath sounds normal. She has no wheezes. She has no rhonchi. She has no rales.  Lymphadenopathy:       Head (right side): No submental, no submandibular, no tonsillar, no preauricular, no posterior auricular and no occipital adenopathy present.       Head (left side): No submental, no submandibular, no tonsillar, no preauricular, no posterior auricular and no occipital adenopathy present.    She has no cervical adenopathy.  Neurological: She is alert.  Skin: Skin is warm and dry.  Psychiatric: She has a normal mood and affect. Her speech is normal and behavior is normal. Thought content normal.  Vitals reviewed.      Assessment & Plan:   1. Acute suppurative otitis media of left ear without spontaneous rupture of tympanic membrane, recurrence not specified Afebrile. Sa02 97. NO chest pain, SOB. Suspect that elevated HR is related to two doses of sudafed earlier today, infection likely contributory as well.  Patient will call with HR when she gets home. Advised to STOP sudafed. Advised f/u with PCP for blood pressure at next appt in 2 weeks.   - amoxicillin (AMOXIL) 500 MG tablet; Take 1 tablet (500 mg total) by mouth 2 (two) times daily.  Dispense: 14 tablet; Refill: 0     I am having Ms. Edick maintain her calcium carbonate, cholecalciferol, Docusate Calcium (STOOL SOFTENER PO), furosemide, cetirizine, fexofenadine, benzonatate, pantoprazole, PROAIR HFA, traMADol, Eszopiclone, amphetamine-dextroamphetamine, amphetamine-dextroamphetamine, predniSONE, doxycycline, fluconazole, amphetamine-dextroamphetamine, ADVAIR DISKUS, albuterol, and amLODipine.   No orders of the defined types were placed in this encounter.   Return precautions given.   Risks, benefits, and alternatives of the medications and treatment plan prescribed today were discussed, and patient expressed understanding.   Education regarding symptom management and diagnosis given to patient on AVS.  Continue to follow with TULLO, Aris Everts, MD for routine health maintenance.   Sonya Davis and I agreed with plan.   Mable Paris, FNP

## 2016-03-14 NOTE — Patient Instructions (Addendum)
Stop sudafed; please call with HR from home after you have been off sudafed  mucinex is fine  Increase intake of clear fluids. Congestion is best treated by hydration, when mucus is wetter, it is thinner, less sticky, and easier to expel from the body, either through coughing up drainage, or by blowing your nose.   Get plenty of rest.   Use saline nasal drops and blow your nose frequently. Run a humidifier at night and elevate the head of the bed. Vicks Vapor rub will help with congestion and cough. Steam showers and sinus massage for congestion.   Use Acetaminophen or Ibuprofen as needed for fever or pain. Avoid second hand smoke. Even the smallest exposure will worsen symptoms.   Over the counter medications you can try include Delsym for cough, a decongestant for congestion, and Mucinex or Robitussin as an expectorant. Be sure to just get the plain Mucinex or Robitussin that just has one medication (Guaifenesen). We don't recommend the combination products. Note, be sure to drink two glasses of water with each dose of Mucinex as the medication will not work well without adequate hydration.   You can also try a teaspoon of honey to see if this will help reduce cough. Throat lozenges can sometimes be beneficial as well.    This illness will typically last 7 - 10 days.   Please follow up with our clinic if you develop a fever greater than 101 F, symptoms worsen, or do not resolve in the next week.   Otitis Media With Effusion Otitis media with effusion is the presence of fluid in the middle ear. This is a common problem in children, which often follows ear infections. It may be present for weeks or longer after the infection. Unlike an acute ear infection, otitis media with effusion refers only to fluid behind the ear drum and not infection. Children with repeated ear and sinus infections and allergy problems are the most likely to get otitis media with effusion. CAUSES  The most frequent cause  of the fluid buildup is dysfunction of the eustachian tubes. These are the tubes that drain fluid in the ears to the back of the nose (nasopharynx). SYMPTOMS   The main symptom of this condition is hearing loss. As a result, you or your child may:  Listen to the TV at a loud volume.  Not respond to questions.  Ask "what" often when spoken to.  Mistake or confuse one sound or word for another.  There may be a sensation of fullness or pressure but usually not pain. DIAGNOSIS   Your health care provider will diagnose this condition by examining you or your child's ears.  Your health care provider may test the pressure in you or your child's ear with a tympanometer.  A hearing test may be conducted if the problem persists. TREATMENT   Treatment depends on the duration and the effects of the effusion.  Antibiotics, decongestants, nose drops, and cortisone-type drugs (tablets or nasal spray) may not be helpful.  Children with persistent ear effusions may have delayed language or behavioral problems. Children at risk for developmental delays in hearing, learning, and speech may require referral to a specialist earlier than children not at risk.  You or your child's health care provider may suggest a referral to an ear, nose, and throat surgeon for treatment. The following may help restore normal hearing:  Drainage of fluid.  Placement of ear tubes (tympanostomy tubes).  Removal of adenoids (adenoidectomy). HOME CARE INSTRUCTIONS  Avoid secondhand smoke.  Infants who are breastfed are less likely to have this condition.  Avoid feeding infants while they are lying flat.  Avoid known environmental allergens.  Avoid people who are sick. SEEK MEDICAL CARE IF:   Hearing is not better in 3 months.  Hearing is worse.  Ear pain.  Drainage from the ear.  Dizziness. MAKE SURE YOU:   Understand these instructions.  Will watch your condition.  Will get help right away if  you are not doing well or get worse. This information is not intended to replace advice given to you by your health care provider. Make sure you discuss any questions you have with your health care provider. Document Released: 04/05/2004 Document Revised: 03/19/2014 Document Reviewed: 09/23/2012 Elsevier Interactive Patient Education  2017 [employer].

## 2016-03-14 NOTE — Progress Notes (Signed)
Pre visit review using our clinic review tool, if applicable. No additional management support is needed unless otherwise documented below in the visit note. 

## 2016-03-21 ENCOUNTER — Telehealth: Payer: Self-pay

## 2016-03-21 MED ORDER — TRAMADOL HCL 50 MG PO TABS: 50.0000 mg | ORAL_TABLET | Freq: Four times a day (QID) | ORAL | 0 refills | Status: DC | PRN

## 2016-03-21 NOTE — Telephone Encounter (Signed)
Rx refill Tramadol 50 mg last refill 12/28/2015 Last Ov: 03/14/2016 Next Ov: 03/28/2016 Please advise

## 2016-03-21 NOTE — Telephone Encounter (Signed)
Refill for 30 days only.  OFFICE VISIT NEEDED prior to any more refills 

## 2016-03-22 ENCOUNTER — Telehealth: Payer: Self-pay | Admitting: Internal Medicine

## 2016-03-22 DIAGNOSIS — T360X5A Adverse effect of penicillins, initial encounter: Principal | ICD-10-CM

## 2016-03-22 DIAGNOSIS — L270 Generalized skin eruption due to drugs and medicaments taken internally: Secondary | ICD-10-CM

## 2016-03-22 MED ORDER — PREDNISONE 10 MG PO TABS: ORAL_TABLET | ORAL | 0 refills | Status: DC

## 2016-03-22 NOTE — Telephone Encounter (Signed)
Appointment scheduled 01/16/17

## 2016-03-22 NOTE — Telephone Encounter (Signed)
Reason for call:rash   Symptoms:rash on legs, buttock, chest, neck, ears, spreading, no shortness of breath  Duration Thursday night  Medications:finishted Amoxicillin  Last seen for this problem: Seen MD:[id number] Arnett, FNP Please advise

## 2016-03-22 NOTE — Telephone Encounter (Signed)
Spoke with pt-  Tried benadryl with little relief.   Ear pain and congestion improving.   Started amoxicillin and then 2 days later noticed rash on right, buttocks, neck. Not spreading.   Added as drug allergy  No SOB, tongue swelling.  Agreed on 4 day prednisone

## 2016-03-22 NOTE — Telephone Encounter (Signed)
Pt called and stated that she came in last week for a sinus infection and was prescribed an antibiotic. Pt started medication on Wednesday and by Thursday noticed that she had a rash. The rash is on legs, and buttocks. Please advise, thank you!  Call pt @ [phone]

## 2016-03-28 ENCOUNTER — Ambulatory Visit: Payer: Medicare Other | Admitting: Internal Medicine

## 2016-03-30 ENCOUNTER — Telehealth: Payer: Self-pay | Admitting: Internal Medicine

## 2016-03-30 MED ORDER — AMPHETAMINE-DEXTROAMPHETAMINE 20 MG PO TABS: 20.0000 mg | ORAL_TABLET | Freq: Two times a day (BID) | ORAL | 0 refills | Status: DC

## 2016-03-30 NOTE — Telephone Encounter (Signed)
She can pick it up on Monday.

## 2016-03-30 NOTE — Telephone Encounter (Signed)
Rx request Adderall LOV 03/14/2016 Next OV 04/30/2016\ Rx Last refilled 12/27/15 Please advise

## 2016-03-30 NOTE — Telephone Encounter (Signed)
Pt has an appt on 2/19 at 2:30. She will need a refill on her amphetamine-dextroamphetamine (ADDERALL20MG  tablet. She did have an appt scheduled on 1/17, office closed for snow.

## 2016-04-02 NOTE — Telephone Encounter (Signed)
Notified patient to pick up Rx at office

## 2016-04-20 ENCOUNTER — Other Ambulatory Visit: Payer: Self-pay | Admitting: Internal Medicine

## 2016-04-20 NOTE — Telephone Encounter (Signed)
Refilled for one month

## 2016-04-20 NOTE — Telephone Encounter (Signed)
Last office visit 12/28/15 Last filled 03/22/16 Next office visit 04/30/16

## 2016-04-20 NOTE — Telephone Encounter (Signed)
Rx faxed to [facility].

## 2016-04-28 IMAGING — MG MM DIGITAL SCREENING BILATERAL
1 series · 4 of 4 positions shown · non-contrast
Comparison: Previous exam(s).

CLINICAL DATA: Screening.

EXAM:
DIGITAL SCREENING BILATERAL MAMMOGRAM WITH CAD

[R CC · right · 4 of 4 slices shown]
[im 1/4]
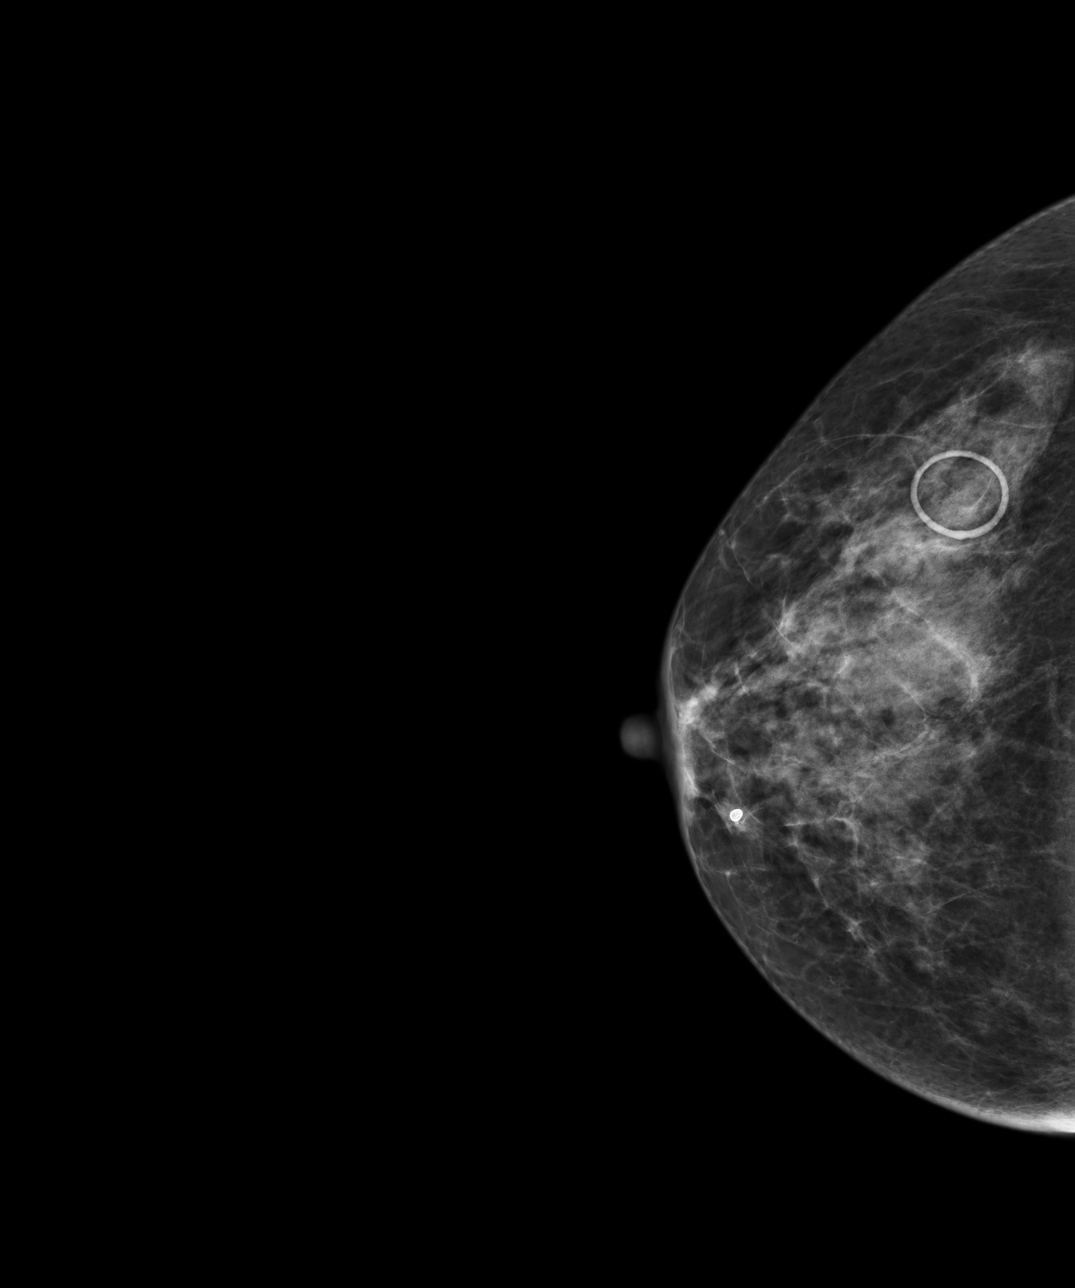
[im 2/4]
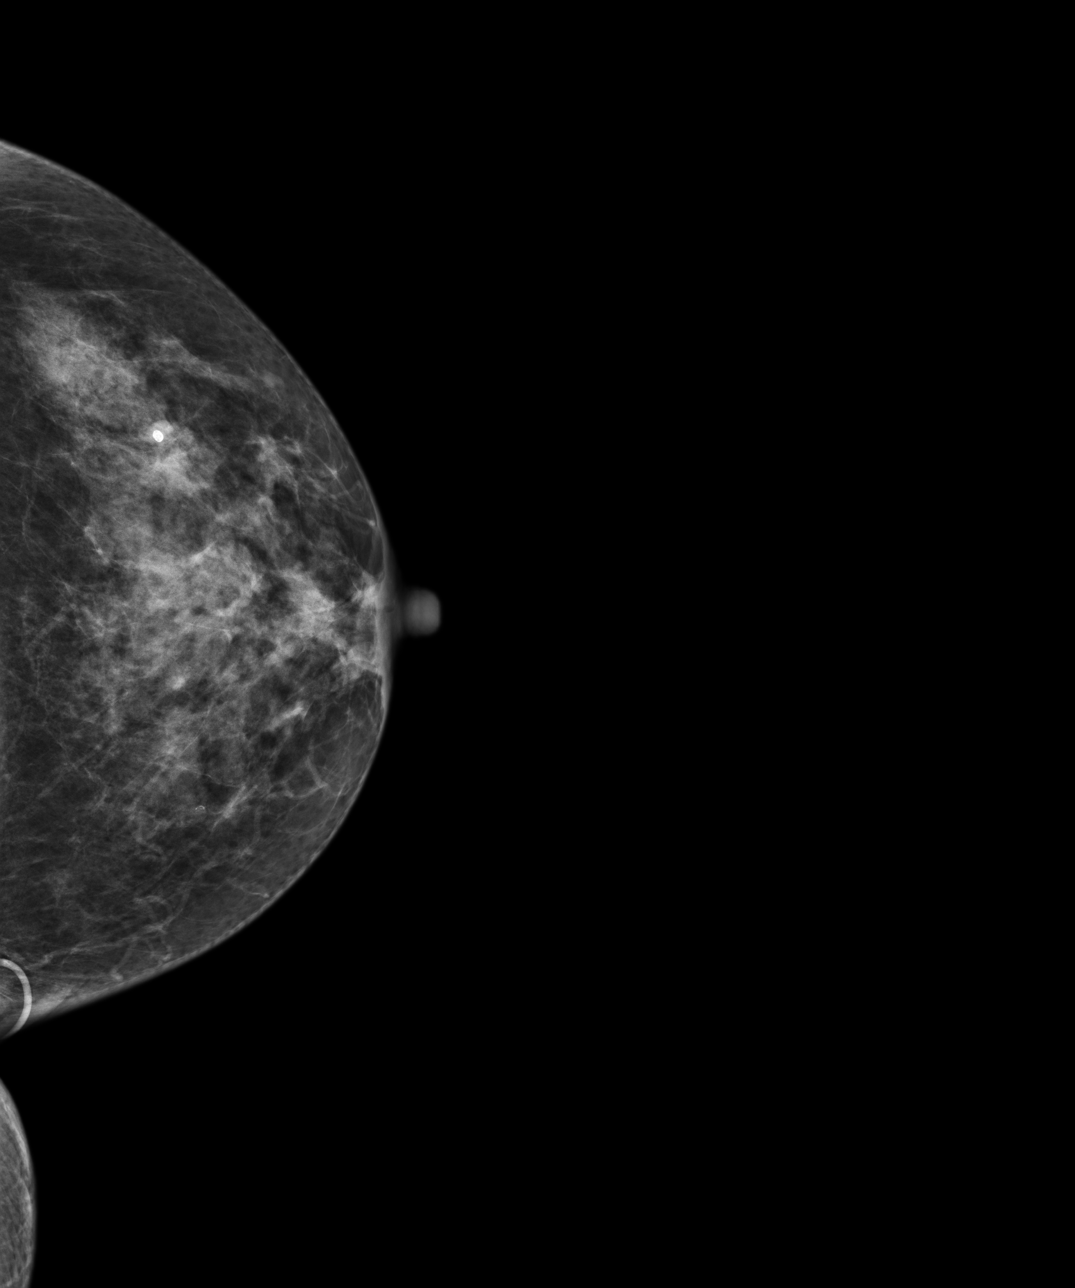
[im 3/4]
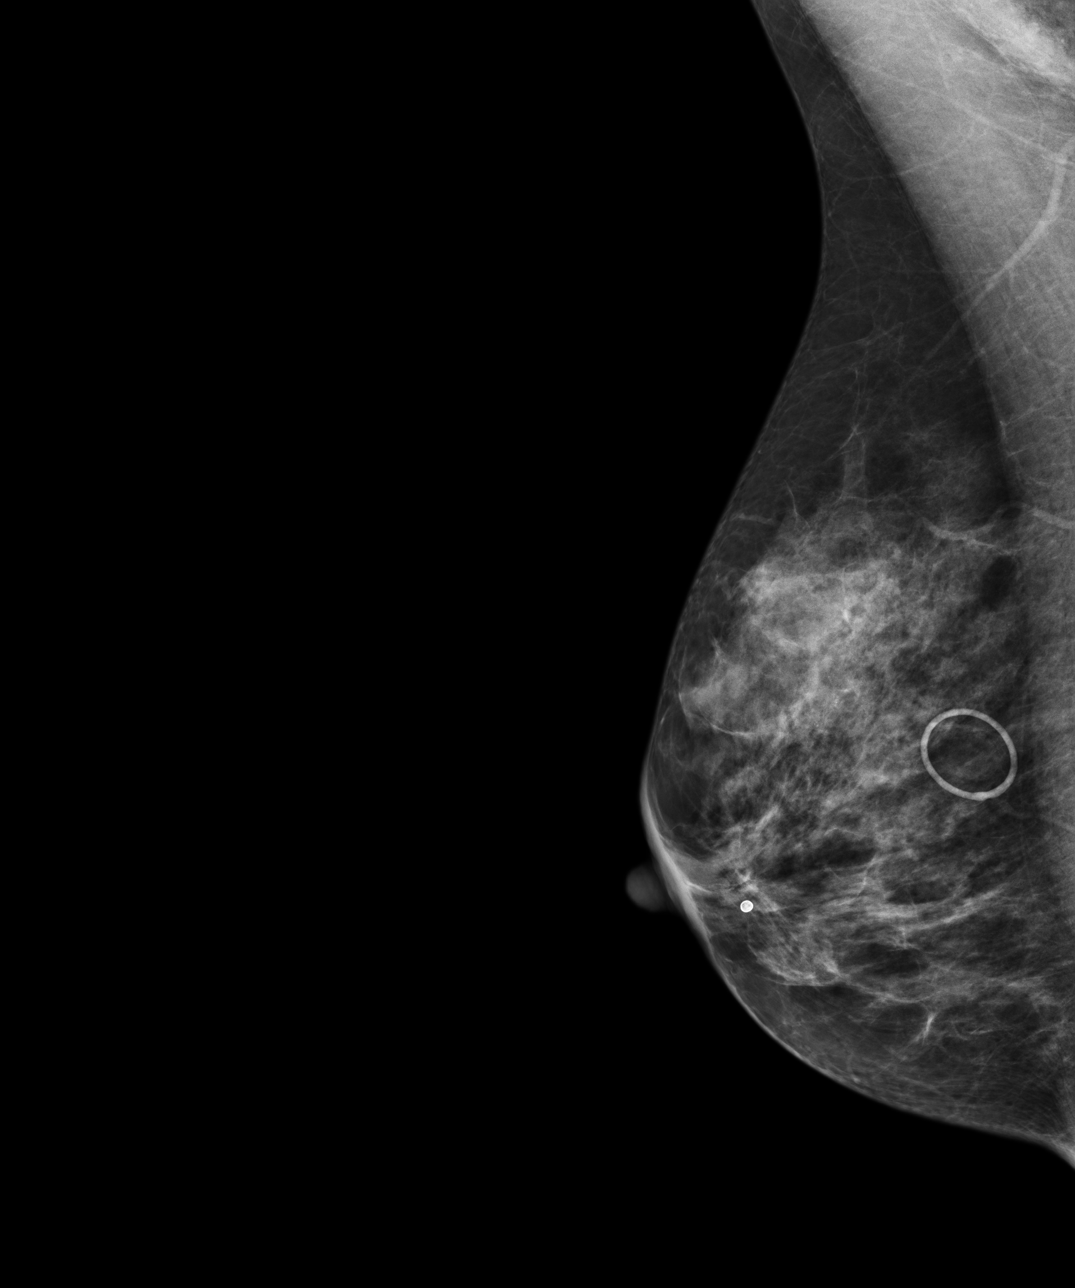
[im 4/4]
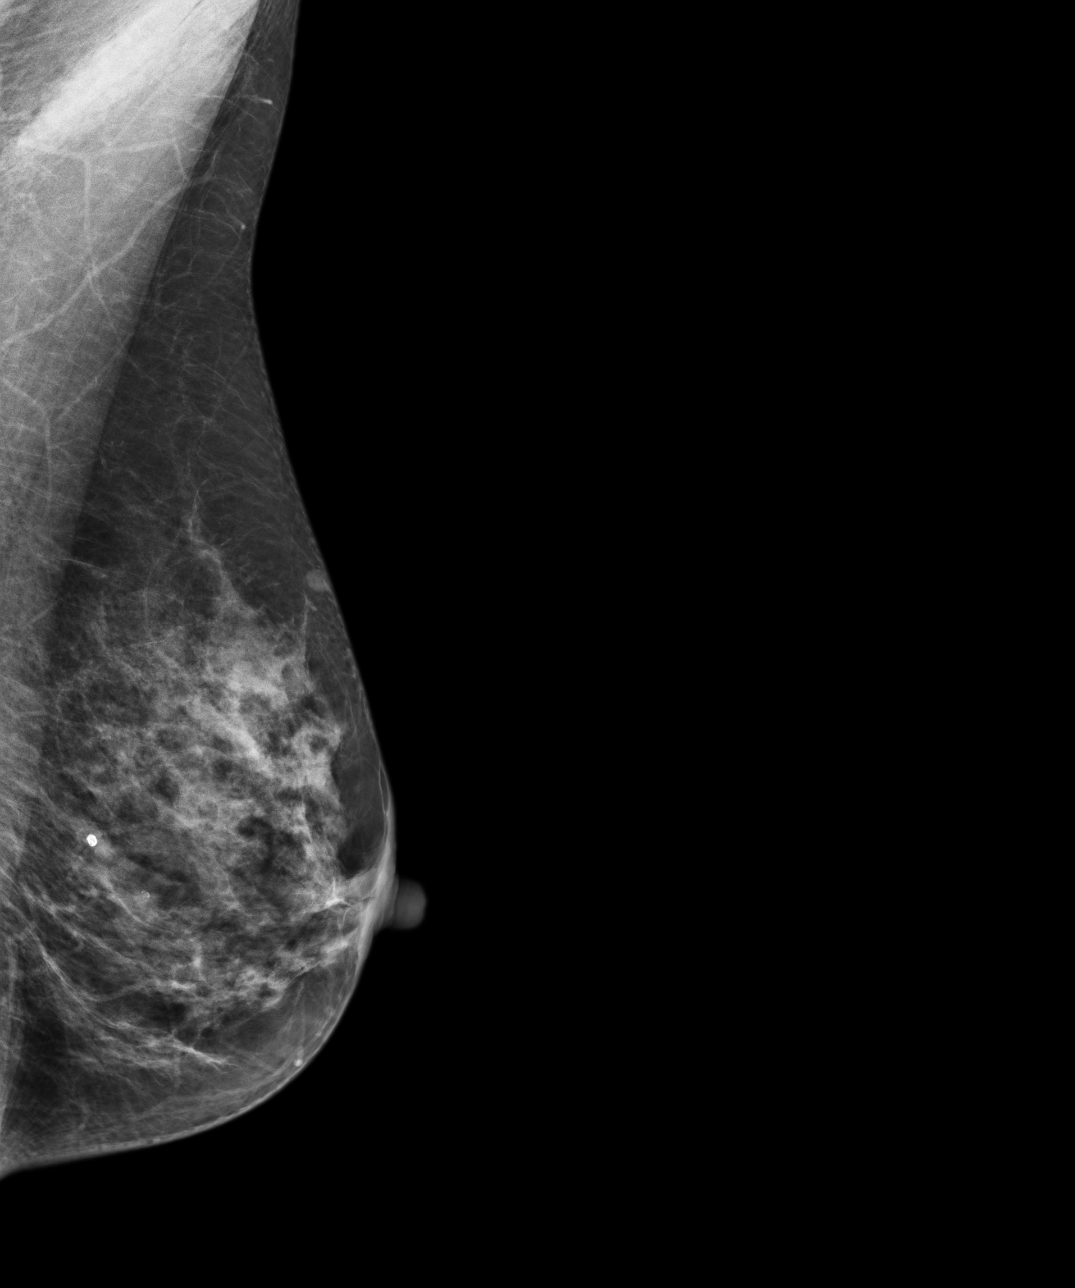

[4 of 4 positions shown; findings below may reference images not displayed]

ACR Breast Density Category d: The breast tissue is extremely dense,
which lowers the sensitivity of mammography.
FINDINGS: In the right breast, calcifications warrant further evaluation with
magnified views. In the left breast, no findings suspicious for
malignancy. Images were processed with CAD.
IMPRESSION: Further evaluation is suggested for calcifications in the right
breast.

RECOMMENDATION:
Diagnostic mammogram of the right breast. (Code:[id number])

The patient will be contacted regarding the findings, and additional
imaging will be scheduled.

BI-RADS CATEGORY  0: Incomplete. Need additional imaging evaluation
and/or prior mammograms for comparison.

## 2016-04-30 ENCOUNTER — Encounter: Payer: Self-pay | Admitting: Internal Medicine

## 2016-04-30 ENCOUNTER — Ambulatory Visit (INDEPENDENT_AMBULATORY_CARE_PROVIDER_SITE_OTHER): Payer: Medicare Other | Admitting: Internal Medicine

## 2016-04-30 VITALS — BP 148/92 | HR 81 | Resp 16 | Wt 120.0 lb

## 2016-04-30 DIAGNOSIS — Z79899 Other long term (current) drug therapy: Secondary | ICD-10-CM

## 2016-04-30 DIAGNOSIS — M1992 Post-traumatic osteoarthritis, unspecified site: Secondary | ICD-10-CM | POA: Diagnosis not present

## 2016-04-30 DIAGNOSIS — E7800 Pure hypercholesterolemia, unspecified: Secondary | ICD-10-CM

## 2016-04-30 DIAGNOSIS — K209 Esophagitis, unspecified without bleeding: Secondary | ICD-10-CM

## 2016-04-30 DIAGNOSIS — M810 Age-related osteoporosis without current pathological fracture: Secondary | ICD-10-CM

## 2016-04-30 DIAGNOSIS — M153 Secondary multiple arthritis: Secondary | ICD-10-CM | POA: Diagnosis not present

## 2016-04-30 DIAGNOSIS — F909 Attention-deficit hyperactivity disorder, unspecified type: Secondary | ICD-10-CM

## 2016-04-30 MED ORDER — TRAZODONE HCL 50 MG PO TABS: 25.0000 mg | ORAL_TABLET | Freq: Every day | ORAL | 5 refills | Status: DC

## 2016-04-30 MED ORDER — AMPHETAMINE-DEXTROAMPHETAMINE 20 MG PO TABS: 20.0000 mg | ORAL_TABLET | Freq: Two times a day (BID) | ORAL | 0 refills | Status: DC

## 2016-04-30 MED ORDER — AMLODIPINE BESYLATE 5 MG PO TABS: 5.0000 mg | ORAL_TABLET | Freq: Every day | ORAL | 5 refills | Status: DC

## 2016-04-30 MED ORDER — ALENDRONATE SODIUM 70 MG PO TABS: 70.0000 mg | ORAL_TABLET | ORAL | 11 refills | Status: DC

## 2016-04-30 MED ORDER — FUROSEMIDE 20 MG PO TABS: 20.0000 mg | ORAL_TABLET | Freq: Every day | ORAL | 3 refills | Status: DC | PRN

## 2016-04-30 MED ORDER — HYDROCODONE-ACETAMINOPHEN 10-325 MG PO TABS: 1.0000 | ORAL_TABLET | Freq: Every evening | ORAL | 0 refills | Status: DC | PRN

## 2016-04-30 MED ORDER — ALPRAZOLAM 0.5 MG PO TABS: 0.5000 mg | ORAL_TABLET | Freq: Every evening | ORAL | 5 refills | Status: DC | PRN

## 2016-04-30 MED ORDER — BENZONATATE 200 MG PO CAPS: 200.0000 mg | ORAL_CAPSULE | Freq: Three times a day (TID) | ORAL | 3 refills | Status: DC | PRN

## 2016-04-30 MED ORDER — PANTOPRAZOLE SODIUM 40 MG PO TBEC: 40.0000 mg | DELAYED_RELEASE_TABLET | Freq: Every day | ORAL | 4 refills | Status: DC
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
## 2016-04-30 NOTE — Progress Notes (Signed)
Pre visit review using our clinic review tool, if applicable. No additional management support is needed unless otherwise documented below in the visit note. 

## 2016-04-30 NOTE — Patient Instructions (Signed)
I RECOMMEND Starting alendronate for your osteoporosis  One tablet weekly,  Follow directions carefully to avoid esophagitis  continUE calcium and vitamin d   We will resume use of vicodin CAREFULLY ,  NOT MORE THAN ONCE DAILY

## 2016-05-01 LAB — COMPREHENSIVE METABOLIC PANEL
ALK PHOS: 74 U/L (ref 39–117)
ALT: 15 U/L (ref 0–35)
AST: 27 U/L (ref 0–37)
Albumin: 4.6 g/dL (ref 3.5–5.2)
BUN: 19 mg/dL (ref 6–23)
CO2: 27 mEq/L (ref 19–32)
Calcium: 10.1 mg/dL (ref 8.4–10.5)
Chloride: 101 mEq/L (ref 96–112)
Creatinine, Ser: 0.83 mg/dL (ref 0.40–1.20)
GFR: 72.08 mL/min (ref >60.00)
GLUCOSE: 77 mg/dL (ref 70–99)
POTASSIUM: 4.1 meq/L (ref 3.5–5.1)
SODIUM: 137 meq/L (ref 135–145)
TOTAL PROTEIN: 7.3 g/dL (ref 6.0–8.3)
Total Bilirubin: 0.4 mg/dL (ref 0.2–1.2)

## 2016-05-01 LAB — LDL CHOLESTEROL, DIRECT: Direct LDL: 167 mg/dL

## 2016-05-01 IMAGING — MG MM DIGITAL DIAGNOSTIC UNILAT R
3 series · 3 of 3 positions shown · non-contrast
Comparison: Previous examinations, including the screening
mammogram dated 10/19/2014.

CLINICAL DATA: Right breast calcifications in the 12 o'clock
position on a recent screening mammogram.

EXAM:
DIGITAL DIAGNOSTIC RIGHT MAMMOGRAM

[R ML (1 of 2)]
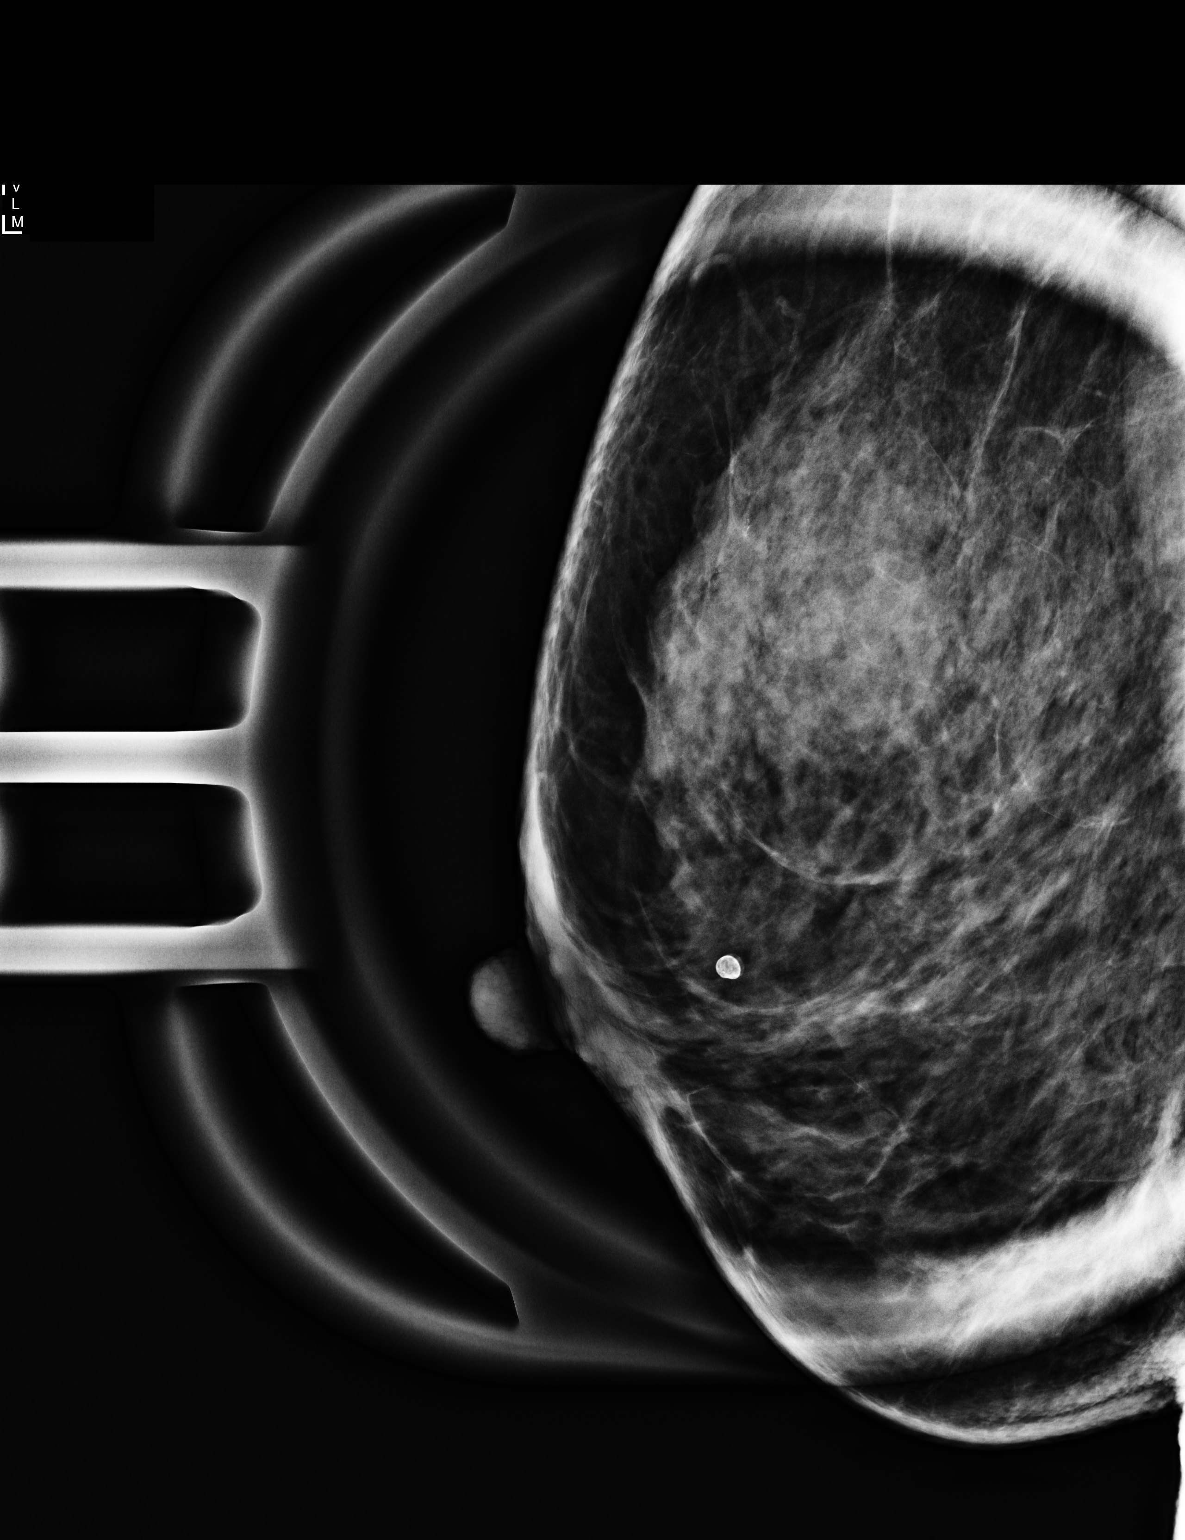

[R CC]
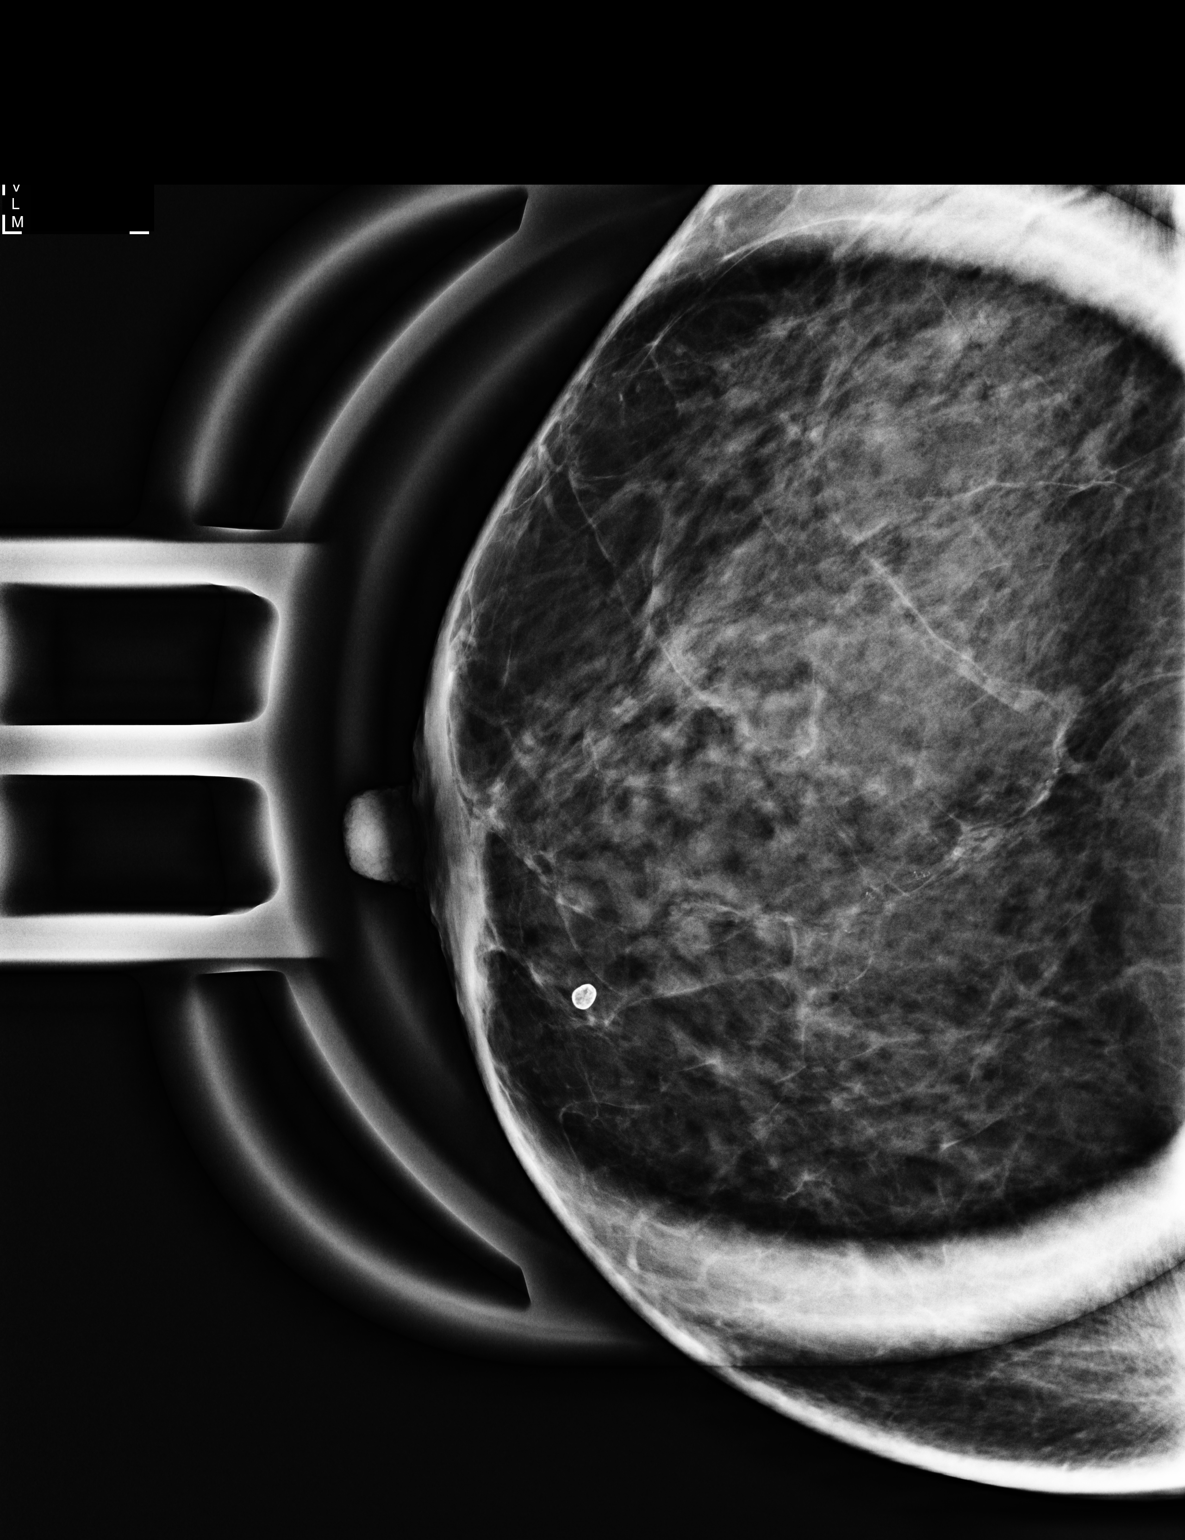

[R ML (2 of 2)]
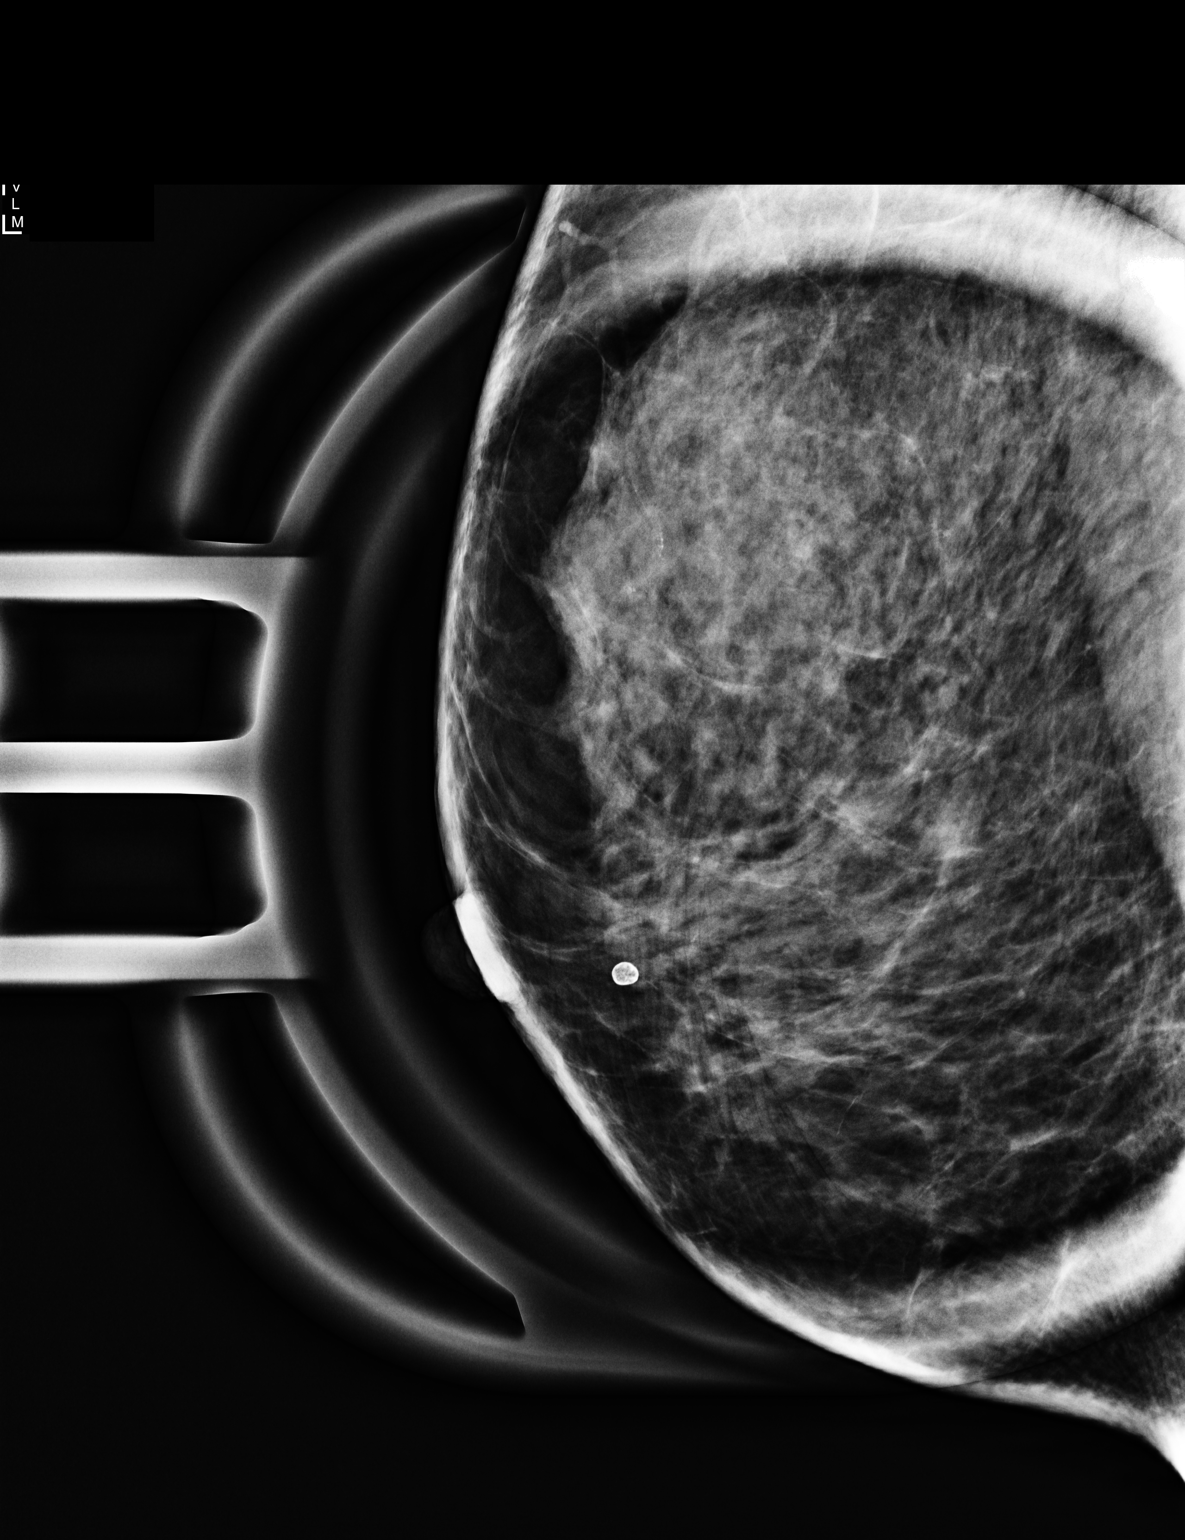

[3 of 3 positions shown; findings below may reference images not displayed]

ACR Breast Density Category d: The breast tissue is extremely dense,
which lowers the sensitivity of mammography.
FINDINGS: Spot magnification views of the right breast demonstrate vascular
calcifications in the 12 o'clock position of the breast,
corresponding to the calcifications seen on the recent screening
mammogram.
IMPRESSION: Benign right breast vascular calcifications. No evidence of
malignancy.

RECOMMENDATION:
Bilateral screening mammogram in 1 year.

I have discussed the findings and recommendations with the patient.
Results were also provided in writing at the conclusion of the
visit. If applicable, a reminder letter will be sent to the patient
regarding the next appointment.

BI-RADS CATEGORY  2: Benign.

## 2016-05-01 NOTE — Assessment & Plan Note (Signed)
Recommended treatment with weekly alendronate. Risks and benefits discussed
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
## 2016-05-01 NOTE — Assessment & Plan Note (Addendum)
Trial of vyvanse noted no improvement . We have resume adderall 20 mg bid.  Refills for 3 months given.

## 2016-05-03 ENCOUNTER — Encounter: Payer: Self-pay | Admitting: Internal Medicine

## 2016-05-03 ENCOUNTER — Other Ambulatory Visit: Payer: Self-pay | Admitting: Internal Medicine

## 2016-05-03 DIAGNOSIS — E7800 Pure hypercholesterolemia, unspecified: Secondary | ICD-10-CM

## 2016-05-04 ENCOUNTER — Encounter: Payer: Self-pay | Admitting: Internal Medicine

## 2016-05-16 ENCOUNTER — Telehealth: Payer: Self-pay

## 2016-05-16 ENCOUNTER — Encounter: Payer: Self-pay | Admitting: Family

## 2016-05-16 DIAGNOSIS — R21 Rash and other nonspecific skin eruption: Secondary | ICD-10-CM | POA: Diagnosis not present

## 2016-05-16 NOTE — Telephone Encounter (Signed)
Patient came into the office today.  Per patient she was told she had an appointment today to see the NP regarding a rash on her chest. Per Documentation called this am and was scheduled to see NP tomorrow at 115.  Pulled patient back to talk to her to explain that her appointment is tomorrow not today.  She states that she woke up last night itching her chest.  Visibly there slightly redden areas on chest, no raised areas, no marks present.  Patient states that it goes into her facial areas, not able to see due to her makeup on her face. States it itches.  Asked if she has changed her soap/makeup/ or any medications in the past few days days.  She declines any of the mentioned. States nothing has changed at all.  No throat swelling or trouble breathing noted.  Consulted with PCP and due to the issue not being urgent recommended the patient to keep appointment tomorrow with NP.  Advised patient and she was very upset, states that she needs something now.  I advised her that she could go to a urgent care today, suggested [facility] if she can't wait until tomorrow.  She was visibly not happy with that response and left the office.  Appointment remains on the schedule for tomorrow to see NP.

## 2016-05-17 ENCOUNTER — Other Ambulatory Visit: Payer: Self-pay | Admitting: Internal Medicine

## 2016-05-17 ENCOUNTER — Ambulatory Visit: Payer: Medicare Other | Admitting: Family

## 2016-05-17 NOTE — Telephone Encounter (Signed)
Refilled on 04/20/2016 Last Ov: 04/30/2016 Next Ov: 08/01/2016  Please advise.

## 2016-05-18 NOTE — Telephone Encounter (Signed)
Signed Rx faxed to [facility].

## 2016-05-18 NOTE — Telephone Encounter (Signed)
Called patient and advised her of apologies and that she could call office and ask for Juliann Pulse, if she has a problem in the future. Patient was upset because no one took her vitals are anything just said go to [facility] or [employer].  Patient reported she feels much better with someone calling.

## 2016-05-18 NOTE — Telephone Encounter (Signed)
REFILLED

## 2016-05-22 ENCOUNTER — Telehealth: Payer: Self-pay | Admitting: Internal Medicine

## 2016-05-22 MED ORDER — PREDNISONE 10 MG PO TABS: ORAL_TABLET | ORAL | 0 refills | Status: DC

## 2016-05-22 NOTE — Telephone Encounter (Signed)
Patient called about re-occurring rash, patient,realized today when she broke out with the first rash was 1.5 days after taking the alendronate, after she took dose this Sunday patient started to breakout with same rash again today. Patient was seen at [facility] for first rash and was given prednisone taper for 7 days patient completed this taper on 05/21/16. Patient is having itching and rash on neck and chest area, which occurred 1.5 days after alendronate second dose.

## 2016-05-22 NOTE — Telephone Encounter (Signed)
Stop the alendronate.  Add  It to her list of allergies  Prednisone taper has been sent  to [facility].  Take benadryl ,  allegra or zyrtec for the itching

## 2016-05-22 NOTE — Telephone Encounter (Signed)
Patient notified and voiced understanding, removed alendronate from med- list and added to allergy list.

## 2016-05-30 ENCOUNTER — Ambulatory Visit (INDEPENDENT_AMBULATORY_CARE_PROVIDER_SITE_OTHER): Payer: Medicare Other | Admitting: Family

## 2016-05-30 ENCOUNTER — Encounter: Payer: Self-pay | Admitting: Family

## 2016-05-30 VITALS — BP 128/88 | HR 95 | Temp 98.3°F | Wt 118.0 lb

## 2016-05-30 DIAGNOSIS — T7840XD Allergy, unspecified, subsequent encounter: Secondary | ICD-10-CM | POA: Diagnosis not present

## 2016-05-30 MED ORDER — METHYLPREDNISOLONE SODIUM SUCC 125 MG IJ SOLR
40.0000 mg | Freq: Once | INTRAMUSCULAR | Status: AC
  Administered 2016-05-30: 40 mg via INTRAMUSCULAR

## 2016-05-30 MED ORDER — PREDNISONE 10 MG PO TABS: ORAL_TABLET | ORAL | 0 refills | Status: DC

## 2016-05-30 MED ORDER — METHYLPREDNISOLONE SODIUM SUCC 125 MG IJ SOLR: 60.0000 mg | Freq: Once | INTRAMUSCULAR | Status: DC

## 2016-05-30 NOTE — Patient Instructions (Signed)
Take zantac twice daily for histamine 2 blockade  May fill oral prednisone if rash does  Not start to improve in a couple of days.   Let me know how you are doing.  No more fosamax for you !

## 2016-05-30 NOTE — Telephone Encounter (Signed)
Pt called and stated that the rash is back and thinks that it may be shingles. She states that is is itchy, and burning and looks like the shingles rash that her sister had when she had shingles. Please advise, thank you!  Call pt @ [phone]

## 2016-05-30 NOTE — Telephone Encounter (Signed)
Patient scheduled for 11:30 today

## 2016-05-30 NOTE — Progress Notes (Signed)
Pre visit review using our clinic review tool, if applicable. No additional management support is needed unless otherwise documented below in the visit note. 

## 2016-05-30 NOTE — Progress Notes (Signed)
Subjective:    Patient ID: Sonya Davis, female    DOB: July 02, 1945, 71 y.o.   MRN: [id number]  CC: Sonya Davis is a 71 y.o. female who presents today for an acute visit.    HPI: Hive like Rash 3 weeks ago across neck and chest. Waxed and waned past couple of days. Has moved further up neck towards chin. No SOB, trouble swallowing.  suspects triggered by fosamax as only new medication. She tried to take fosamax one more time so see if fosamax and rash worsened.    Went to urgent care 05/16/16, took 7 day course of prednisone with relief from itching however rash persisted. PCP called 7 day course of prednisone as well.   Taking 50mg  benadryl every 4 hours. Has been taking zantac every night.   Had zoster vaccine  Has seasonal allergies; has been on allergy shots.         HISTORY:  Past Medical History:  Diagnosis Date  . ADD (attention deficit disorder)   . Allergy    seasonal  . Cataract    surgery to remove them  . Endometriosis of ovary    prior GYN [name]  . 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 Fracture closed, pubis ([place]) 1981  . Fracture of bone of left shoulder 1981  . Fracture of maxilla ([place]) 1981  . Gastritis   . GERD (gastroesophageal reflux disease)   . History of ovarian cyst   . History of pericarditis 1988   occurred after flu shot, with pneumonia  . Osteoporosis   . Ovarian cyst    right, endometriosis  . Pericarditis    does not receive flu vaccine becuase of this  . Rheumatoid arthritis(714.0) 2000   per patient by labs   Past Surgical History:  Procedure Laterality Date  . ABDOMINAL HYSTERECTOMY     secondary to endometriosis  . ANTERIOR CRUCIATE LIGAMENT REPAIR  age 21   left  . APPENDECTOMY     at age 18  . ARTHROSCOPIC REPAIR ACL  1999   left knee  . CARPAL TUNNEL RELEASE     bilateral surgeries  . CATARACT EXTRACTION  Mar 2013   right eye, [place]  .  CYSTECTOMY  1963  . EYE SURGERY  2005   left cataract   . septoplasty  Dec 2006   [name],   . SEPTOPLASTY  2006   Family History  Problem Relation Age of Onset  . Cancer Mother     lung  . Heart disease Father   . Crohn's disease Sister   . Breast cancer Cousin   . Colon cancer Neg Hx     Allergies: Influenza vaccines; Alendronate; and Amoxicillin Current Outpatient Prescriptions on File Prior to Visit  Medication Sig Dispense Refill  . ADVAIR DISKUS 250-50 MCG/DOSE AEPB INHALE ONE PUFF INTO LUNGS TWICE DAILY 60 each 1  . albuterol (PROVENTIL) (2.5 MG/3ML) 0.083% nebulizer solution Take 3 mLs (2.5 mg total) by nebulization every 6 (six) hours as needed for wheezing or shortness of breath. 75 mL 0  . ALPRAZolam (XANAX) 0.5 MG tablet Take 1 tablet (0.5 mg total) by mouth at bedtime as needed. 30 tablet 5  . amLODipine (NORVASC) 5 MG tablet Take 1 tablet (5 mg total) by mouth daily. 30 tablet 5  . amphetamine-dextroamphetamine (ADDERALL) 20 MG tablet Take 1 tablet (20 mg total) by mouth 2 (two) times daily.  60 tablet 0  . amphetamine-dextroamphetamine (ADDERALL) 20 MG tablet Take 1 tablet (20 mg total) by mouth 2 (two) times daily. 60 tablet 0  . amphetamine-dextroamphetamine (ADDERALL) 20 MG tablet Take 1 tablet (20 mg total) by mouth 2 (two) times daily. May refill on or after May 27 2016 60 tablet 0  . benzonatate (TESSALON) 200 MG capsule Take 1 capsule (200 mg total) by mouth 3 (three) times daily as needed for cough. 60 capsule 3  . calcium carbonate (OS-CAL) 600 MG TABS Take 600 mg by mouth 2 (two) times daily with a meal.      . cetirizine (ZYRTEC) 10 MG tablet Take 10 mg by mouth daily.    . cholecalciferol (VITAMIN D) 1000 UNITS tablet Take 1,000 Units by mouth daily.      [name] Calcium (STOOL SOFTENER PO) Take by mouth.      . doxycycline (VIBRA-TABS) 100 MG tablet Take 1 tablet (100 mg total) by mouth 2 (two) times daily. 14 tablet 0  . Eszopiclone 3 MG TABS  Take 1 tablet (3 mg total) by mouth at bedtime. Take immediately before bedtime (Patient not taking: Reported on 04/30/2016) 30 tablet 5  . fexofenadine (ALLEGRA) 180 MG tablet Take 1 tablet (180 mg total) by mouth daily. 90 tablet 1  . fluconazole (DIFLUCAN) 150 MG tablet Take 1 tablet (150 mg total) by mouth daily. 2 tablet 1  . furosemide (LASIX) 20 MG tablet Take 1 tablet (20 mg total) by mouth daily as needed. 30 tablet 3  . HYDROcodone-acetaminophen (NORCO) 10-325 MG tablet Take 1 tablet by mouth at bedtime as needed. For severe pain 30 tablet 0  . pantoprazole (PROTONIX) 40 MG tablet Take 1 tablet (40 mg total) by mouth daily. 30 tablet 4  . predniSONE (DELTASONE) 10 MG tablet 6 tablets on Day 1 , then reduce by 1 tablet daily until gone (Patient not taking: Reported on 05/30/2016) 21 tablet 0  . PROAIR HFA 108 (90 Base) MCG/ACT inhaler TAKE TWO PUFFS EVERY 6 HOURS AS NEEDED FOR WHEEZE OR SHORTNESS OF BREATH. 8.5 g 0  . traMADol (ULTRAM) 50 MG tablet TAKE (1) TABLET EVERY SIX HOURS AS NEEDED. 120 tablet 5  . traZODone (DESYREL) 50 MG tablet Take 0.5-1 tablets (25-50 mg total) by mouth at bedtime. 30 tablet 5   No current facility-administered medications on file prior to visit.     Social History  Substance Use Topics  . Smoking status: Former Smoker    Types: Cigarettes    Quit date: 02/11/2001  . Smokeless tobacco: Never Used  . Alcohol use 3.0 oz/week    5 Standard drinks or equivalent per week    Review of Systems  Constitutional: Negative for chills and fever.  HENT: Negative for sore throat and trouble swallowing.   Respiratory: Negative for cough, shortness of breath and wheezing.   Cardiovascular: Negative for chest pain and palpitations.  Gastrointestinal: Negative for nausea and vomiting.  Skin: Positive for rash.      Objective:    BP 128/88 (BP Location: Left Arm, Patient Position: Sitting, Cuff Size: Normal)   Pulse 95   Temp 98.3 F (36.8 C) (Oral)   Wt 118 lb  (53.5 kg)   SpO2 96%   BMI 20.90 kg/m    Physical Exam  Constitutional: She appears well-developed and well-nourished.  Eyes: Conjunctivae are normal.  Cardiovascular: Normal rate, regular rhythm, normal heart sounds and normal pulses.   Pulmonary/Chest: Effort normal and breath sounds normal.  She has no wheezes. She has no rhonchi. She has no rales.  Neurological: She is alert.  Skin: Skin is warm and dry.     Localized maculopapular excoriated lesions in center of chest with extension up middle of neck. No vesicles, discharge, streaking.  Psychiatric: She has a normal mood and affect. Her speech is normal and behavior is normal. Thought content normal.  Vitals reviewed.      Assessment & Plan:  1. Allergic reaction, subsequent encounter Patient seems to have proven that the fosamax has been the trigger. She has significant h/o seasonal and drug allergies.  Reassured as no systemic features to indicate respiratory distress however the pruritic nature of rash has been very frustrating for patient. We decided today we would try IM solu medrol and then only if no relief would patient start prednisone taper again. Education provided on full histamine blockade with benadryl and zantac. Return precautions given.  - predniSONE (DELTASONE) 10 MG tablet; Take 4 tablets ( total 40 mg) by mouth for 2 days; take 3 tablets ( total 30 mg) by mouth for 2 days; take 2 tablets ( total 20 mg) by mouth for 1 day; take 1 tablet ( total 10 mg) by mouth for 1 day.  Dispense: 17 tablet; Refill: 0 - methylPREDNISolone sodium succinate (SOLU-MEDROL) 125 mg/2 mL injection 40 mg; Inject 0.64 mLs (40 mg total) into the muscle once.     I am having Ms. Cid maintain her calcium carbonate, cholecalciferol, Docusate Calcium (STOOL SOFTENER PO), cetirizine, fexofenadine, PROAIR HFA, Eszopiclone, doxycycline, fluconazole, ADVAIR DISKUS, albuterol, amphetamine-dextroamphetamine, furosemide, benzonatate,  pantoprazole, amLODipine, ALPRAZolam, traZODone, amphetamine-dextroamphetamine, amphetamine-dextroamphetamine, HYDROcodone-acetaminophen, traMADol, and predniSONE.   No orders of the defined types were placed in this encounter.   Return precautions given.   Risks, benefits, and alternatives of the medications and treatment plan prescribed today were discussed, and patient expressed understanding.   Education regarding symptom management and diagnosis given to patient on AVS.  Continue to follow with TULLO, Aris Everts, MD for routine health maintenance.   Sonya Davis and I agreed with plan.   Mable Paris, FNP

## 2016-05-31 ENCOUNTER — Encounter: Payer: Self-pay | Admitting: Family

## 2016-05-31 ENCOUNTER — Other Ambulatory Visit: Payer: Self-pay | Admitting: Family

## 2016-05-31 DIAGNOSIS — R21 Rash and other nonspecific skin eruption: Secondary | ICD-10-CM

## 2016-05-31 MED ORDER — RANITIDINE HCL 150 MG PO TABS: 150.0000 mg | ORAL_TABLET | Freq: Two times a day (BID) | ORAL | 0 refills | Status: DC

## 2016-05-31 NOTE — Telephone Encounter (Signed)
Pt saw Joycelyn Schmid on 05/30/16. Pt called about the prenizone shot did not work. Pt woke up this morning and realized that she had scratch until she bled. Please advise?  Call pt @ [phone]. Thank you!

## 2016-06-04 ENCOUNTER — Ambulatory Visit: Payer: Medicare Other

## 2016-06-12 ENCOUNTER — Other Ambulatory Visit: Payer: Self-pay | Admitting: Family

## 2016-06-12 DIAGNOSIS — R21 Rash and other nonspecific skin eruption: Secondary | ICD-10-CM

## 2016-06-14 ENCOUNTER — Other Ambulatory Visit: Payer: Self-pay | Admitting: Internal Medicine

## 2016-06-14 NOTE — Telephone Encounter (Signed)
Denied because it was refilled on 04/26/2016 with 5 refills.

## 2016-06-14 NOTE — Telephone Encounter (Deleted)
error 

## 2016-07-16 DIAGNOSIS — L239 Allergic contact dermatitis, unspecified cause: Secondary | ICD-10-CM | POA: Diagnosis not present

## 2016-07-16 DIAGNOSIS — D2311 Other benign neoplasm of skin of right eyelid, including canthus: Secondary | ICD-10-CM | POA: Diagnosis not present

## 2016-07-16 DIAGNOSIS — D2312 Other benign neoplasm of skin of left eyelid, including canthus: Secondary | ICD-10-CM | POA: Diagnosis not present

## 2016-08-01 ENCOUNTER — Encounter: Payer: Self-pay | Admitting: Internal Medicine

## 2016-08-01 ENCOUNTER — Ambulatory Visit (INDEPENDENT_AMBULATORY_CARE_PROVIDER_SITE_OTHER): Payer: Medicare Other | Admitting: Internal Medicine

## 2016-08-01 DIAGNOSIS — F909 Attention-deficit hyperactivity disorder, unspecified type: Secondary | ICD-10-CM | POA: Diagnosis not present

## 2016-08-01 DIAGNOSIS — M153 Secondary multiple arthritis: Secondary | ICD-10-CM

## 2016-08-01 DIAGNOSIS — M810 Age-related osteoporosis without current pathological fracture: Secondary | ICD-10-CM | POA: Diagnosis not present

## 2016-08-01 DIAGNOSIS — M1992 Post-traumatic osteoarthritis, unspecified site: Secondary | ICD-10-CM | POA: Diagnosis not present

## 2016-08-01 MED ORDER — AMPHETAMINE-DEXTROAMPHETAMINE 20 MG PO TABS: 20.0000 mg | ORAL_TABLET | Freq: Two times a day (BID) | ORAL | 0 refills | Status: DC

## 2016-08-01 MED ORDER — HYDROCODONE-ACETAMINOPHEN 10-325 MG PO TABS: 1.0000 | ORAL_TABLET | Freq: Every evening | ORAL | 0 refills | Status: DC | PRN

## 2016-08-01 MED ORDER — LIDOCAINE-PRILOCAINE 2.5-2.5 % EX CREA: 1.0000 "application " | TOPICAL_CREAM | CUTANEOUS | 3 refills | Status: AC | PRN

## 2016-08-01 MED ORDER — TRAZODONE HCL 50 MG PO TABS: 25.0000 mg | ORAL_TABLET | Freq: Every day | ORAL | 5 refills | Status: DC
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
## 2016-08-01 NOTE — Patient Instructions (Addendum)
I am sorry you had an allergic reaction to the alendronate.  WE will   Obtain PA for Prolia for osteoporosis management   I have refilled the vicodin for 45/month  For the next 3 months .  Keep using tramadol for daytime pain   I HAVE ALSO PRESCRIBED  Lidocaine/meloxciam compounded cream to try .  [facility] can mix it for you

## 2016-08-02 NOTE — Assessment & Plan Note (Signed)
Continue  adderall 20 mg bid.  Refills for 3 months given.  
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
## 2016-08-02 NOTE — Assessment & Plan Note (Signed)
Did not tolerate  treatment with weekly alendronate due to drug reaction/rash.  trial of Prolia advised

## 2016-08-03 ENCOUNTER — Telehealth: Payer: Self-pay | Admitting: Internal Medicine

## 2016-08-03 NOTE — Telephone Encounter (Signed)
Spoke with pt and informed her that she would need to call her insurance company and let them know what she is using and ask them if there is anything else that is covered by her insurance that would be cheaper.

## 2016-08-03 NOTE — Telephone Encounter (Signed)
Pt called and wanted to ask you in regards to her lidocaine-prilocaine (EMLA) cream. Pt wanted to know if there was anyway that we could find a cheaper cream that it is comparable and cheaper she paid $60.00 for a 4 oz tube. Could we call the insurance company and see if there is anything that they can do or inform us what might be an alternative. Please advise, thank you!  Call pt @ [phone] or [phone]

## 2016-08-03 NOTE — Telephone Encounter (Signed)
Mychart message sent.

## 2016-08-08 DIAGNOSIS — L239 Allergic contact dermatitis, unspecified cause: Secondary | ICD-10-CM | POA: Diagnosis not present

## 2016-08-08 DIAGNOSIS — D2339 Other benign neoplasm of skin of other parts of face: Secondary | ICD-10-CM | POA: Diagnosis not present

## 2016-08-08 DIAGNOSIS — D485 Neoplasm of uncertain behavior of skin: Secondary | ICD-10-CM | POA: Diagnosis not present

## 2016-08-20 ENCOUNTER — Other Ambulatory Visit: Payer: Self-pay | Admitting: Internal Medicine

## 2016-09-17 ENCOUNTER — Other Ambulatory Visit: Payer: Self-pay | Admitting: Internal Medicine

## 2016-09-17 DIAGNOSIS — K209 Esophagitis, unspecified without bleeding: Secondary | ICD-10-CM

## 2016-09-19 ENCOUNTER — Other Ambulatory Visit: Payer: Self-pay | Admitting: Internal Medicine

## 2016-10-18 ENCOUNTER — Telehealth: Payer: Self-pay | Admitting: Internal Medicine

## 2016-10-18 NOTE — Telephone Encounter (Signed)
Left pt message asking to call Allison back directly at [phone] to schedule AWV. Thanks! °  °*NOTE* Last AWV 06/06/15 °

## 2016-10-24 ENCOUNTER — Other Ambulatory Visit: Payer: Self-pay

## 2016-10-24 MED ORDER — ALPRAZOLAM 0.5 MG PO TABS: 0.5000 mg | ORAL_TABLET | Freq: Every evening | ORAL | 2 refills | Status: DC | PRN

## 2016-10-24 NOTE — Telephone Encounter (Signed)
Refilled: 04/30/2016 Last OV: 08/01/2016 Next OV: 11/01/2016

## 2016-10-24 NOTE — Telephone Encounter (Signed)
Refilled

## 2016-10-25 NOTE — Telephone Encounter (Signed)
Printed, signed, and faxed

## 2016-10-30 ENCOUNTER — Other Ambulatory Visit: Payer: Self-pay

## 2016-10-30 MED ORDER — FLUTICASONE-SALMETEROL 250-50 MCG/DOSE IN AEPB: INHALATION_SPRAY | RESPIRATORY_TRACT | 1 refills | Status: DC

## 2016-11-01 ENCOUNTER — Encounter: Payer: Self-pay | Admitting: Internal Medicine

## 2016-11-01 ENCOUNTER — Ambulatory Visit (INDEPENDENT_AMBULATORY_CARE_PROVIDER_SITE_OTHER): Payer: Medicare Other | Admitting: Internal Medicine

## 2016-11-01 DIAGNOSIS — M153 Secondary multiple arthritis: Secondary | ICD-10-CM

## 2016-11-01 DIAGNOSIS — L270 Generalized skin eruption due to drugs and medicaments taken internally: Secondary | ICD-10-CM

## 2016-11-01 DIAGNOSIS — F413 Other mixed anxiety disorders: Secondary | ICD-10-CM | POA: Diagnosis not present

## 2016-11-01 DIAGNOSIS — M1992 Post-traumatic osteoarthritis, unspecified site: Secondary | ICD-10-CM

## 2016-11-01 DIAGNOSIS — E7800 Pure hypercholesterolemia, unspecified: Secondary | ICD-10-CM | POA: Diagnosis not present

## 2016-11-01 DIAGNOSIS — F909 Attention-deficit hyperactivity disorder, unspecified type: Secondary | ICD-10-CM | POA: Diagnosis not present

## 2016-11-01 LAB — COMPREHENSIVE METABOLIC PANEL
ALBUMIN: 4.4 g/dL (ref 3.5–5.2)
ALT: 17 U/L (ref 0–35)
AST: 32 U/L (ref 0–37)
Alkaline Phosphatase: 64 U/L (ref 39–117)
BUN: 16 mg/dL (ref 6–23)
CHLORIDE: 100 meq/L (ref 96–112)
CO2: 31 meq/L (ref 19–32)
CREATININE: 0.82 mg/dL (ref 0.40–1.20)
Calcium: 9.7 mg/dL (ref 8.4–10.5)
GFR: 72.99 mL/min (ref >60.00)
Glucose, Bld: 99 mg/dL (ref 70–99)
Potassium: 3.8 mEq/L (ref 3.5–5.1)
Sodium: 137 mEq/L (ref 135–145)
TOTAL PROTEIN: 7.1 g/dL (ref 6.0–8.3)
Total Bilirubin: 0.5 mg/dL (ref 0.2–1.2)

## 2016-11-01 LAB — LIPID PANEL
CHOL/HDL RATIO: 3
Cholesterol: 241 mg/dL — ABNORMAL HIGH (ref 0–200)
HDL: 69.2 mg/dL (ref >39.00)
LDL Cholesterol: 149 mg/dL — ABNORMAL HIGH (ref 0–99)
NONHDL: 171.42
TRIGLYCERIDES: 113 mg/dL (ref 0.0–149.0)
VLDL: 22.6 mg/dL (ref 0.0–40.0)

## 2016-11-01 MED ORDER — AMPHETAMINE-DEXTROAMPHETAMINE 20 MG PO TABS: 20.0000 mg | ORAL_TABLET | Freq: Two times a day (BID) | ORAL | 0 refills | Status: DC

## 2016-11-01 MED ORDER — AMPHETAMINE-DEXTROAMPHETAMINE 20 MG PO TABS
20.0000 mg | ORAL_TABLET | Freq: Two times a day (BID) | ORAL | 0 refills | Status: DC
Start: 2016-11-01 — End: 2016-11-01

## 2016-11-01 MED ORDER — HYDROCODONE-ACETAMINOPHEN 10-325 MG PO TABS: 1.0000 | ORAL_TABLET | Freq: Every evening | ORAL | 0 refills | Status: DC | PRN

## 2016-11-01 MED ORDER — TRAMADOL HCL 50 MG PO TABS: 100.0000 mg | ORAL_TABLET | Freq: Four times a day (QID) | ORAL | 5 refills | Status: DC | PRN

## 2016-11-01 MED ORDER — TRIAMCINOLONE ACETONIDE 0.1 % EX CREA: 1.0000 "application " | TOPICAL_CREAM | Freq: Two times a day (BID) | CUTANEOUS | 2 refills | Status: AC
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
## 2016-11-01 NOTE — Patient Instructions (Signed)
I am recommending that you increase the tramadol to 2 every 6 to 8 hours if needed or your pain  Triamcinolone ointment  for your rash

## 2016-11-03 DIAGNOSIS — L270 Generalized skin eruption due to drugs and medicaments taken internally: Secondary | ICD-10-CM

## 2016-11-03 DIAGNOSIS — L282 Other prurigo: Secondary | ICD-10-CM | POA: Insufficient documentation

## 2016-11-03 NOTE — Assessment & Plan Note (Signed)
Trial of triamcinolone. 
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
## 2016-11-03 NOTE — Assessment & Plan Note (Signed)
Continue  adderall 20 mg bid.  Refills for 3 months given.  

## 2016-11-03 NOTE — Assessment & Plan Note (Signed)
Manifested primary as insomnia.  Aggravated by suspension of alprazolam and trial of wellbutrin for ADD and trazodone for insomnia.  Resume adderall ,  Stop wellbutrin and continue trazodone

## 2016-11-04 NOTE — Assessment & Plan Note (Signed)
10 yr risk is 20% using the FRC. Atorvastatin trial advised.   Lab Results  Component Value Date   CHOL 241 (H) 11/01/2016   HDL 69.20 11/01/2016   LDLCALC 149 (H) 11/01/2016   LDLDIRECT 167.0 04/30/2016   TRIG 113.0 11/01/2016   CHOLHDL 3 11/01/2016

## 2016-11-05 ENCOUNTER — Emergency Department
Admission: EM | Admit: 2016-11-05 | Discharge: 2016-11-05 | Disposition: A | Discharge status: Home / Self Care | Payer: Medicare Other | Attending: Emergency Medicine | Admitting: Emergency Medicine

## 2016-11-05 ENCOUNTER — Encounter: Payer: Self-pay | Admitting: Internal Medicine

## 2016-11-05 DIAGNOSIS — M5441 Lumbago with sciatica, right side: Secondary | ICD-10-CM | POA: Diagnosis not present

## 2016-11-05 DIAGNOSIS — M5432 Sciatica, left side: Secondary | ICD-10-CM | POA: Diagnosis not present

## 2016-11-05 DIAGNOSIS — L02415 Cutaneous abscess of right lower limb: Secondary | ICD-10-CM

## 2016-11-05 DIAGNOSIS — J449 Chronic obstructive pulmonary disease, unspecified: Secondary | ICD-10-CM | POA: Insufficient documentation

## 2016-11-05 DIAGNOSIS — Z79899 Other long term (current) drug therapy: Secondary | ICD-10-CM | POA: Diagnosis not present

## 2016-11-05 DIAGNOSIS — I10 Essential (primary) hypertension: Secondary | ICD-10-CM | POA: Insufficient documentation

## 2016-11-05 DIAGNOSIS — Z87891 Personal history of nicotine dependence: Secondary | ICD-10-CM | POA: Diagnosis not present

## 2016-11-05 DIAGNOSIS — M5431 Sciatica, right side: Secondary | ICD-10-CM | POA: Diagnosis not present

## 2016-11-05 DIAGNOSIS — M545 Low back pain: Secondary | ICD-10-CM | POA: Diagnosis present

## 2016-11-05 DIAGNOSIS — L03115 Cellulitis of right lower limb: Secondary | ICD-10-CM | POA: Insufficient documentation

## 2016-11-05 DIAGNOSIS — M5442 Lumbago with sciatica, left side: Secondary | ICD-10-CM | POA: Diagnosis not present

## 2016-11-05 MED ORDER — LIDOCAINE HCL (PF) 1 % IJ SOLN
INTRAMUSCULAR | Status: AC
  Filled 2016-11-05: qty 5

## 2016-11-05 MED ORDER — DOXYCYCLINE HYCLATE 100 MG PO CAPS: 100.0000 mg | ORAL_CAPSULE | Freq: Two times a day (BID) | ORAL | 0 refills | Status: AC

## 2016-11-05 MED ORDER — PREDNISONE 10 MG (21) PO TBPK: ORAL_TABLET | ORAL | 0 refills | Status: DC

## 2016-11-05 MED ORDER — TRIAMCINOLONE ACETONIDE 40 MG/ML IJ SUSP
80.0000 mg | Freq: Once | INTRAMUSCULAR | Status: AC
  Administered 2016-11-05: 80 mg via INTRAMUSCULAR
  Filled 2016-11-05: qty 2

## 2016-11-05 MED ORDER — CYCLOBENZAPRINE HCL 10 MG PO TABS: 10.0000 mg | ORAL_TABLET | Freq: Three times a day (TID) | ORAL | 0 refills | Status: DC | PRN

## 2016-11-05 MED ORDER — LIDOCAINE HCL (PF) 1 % IJ SOLN
10.0000 mL | Freq: Once | INTRAMUSCULAR | Status: AC
  Administered 2016-11-05: 10 mL
  Filled 2016-11-05: qty 10
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
## 2016-11-05 NOTE — ED Triage Notes (Signed)
Pt c/o lower back pain that radiates into the right leg for the past  Month. Denies injury

## 2016-11-05 NOTE — Discharge Instructions (Signed)
Take medication as prescribed. Return to emergency department if symptoms worsen and follow-up with PCP as needed.   °

## 2016-11-06 ENCOUNTER — Other Ambulatory Visit: Payer: Self-pay | Admitting: Internal Medicine

## 2016-11-06 DIAGNOSIS — Z79899 Other long term (current) drug therapy: Secondary | ICD-10-CM

## 2016-11-06 MED ORDER — ATORVASTATIN CALCIUM 20 MG PO TABS: 20.0000 mg | ORAL_TABLET | Freq: Every day | ORAL | 0 refills | Status: DC

## 2016-11-06 NOTE — Progress Notes (Signed)
atorvasta

## 2016-11-07 ENCOUNTER — Telehealth: Payer: Self-pay | Admitting: Internal Medicine

## 2016-11-07 NOTE — Telephone Encounter (Signed)
Pt called returning your call. I scheduled pt for her lab appt. I was not sure if you needed to speak with the pt or not. :) Thank you!

## 2016-11-07 NOTE — Telephone Encounter (Signed)
Your Welcome!

## 2016-11-07 NOTE — Telephone Encounter (Signed)
Thank you :)

## 2016-11-08 ENCOUNTER — Other Ambulatory Visit: Payer: Self-pay | Admitting: Internal Medicine

## 2016-11-21 NOTE — Telephone Encounter (Signed)
Left pt message asking to call Ebony Hail back directly at [phone] to schedule AWV. Thanks!  *NOTE* Last AWV 06/06/15

## 2016-11-28 ENCOUNTER — Other Ambulatory Visit (INDEPENDENT_AMBULATORY_CARE_PROVIDER_SITE_OTHER): Payer: Medicare Other

## 2016-11-28 DIAGNOSIS — Z79899 Other long term (current) drug therapy: Secondary | ICD-10-CM | POA: Diagnosis not present

## 2016-11-28 LAB — HEPATIC FUNCTION PANEL
ALT: 16 U/L (ref 0–35)
AST: 25 U/L (ref 0–37)
Albumin: 4.3 g/dL (ref 3.5–5.2)
Alkaline Phosphatase: 69 U/L (ref 39–117)
BILIRUBIN TOTAL: 0.7 mg/dL (ref 0.2–1.2)
Bilirubin, Direct: 0.1 mg/dL (ref 0.0–0.3)
Total Protein: 6.6 g/dL (ref 6.0–8.3)

## 2016-11-30 ENCOUNTER — Encounter: Payer: Self-pay | Admitting: Internal Medicine

## 2016-12-03 ENCOUNTER — Other Ambulatory Visit: Payer: Self-pay | Admitting: Internal Medicine

## 2016-12-03 MED ORDER — ATORVASTATIN CALCIUM 20 MG PO TABS: 20.0000 mg | ORAL_TABLET | Freq: Every day | ORAL | 1 refills | Status: DC

## 2016-12-05 NOTE — Telephone Encounter (Signed)
Error

## 2016-12-10 ENCOUNTER — Other Ambulatory Visit: Payer: Self-pay | Admitting: Internal Medicine

## 2016-12-28 ENCOUNTER — Ambulatory Visit (INDEPENDENT_AMBULATORY_CARE_PROVIDER_SITE_OTHER): Payer: Medicare Other | Admitting: *Deleted

## 2016-12-28 DIAGNOSIS — M810 Age-related osteoporosis without current pathological fracture: Secondary | ICD-10-CM | POA: Diagnosis not present

## 2016-12-28 MED ORDER — DENOSUMAB 60 MG/ML ~~LOC~~ SOLN
60.0000 mg | Freq: Once | SUBCUTANEOUS | Status: AC
  Administered 2016-12-28: 60 mg via SUBCUTANEOUS

## 2016-12-28 NOTE — Progress Notes (Signed)
Patient presented for Prolia injection to left arm given subcutaneously, patient tolerated well.

## 2017-01-03 NOTE — Progress Notes (Signed)
  I have reviewed the above information and agree with above.   Peace Noyes, MD 

## 2017-01-07 ENCOUNTER — Other Ambulatory Visit: Payer: Self-pay | Admitting: Internal Medicine

## 2017-01-07 DIAGNOSIS — K209 Esophagitis, unspecified without bleeding: Secondary | ICD-10-CM

## 2017-01-14 ENCOUNTER — Other Ambulatory Visit: Payer: Self-pay | Admitting: Internal Medicine

## 2017-01-15 NOTE — Telephone Encounter (Signed)
10/24/16 #30 with 2 rf given F/u 02/04/17 O/v 11/01/16

## 2017-01-16 ENCOUNTER — Other Ambulatory Visit: Payer: Self-pay

## 2017-01-16 NOTE — Telephone Encounter (Signed)
Refilled: 10/24/2016 Last OV: 11/01/2016 Next OV: 02/04/2017

## 2017-01-17 MED ORDER — ALPRAZOLAM 0.5 MG PO TABS: 0.5000 mg | ORAL_TABLET | Freq: Every evening | ORAL | 2 refills | Status: DC | PRN

## 2017-01-17 NOTE — Telephone Encounter (Signed)
Refill authorized please fax

## 2017-01-22 DIAGNOSIS — Z961 Presence of intraocular lens: Secondary | ICD-10-CM | POA: Diagnosis not present

## 2017-02-04 ENCOUNTER — Ambulatory Visit: Payer: Medicare Other | Admitting: Internal Medicine

## 2017-02-04 ENCOUNTER — Other Ambulatory Visit: Payer: Self-pay | Admitting: Internal Medicine

## 2017-02-04 DIAGNOSIS — K209 Esophagitis, unspecified without bleeding: Secondary | ICD-10-CM

## 2017-02-07 DIAGNOSIS — R21 Rash and other nonspecific skin eruption: Secondary | ICD-10-CM | POA: Diagnosis not present

## 2017-02-07 DIAGNOSIS — T7840XA Allergy, unspecified, initial encounter: Secondary | ICD-10-CM | POA: Diagnosis not present

## 2017-02-18 ENCOUNTER — Other Ambulatory Visit: Payer: Self-pay | Admitting: Internal Medicine

## 2017-02-22 NOTE — Telephone Encounter (Signed)
Refilled: 07/25/2015 Last OV: 11/01/2016 Next OV: 03/11/2017

## 2017-02-26 DIAGNOSIS — R21 Rash and other nonspecific skin eruption: Secondary | ICD-10-CM | POA: Diagnosis not present

## 2017-03-11 ENCOUNTER — Ambulatory Visit: Payer: Medicare Other | Admitting: Internal Medicine

## 2017-03-11 ENCOUNTER — Telehealth: Payer: Self-pay | Admitting: Internal Medicine

## 2017-03-11 NOTE — Telephone Encounter (Signed)
Copied from [place] [phone]. Topic: Quick Communication - Appointment Cancellation >> Mar 11, 2017  8:47 AM Malena Catholic I, NT wrote: Patient called to cancel appointment scheduled for  Patient  HAS HAS  rescheduled their appointment. 04/12/17    Route to department's PEC pool.

## 2017-04-12 ENCOUNTER — Encounter: Payer: Self-pay | Admitting: Internal Medicine

## 2017-04-12 ENCOUNTER — Ambulatory Visit (INDEPENDENT_AMBULATORY_CARE_PROVIDER_SITE_OTHER): Payer: Medicare HMO | Admitting: Internal Medicine

## 2017-04-12 VITALS — BP 138/88 | HR 93 | Temp 98.1°F | Resp 18 | Wt 114.0 lb

## 2017-04-12 DIAGNOSIS — M153 Secondary multiple arthritis: Secondary | ICD-10-CM

## 2017-04-12 DIAGNOSIS — E7800 Pure hypercholesterolemia, unspecified: Secondary | ICD-10-CM

## 2017-04-12 DIAGNOSIS — F909 Attention-deficit hyperactivity disorder, unspecified type: Secondary | ICD-10-CM

## 2017-04-12 DIAGNOSIS — L509 Urticaria, unspecified: Secondary | ICD-10-CM | POA: Diagnosis not present

## 2017-04-12 DIAGNOSIS — L270 Generalized skin eruption due to drugs and medicaments taken internally: Secondary | ICD-10-CM

## 2017-04-12 DIAGNOSIS — R69 Illness, unspecified: Secondary | ICD-10-CM | POA: Diagnosis not present

## 2017-04-12 DIAGNOSIS — F413 Other mixed anxiety disorders: Secondary | ICD-10-CM

## 2017-04-12 DIAGNOSIS — M810 Age-related osteoporosis without current pathological fracture: Secondary | ICD-10-CM

## 2017-04-12 LAB — COMPREHENSIVE METABOLIC PANEL
ALK PHOS: 50 U/L (ref 39–117)
ALT: 15 U/L (ref 0–35)
AST: 23 U/L (ref 0–37)
Albumin: 4.1 g/dL (ref 3.5–5.2)
BILIRUBIN TOTAL: 0.5 mg/dL (ref 0.2–1.2)
BUN: 22 mg/dL (ref 6–23)
CO2: 28 meq/L (ref 19–32)
Calcium: 9 mg/dL (ref 8.4–10.5)
Chloride: 101 mEq/L (ref 96–112)
Creatinine, Ser: 0.96 mg/dL (ref 0.40–1.20)
GFR: 60.77 mL/min (ref >60.00)
GLUCOSE: 83 mg/dL (ref 70–99)
POTASSIUM: 3.6 meq/L (ref 3.5–5.1)
Sodium: 136 mEq/L (ref 135–145)
Total Protein: 6.8 g/dL (ref 6.0–8.3)

## 2017-04-12 LAB — CBC WITH DIFFERENTIAL/PLATELET
BASOS ABS: 0.1 10*3/uL (ref 0.0–0.1)
BASOS PCT: 0.8 % (ref 0.0–3.0)
EOS PCT: 5.3 % — AB (ref 0.0–5.0)
Eosinophils Absolute: 0.5 10*3/uL (ref 0.0–0.7)
HEMATOCRIT: 38.1 % (ref 36.0–46.0)
Hemoglobin: 12.8 g/dL (ref 12.0–15.0)
LYMPHS ABS: 1.3 10*3/uL (ref 0.7–4.0)
LYMPHS PCT: 14.1 % (ref 12.0–46.0)
MCHC: 33.7 g/dL (ref 30.0–36.0)
MCV: 90.2 fl (ref 78.0–100.0)
MONOS PCT: 7.9 % (ref 3.0–12.0)
Monocytes Absolute: 0.8 10*3/uL (ref 0.1–1.0)
NEUTROS ABS: 6.9 10*3/uL (ref 1.4–7.7)
NEUTROS PCT: 71.9 % (ref 43.0–77.0)
PLATELETS: 278 10*3/uL (ref 150.0–400.0)
RBC: 4.23 Mil/uL (ref 3.87–5.11)
RDW: 13.8 % (ref 11.5–15.5)
WBC: 9.6 10*3/uL (ref 4.0–10.5)

## 2017-04-12 MED ORDER — AMPHETAMINE-DEXTROAMPHETAMINE 20 MG PO TABS: 20.0000 mg | ORAL_TABLET | Freq: Two times a day (BID) | ORAL | 0 refills | Status: DC

## 2017-04-12 MED ORDER — HYDROCODONE-ACETAMINOPHEN 10-325 MG PO TABS: 1.0000 | ORAL_TABLET | Freq: Every evening | ORAL | 0 refills | Status: DC | PRN

## 2017-04-12 MED ORDER — TRAMADOL HCL 50 MG PO TABS: 100.0000 mg | ORAL_TABLET | Freq: Four times a day (QID) | ORAL | 5 refills | Status: DC | PRN

## 2017-04-12 MED ORDER — MONTELUKAST SODIUM 10 MG PO TABS: 10.0000 mg | ORAL_TABLET | Freq: Every day | ORAL | 3 refills | Status: DC

## 2017-04-12 MED ORDER — ALPRAZOLAM 0.5 MG PO TABS: 0.5000 mg | ORAL_TABLET | Freq: Every evening | ORAL | 5 refills | Status: DC | PRN

## 2017-04-12 NOTE — Patient Instructions (Addendum)
Try substituting zyrtec for benadryl   Once daily  Adding  Singulair  Once daily    go back to [facility] for a skin biopsy
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
## 2017-04-14 MED ORDER — FAMOTIDINE 20 MG PO TABS: 20.0000 mg | ORAL_TABLET | Freq: Two times a day (BID) | ORAL | 2 refills | Status: DC

## 2017-04-14 NOTE — Assessment & Plan Note (Addendum)
Offending agent unclear since rash has intermittently cleared.  Could be atorvastatin,  Amlodipine or pantoprazole.  Will stop all three and follow.  CBC notes mild eosinophilia and normal  complement levels

## 2017-04-14 NOTE — Assessment & Plan Note (Signed)
Manifested primary as insomnia.  Aggravated by suspension of alprazolam and trial of wellbutrin for ADD and trazodone for insomnia.  Improved on regiemn of adderall ,   trazodone and alprazolam . Refills given. The risks and benefits of benzodiazepine use were reviewed with patient today including excessive sedation leading to respiratory depression,  impaired thinking/driving, and addiction.  Patient was advised to avoid concurrent use with alcohol, to use medication only as needed and not to share with others  .

## 2017-04-14 NOTE — Assessment & Plan Note (Signed)
Continue  adderall 20 mg bid.  Refills for 3 months given.  

## 2017-04-14 NOTE — Assessment & Plan Note (Signed)
Suspending statin due to new onset persistent rash of unclear etiology

## 2017-04-14 NOTE — Assessment & Plan Note (Signed)
Now managed with Prolia due to adverse reaction to alendronate
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
## 2017-04-15 ENCOUNTER — Telehealth: Payer: Self-pay | Admitting: *Deleted

## 2017-04-15 LAB — COMPLEMENT, TOTAL

## 2017-04-15 LAB — C3 AND C4
C3 Complement: 124 mg/dL (ref 83–193)
C4 Complement: 28 mg/dL (ref 15–57)

## 2017-04-15 NOTE — Telephone Encounter (Signed)
Copied from [place] [id number]. Topic: Quick Communication - Office Called Patient >> Apr 15, 2017 11:13 AM Cecelia Byars, NT wrote: Reason for CRM:  Patient called and says there  was no message left , please call again

## 2017-04-17 NOTE — Telephone Encounter (Signed)
She hasn't been off of them long enough. Needs to stay off them for 2 weeks.  Can take allegra (fexofenadine)  180 mg up to 4 daily

## 2017-04-17 NOTE — Telephone Encounter (Signed)
Copied from [place] [phone]. Topic: Quick Communication - Office Called Patient >> Apr 17, 2017  9:28 AM Percell Belt A wrote: Pt called in again and stated that she has stopped all the meds that she was told to stop but she is still itching badly .  She said that she needs something for the itching or know what she can take  Best number [phone]  Stopped amlodipine , pantoprazole, atorvastatin,  lov 04/12/2017

## 2017-04-17 NOTE — Telephone Encounter (Signed)
Please advise 

## 2017-04-17 NOTE — Telephone Encounter (Signed)
Patient advised of below , clarified with Dr Derrel Nip dose of allergra. she will gradually taper up dose of allegra 180 up to 4 pills daily. Patient verbalized understanding.

## 2017-04-19 ENCOUNTER — Other Ambulatory Visit: Payer: Self-pay | Admitting: Internal Medicine

## 2017-04-25 DIAGNOSIS — R21 Rash and other nonspecific skin eruption: Secondary | ICD-10-CM | POA: Diagnosis not present

## 2017-04-25 DIAGNOSIS — T7840XA Allergy, unspecified, initial encounter: Secondary | ICD-10-CM | POA: Diagnosis not present

## 2017-04-27 DIAGNOSIS — R69 Illness, unspecified: Secondary | ICD-10-CM | POA: Diagnosis not present

## 2017-04-28 ENCOUNTER — Encounter: Payer: Self-pay | Admitting: Internal Medicine

## 2017-05-02 ENCOUNTER — Other Ambulatory Visit: Payer: Self-pay | Admitting: Internal Medicine

## 2017-05-02 NOTE — Telephone Encounter (Signed)
Refilled: 02/22/2017 Last OV: 04/12/2017 Next OV: 07/10/2017

## 2017-05-09 DIAGNOSIS — R69 Illness, unspecified: Secondary | ICD-10-CM | POA: Diagnosis not present

## 2017-05-09 DIAGNOSIS — L309 Dermatitis, unspecified: Secondary | ICD-10-CM | POA: Diagnosis not present

## 2017-05-09 DIAGNOSIS — Z880 Allergy status to penicillin: Secondary | ICD-10-CM | POA: Diagnosis not present

## 2017-05-09 DIAGNOSIS — L270 Generalized skin eruption due to drugs and medicaments taken internally: Secondary | ICD-10-CM | POA: Diagnosis not present

## 2017-05-13 DIAGNOSIS — L2089 Other atopic dermatitis: Secondary | ICD-10-CM | POA: Diagnosis not present

## 2017-05-21 DIAGNOSIS — L2089 Other atopic dermatitis: Secondary | ICD-10-CM | POA: Diagnosis not present

## 2017-05-22 ENCOUNTER — Telehealth: Payer: Self-pay

## 2017-05-23 DIAGNOSIS — M9902 Segmental and somatic dysfunction of thoracic region: Secondary | ICD-10-CM | POA: Diagnosis not present

## 2017-05-23 DIAGNOSIS — M9901 Segmental and somatic dysfunction of cervical region: Secondary | ICD-10-CM | POA: Diagnosis not present

## 2017-05-23 DIAGNOSIS — M9904 Segmental and somatic dysfunction of sacral region: Secondary | ICD-10-CM | POA: Diagnosis not present

## 2017-05-23 DIAGNOSIS — M461 Sacroiliitis, not elsewhere classified: Secondary | ICD-10-CM | POA: Diagnosis not present

## 2017-05-23 DIAGNOSIS — M546 Pain in thoracic spine: Secondary | ICD-10-CM | POA: Diagnosis not present

## 2017-05-23 DIAGNOSIS — M542 Cervicalgia: Secondary | ICD-10-CM | POA: Diagnosis not present

## 2017-05-23 DIAGNOSIS — M9903 Segmental and somatic dysfunction of lumbar region: Secondary | ICD-10-CM | POA: Diagnosis not present

## 2017-05-23 DIAGNOSIS — M545 Low back pain: Secondary | ICD-10-CM | POA: Diagnosis not present

## 2017-05-27 DIAGNOSIS — M9901 Segmental and somatic dysfunction of cervical region: Secondary | ICD-10-CM | POA: Diagnosis not present

## 2017-05-27 DIAGNOSIS — M542 Cervicalgia: Secondary | ICD-10-CM | POA: Diagnosis not present

## 2017-05-27 DIAGNOSIS — M461 Sacroiliitis, not elsewhere classified: Secondary | ICD-10-CM | POA: Diagnosis not present

## 2017-05-27 DIAGNOSIS — M545 Low back pain: Secondary | ICD-10-CM | POA: Diagnosis not present

## 2017-05-27 DIAGNOSIS — M9902 Segmental and somatic dysfunction of thoracic region: Secondary | ICD-10-CM | POA: Diagnosis not present

## 2017-05-27 DIAGNOSIS — M9903 Segmental and somatic dysfunction of lumbar region: Secondary | ICD-10-CM | POA: Diagnosis not present

## 2017-05-27 DIAGNOSIS — M9904 Segmental and somatic dysfunction of sacral region: Secondary | ICD-10-CM | POA: Diagnosis not present

## 2017-05-27 DIAGNOSIS — M546 Pain in thoracic spine: Secondary | ICD-10-CM | POA: Diagnosis not present

## 2017-05-28 ENCOUNTER — Other Ambulatory Visit: Payer: Self-pay | Admitting: Internal Medicine

## 2017-05-28 NOTE — Telephone Encounter (Signed)
Error

## 2017-05-29 DIAGNOSIS — M461 Sacroiliitis, not elsewhere classified: Secondary | ICD-10-CM | POA: Diagnosis not present

## 2017-05-29 DIAGNOSIS — M9902 Segmental and somatic dysfunction of thoracic region: Secondary | ICD-10-CM | POA: Diagnosis not present

## 2017-05-29 DIAGNOSIS — M542 Cervicalgia: Secondary | ICD-10-CM | POA: Diagnosis not present

## 2017-05-29 DIAGNOSIS — M9901 Segmental and somatic dysfunction of cervical region: Secondary | ICD-10-CM | POA: Diagnosis not present

## 2017-05-29 DIAGNOSIS — M9903 Segmental and somatic dysfunction of lumbar region: Secondary | ICD-10-CM | POA: Diagnosis not present

## 2017-05-29 DIAGNOSIS — M9904 Segmental and somatic dysfunction of sacral region: Secondary | ICD-10-CM | POA: Diagnosis not present

## 2017-05-29 DIAGNOSIS — M546 Pain in thoracic spine: Secondary | ICD-10-CM | POA: Diagnosis not present

## 2017-05-29 DIAGNOSIS — M545 Low back pain: Secondary | ICD-10-CM | POA: Diagnosis not present

## 2017-05-29 NOTE — Telephone Encounter (Signed)
Refilled: 05/05/2017 Last OV: 04/12/2017 Next OV: 07/10/2017

## 2017-05-31 DIAGNOSIS — M9902 Segmental and somatic dysfunction of thoracic region: Secondary | ICD-10-CM | POA: Diagnosis not present

## 2017-05-31 DIAGNOSIS — M546 Pain in thoracic spine: Secondary | ICD-10-CM | POA: Diagnosis not present

## 2017-05-31 DIAGNOSIS — M545 Low back pain: Secondary | ICD-10-CM | POA: Diagnosis not present

## 2017-05-31 DIAGNOSIS — M461 Sacroiliitis, not elsewhere classified: Secondary | ICD-10-CM | POA: Diagnosis not present

## 2017-05-31 DIAGNOSIS — M9901 Segmental and somatic dysfunction of cervical region: Secondary | ICD-10-CM | POA: Diagnosis not present

## 2017-05-31 DIAGNOSIS — M9904 Segmental and somatic dysfunction of sacral region: Secondary | ICD-10-CM | POA: Diagnosis not present

## 2017-05-31 DIAGNOSIS — M542 Cervicalgia: Secondary | ICD-10-CM | POA: Diagnosis not present

## 2017-05-31 DIAGNOSIS — M9903 Segmental and somatic dysfunction of lumbar region: Secondary | ICD-10-CM | POA: Diagnosis not present

## 2017-06-05 DIAGNOSIS — L2089 Other atopic dermatitis: Secondary | ICD-10-CM | POA: Diagnosis not present

## 2017-06-11 ENCOUNTER — Telehealth: Payer: Self-pay | Admitting: Internal Medicine

## 2017-06-11 NOTE — Telephone Encounter (Signed)
Insurance verification for Prolia filed on [employer] Portal. 

## 2017-06-20 ENCOUNTER — Other Ambulatory Visit: Payer: Self-pay | Admitting: Internal Medicine

## 2017-06-24 ENCOUNTER — Other Ambulatory Visit: Payer: Self-pay | Admitting: Internal Medicine

## 2017-06-25 ENCOUNTER — Other Ambulatory Visit: Payer: Self-pay | Admitting: Internal Medicine

## 2017-07-03 ENCOUNTER — Ambulatory Visit (INDEPENDENT_AMBULATORY_CARE_PROVIDER_SITE_OTHER): Payer: Medicare HMO | Admitting: *Deleted

## 2017-07-03 DIAGNOSIS — M818 Other osteoporosis without current pathological fracture: Secondary | ICD-10-CM

## 2017-07-03 DIAGNOSIS — M810 Age-related osteoporosis without current pathological fracture: Secondary | ICD-10-CM | POA: Diagnosis not present

## 2017-07-03 MED ORDER — DENOSUMAB 60 MG/ML ~~LOC~~ SOSY
60.0000 mg | PREFILLED_SYRINGE | Freq: Once | SUBCUTANEOUS | Status: AC
  Administered 2017-07-03: 60 mg via SUBCUTANEOUS

## 2017-07-03 NOTE — Progress Notes (Signed)
Patient presented for Prolia injection to Right arm [place], patient voiced no concerns or complaints during or after injection. 

## 2017-07-10 ENCOUNTER — Ambulatory Visit (INDEPENDENT_AMBULATORY_CARE_PROVIDER_SITE_OTHER): Payer: Medicare HMO | Admitting: Internal Medicine

## 2017-07-10 ENCOUNTER — Encounter: Payer: Self-pay | Admitting: Internal Medicine

## 2017-07-10 DIAGNOSIS — F413 Other mixed anxiety disorders: Secondary | ICD-10-CM

## 2017-07-10 DIAGNOSIS — I10 Essential (primary) hypertension: Secondary | ICD-10-CM | POA: Diagnosis not present

## 2017-07-10 DIAGNOSIS — F909 Attention-deficit hyperactivity disorder, unspecified type: Secondary | ICD-10-CM

## 2017-07-10 DIAGNOSIS — R5383 Other fatigue: Secondary | ICD-10-CM

## 2017-07-10 DIAGNOSIS — M153 Secondary multiple arthritis: Secondary | ICD-10-CM | POA: Diagnosis not present

## 2017-07-10 DIAGNOSIS — L270 Generalized skin eruption due to drugs and medicaments taken internally: Secondary | ICD-10-CM

## 2017-07-10 DIAGNOSIS — R69 Illness, unspecified: Secondary | ICD-10-CM | POA: Diagnosis not present

## 2017-07-10 DIAGNOSIS — E7800 Pure hypercholesterolemia, unspecified: Secondary | ICD-10-CM

## 2017-07-10 MED ORDER — AMPHETAMINE-DEXTROAMPHETAMINE 20 MG PO TABS: 20.0000 mg | ORAL_TABLET | Freq: Two times a day (BID) | ORAL | 0 refills | Status: DC

## 2017-07-10 MED ORDER — HYDROCODONE-ACETAMINOPHEN 10-325 MG PO TABS: 1.0000 | ORAL_TABLET | Freq: Every evening | ORAL | 0 refills | Status: DC | PRN

## 2017-07-10 MED ORDER — MIRTAZAPINE 15 MG PO TABS: ORAL_TABLET | ORAL | 2 refills | Status: DC

## 2017-07-10 NOTE — Patient Instructions (Signed)
I recommend a trial of Remeron (mirtazipine), an antidepressant,  That helps with appetite,  Sleep and energy (if we can gradually increase the dose to 30 mg )  Start with 1/2 tablet for the first 2 weeks,  Increase to full tablet after  2 weeks , then let me know how you are felling
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
## 2017-07-11 MED ORDER — ATORVASTATIN CALCIUM 20 MG PO TABS: 20.0000 mg | ORAL_TABLET | Freq: Every day | ORAL | 1 refills | Status: DC

## 2017-07-11 MED ORDER — AMLODIPINE BESYLATE 5 MG PO TABS: 5.0000 mg | ORAL_TABLET | Freq: Every day | ORAL | 5 refills | Status: DC

## 2017-07-11 NOTE — Assessment & Plan Note (Signed)
Advised to resume statin due to 10 yr risk of CAD at 20%
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
## 2017-07-11 NOTE — Assessment & Plan Note (Signed)
Secondary to insomnia, anxiety.  Trial of remeron to address both

## 2017-07-11 NOTE — Assessment & Plan Note (Signed)
Resume amlodipine. 

## 2017-07-11 NOTE — Assessment & Plan Note (Signed)
No change with suspension of atorvastatin, amlodipine and pantoprazole.  She has resumed pantoprazole.  But not the other medications.

## 2017-07-11 NOTE — Assessment & Plan Note (Signed)
Continue  adderall 20 mg bid.  Refills for 3 months given.  

## 2017-07-11 NOTE — Assessment & Plan Note (Signed)
Manifested primary as insomnia, anorexia, and fatigue . Aggravated by recent financial and emotional stressors.   adding remeron at bedtime

## 2017-07-17 DIAGNOSIS — R69 Illness, unspecified: Secondary | ICD-10-CM | POA: Diagnosis not present

## 2017-07-29 ENCOUNTER — Other Ambulatory Visit: Payer: Self-pay | Admitting: Internal Medicine

## 2017-07-30 DIAGNOSIS — R69 Illness, unspecified: Secondary | ICD-10-CM | POA: Diagnosis not present

## 2017-08-01 DIAGNOSIS — L2089 Other atopic dermatitis: Secondary | ICD-10-CM | POA: Diagnosis not present

## 2017-08-09 ENCOUNTER — Other Ambulatory Visit: Payer: Self-pay | Admitting: Internal Medicine

## 2017-08-12 ENCOUNTER — Other Ambulatory Visit: Payer: Self-pay | Admitting: Internal Medicine

## 2017-08-12 DIAGNOSIS — R69 Illness, unspecified: Secondary | ICD-10-CM | POA: Diagnosis not present

## 2017-08-13 ENCOUNTER — Other Ambulatory Visit: Payer: Self-pay | Admitting: Internal Medicine

## 2017-09-03 DIAGNOSIS — L2089 Other atopic dermatitis: Secondary | ICD-10-CM | POA: Diagnosis not present

## 2017-09-03 DIAGNOSIS — L603 Nail dystrophy: Secondary | ICD-10-CM | POA: Diagnosis not present

## 2017-09-03 LAB — BASIC METABOLIC PANEL
BUN: 14 (ref 4–21)
Creatinine: 0.9 (ref 0.5–1.1)
Glucose: 99
Potassium: 3.5 (ref 3.4–5.3)
Sodium: 139 (ref 137–147)

## 2017-09-03 LAB — HEPATIC FUNCTION PANEL
ALK PHOS: 61 (ref 25–125)
ALT: 24 (ref 7–35)
AST: 36 — AB (ref 13–35)
BILIRUBIN DIRECT: 0.07 (ref 0.01–0.4)
BILIRUBIN, TOTAL: 0.2

## 2017-09-03 LAB — CBC AND DIFFERENTIAL
HCT: 37 (ref 36–46)
Hemoglobin: 12.9 (ref 12.0–16.0)
NEUTROS ABS: 5
PLATELETS: 273 (ref 150–399)
WBC: 7.6

## 2017-09-03 LAB — VITAMIN D 25 HYDROXY (VIT D DEFICIENCY, FRACTURES): Vit D, 25-Hydroxy: 32.8

## 2017-09-22 ENCOUNTER — Telehealth: Payer: Self-pay | Admitting: Internal Medicine

## 2017-09-24 ENCOUNTER — Other Ambulatory Visit: Payer: Self-pay

## 2017-10-10 ENCOUNTER — Other Ambulatory Visit: Payer: Self-pay | Admitting: Internal Medicine

## 2017-10-11 NOTE — Telephone Encounter (Signed)
LMTCB. Need to find out if pt has enough Alprazolam to get her through until Monday when Dr. Derrel Nip returns to the office. P[facility] may speak with pt.

## 2017-10-14 ENCOUNTER — Ambulatory Visit (INDEPENDENT_AMBULATORY_CARE_PROVIDER_SITE_OTHER): Payer: Medicare HMO | Admitting: Internal Medicine

## 2017-10-14 ENCOUNTER — Encounter: Payer: Self-pay | Admitting: Internal Medicine

## 2017-10-14 VITALS — BP 124/78 | HR 101 | Temp 98.9°F | Resp 16 | Ht 63.0 in | Wt 103.8 lb

## 2017-10-14 DIAGNOSIS — R636 Underweight: Secondary | ICD-10-CM

## 2017-10-14 DIAGNOSIS — D126 Benign neoplasm of colon, unspecified: Secondary | ICD-10-CM

## 2017-10-14 DIAGNOSIS — F909 Attention-deficit hyperactivity disorder, unspecified type: Secondary | ICD-10-CM | POA: Diagnosis not present

## 2017-10-14 DIAGNOSIS — Z1231 Encounter for screening mammogram for malignant neoplasm of breast: Secondary | ICD-10-CM

## 2017-10-14 DIAGNOSIS — M153 Secondary multiple arthritis: Secondary | ICD-10-CM

## 2017-10-14 DIAGNOSIS — R69 Illness, unspecified: Secondary | ICD-10-CM | POA: Diagnosis not present

## 2017-10-14 DIAGNOSIS — Z1239 Encounter for other screening for malignant neoplasm of breast: Secondary | ICD-10-CM

## 2017-10-14 DIAGNOSIS — L270 Generalized skin eruption due to drugs and medicaments taken internally: Secondary | ICD-10-CM

## 2017-10-14 DIAGNOSIS — Z0000 Encounter for general adult medical examination without abnormal findings: Secondary | ICD-10-CM | POA: Diagnosis not present

## 2017-10-14 MED ORDER — TRAMADOL HCL 50 MG PO TABS: 100.0000 mg | ORAL_TABLET | Freq: Four times a day (QID) | ORAL | 5 refills | Status: DC | PRN

## 2017-10-14 MED ORDER — AMPHETAMINE-DEXTROAMPHETAMINE 20 MG PO TABS: 20.0000 mg | ORAL_TABLET | Freq: Two times a day (BID) | ORAL | 0 refills | Status: DC

## 2017-10-14 MED ORDER — HYDROCODONE-ACETAMINOPHEN 10-325 MG PO TABS: 1.0000 | ORAL_TABLET | Freq: Every evening | ORAL | 0 refills | Status: DC | PRN
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
## 2017-10-14 NOTE — Telephone Encounter (Signed)
refiled

## 2017-10-14 NOTE — Telephone Encounter (Signed)
Refilled: 04/12/2017 Last OV: 07/10/2017 Next OV: 10/14/2017

## 2017-10-15 NOTE — Assessment & Plan Note (Signed)
Persistent despite trials of suspending suspected Offending agents.  Advised to increase antihistamine dose to up to 4 x normal 9the recommended treatment for chronic urticaria).  PREVIOUS CBC noted mild eosinophilia and normal  complement levels

## 2017-10-15 NOTE — Assessment & Plan Note (Signed)
Continue  adderall 20 mg bid.  Refills for 3 months given.  

## 2017-10-15 NOTE — Assessment & Plan Note (Signed)
Found on 2016 colonoscopy (diagnostic)  Repeat due this winter.

## 2017-10-15 NOTE — Assessment & Plan Note (Signed)
Annual comprehensive preventive exam was done as well as an evaluation and management of chronic conditions .  During the course of the visit the patient was educated and counseled about appropriate screening and preventive services including :  diabetes screening, lipid analysis with projected  10 year  risk for CAD , nutrition counseling, breast, cervical and colorectal cancer screening, and recommended immunizations.  Printed recommendations for health maintenance screenings was given 
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
## 2017-10-15 NOTE — Assessment & Plan Note (Signed)
Etiology unclear,  Will bring screenings up to date and increase remeron dose.

## 2017-10-18 ENCOUNTER — Other Ambulatory Visit: Payer: Self-pay | Admitting: Internal Medicine

## 2017-11-07 DIAGNOSIS — L2089 Other atopic dermatitis: Secondary | ICD-10-CM | POA: Diagnosis not present

## 2017-12-06 ENCOUNTER — Encounter: Payer: Self-pay | Admitting: Internal Medicine

## 2017-12-09 ENCOUNTER — Telehealth: Payer: Self-pay | Admitting: Internal Medicine

## 2017-12-09 NOTE — Telephone Encounter (Signed)
Insurance verification for Prolia filed on [employer] Portal. 

## 2017-12-11 NOTE — Telephone Encounter (Signed)
Patient scheduled for Prolia injection 01/15/18

## 2017-12-13 DIAGNOSIS — R69 Illness, unspecified: Secondary | ICD-10-CM | POA: Diagnosis not present

## 2017-12-19 ENCOUNTER — Other Ambulatory Visit: Payer: Self-pay | Admitting: Internal Medicine

## 2018-01-01 ENCOUNTER — Other Ambulatory Visit: Payer: Self-pay | Admitting: Internal Medicine

## 2018-01-01 ENCOUNTER — Encounter: Payer: Self-pay | Admitting: Internal Medicine

## 2018-01-01 MED ORDER — PREDNISONE 10 MG (21) PO TBPK: ORAL_TABLET | ORAL | 0 refills | Status: DC

## 2018-01-07 DIAGNOSIS — R69 Illness, unspecified: Secondary | ICD-10-CM | POA: Diagnosis not present

## 2018-01-09 ENCOUNTER — Other Ambulatory Visit: Payer: Self-pay | Admitting: Internal Medicine

## 2018-01-15 ENCOUNTER — Ambulatory Visit: Payer: Medicare HMO | Admitting: Internal Medicine

## 2018-01-17 ENCOUNTER — Other Ambulatory Visit: Payer: Self-pay | Admitting: Internal Medicine

## 2018-01-17 DIAGNOSIS — R69 Illness, unspecified: Secondary | ICD-10-CM | POA: Diagnosis not present

## 2018-01-27 DIAGNOSIS — R69 Illness, unspecified: Secondary | ICD-10-CM | POA: Diagnosis not present

## 2018-02-03 ENCOUNTER — Other Ambulatory Visit: Payer: Self-pay | Admitting: Internal Medicine

## 2018-02-07 ENCOUNTER — Other Ambulatory Visit: Payer: Self-pay | Admitting: Internal Medicine

## 2018-02-10 DIAGNOSIS — R69 Illness, unspecified: Secondary | ICD-10-CM | POA: Diagnosis not present

## 2018-02-11 ENCOUNTER — Ambulatory Visit
Admission: RE | Admit: 2018-02-11 | Discharge: 2018-02-11 | Disposition: A | Discharge status: Home / Self Care | Payer: Medicare HMO | Source: Ambulatory Visit | Attending: Internal Medicine | Admitting: Internal Medicine

## 2018-02-11 DIAGNOSIS — Z1231 Encounter for screening mammogram for malignant neoplasm of breast: Secondary | ICD-10-CM | POA: Diagnosis not present

## 2018-02-11 DIAGNOSIS — Z1239 Encounter for other screening for malignant neoplasm of breast: Secondary | ICD-10-CM

## 2018-02-13 ENCOUNTER — Encounter: Payer: Medicare HMO | Admitting: Internal Medicine

## 2018-03-17 ENCOUNTER — Other Ambulatory Visit: Payer: Self-pay | Admitting: Internal Medicine

## 2018-04-01 ENCOUNTER — Ambulatory Visit (INDEPENDENT_AMBULATORY_CARE_PROVIDER_SITE_OTHER): Payer: Medicare HMO | Admitting: Internal Medicine

## 2018-04-01 ENCOUNTER — Encounter: Payer: Self-pay | Admitting: Internal Medicine

## 2018-04-01 VITALS — BP 120/78 | HR 92 | Temp 98.2°F | Resp 16 | Ht 63.0 in | Wt 105.4 lb

## 2018-04-01 DIAGNOSIS — F909 Attention-deficit hyperactivity disorder, unspecified type: Secondary | ICD-10-CM | POA: Diagnosis not present

## 2018-04-01 DIAGNOSIS — D126 Benign neoplasm of colon, unspecified: Secondary | ICD-10-CM

## 2018-04-01 DIAGNOSIS — L270 Generalized skin eruption due to drugs and medicaments taken internally: Secondary | ICD-10-CM

## 2018-04-01 DIAGNOSIS — R7989 Other specified abnormal findings of blood chemistry: Secondary | ICD-10-CM | POA: Diagnosis not present

## 2018-04-01 DIAGNOSIS — E7800 Pure hypercholesterolemia, unspecified: Secondary | ICD-10-CM | POA: Diagnosis not present

## 2018-04-01 DIAGNOSIS — R69 Illness, unspecified: Secondary | ICD-10-CM | POA: Diagnosis not present

## 2018-04-01 DIAGNOSIS — I10 Essential (primary) hypertension: Secondary | ICD-10-CM | POA: Diagnosis not present

## 2018-04-01 DIAGNOSIS — M153 Secondary multiple arthritis: Secondary | ICD-10-CM | POA: Diagnosis not present

## 2018-04-01 MED ORDER — LEVOFLOXACIN 500 MG PO TABS: 500.0000 mg | ORAL_TABLET | Freq: Every day | ORAL | 0 refills | Status: DC

## 2018-04-01 MED ORDER — HYDROXYZINE HCL 50 MG PO TABS: 50.0000 mg | ORAL_TABLET | Freq: Three times a day (TID) | ORAL | 3 refills | Status: DC | PRN

## 2018-04-01 MED ORDER — HYDROCODONE-ACETAMINOPHEN 10-325 MG PO TABS: 1.0000 | ORAL_TABLET | Freq: Every evening | ORAL | 0 refills | Status: DC | PRN

## 2018-04-01 MED ORDER — ALPRAZOLAM 0.5 MG PO TABS: ORAL_TABLET | ORAL | 5 refills | Status: DC

## 2018-04-01 MED ORDER — DOXYCYCLINE HYCLATE 100 MG PO TABS: 100.0000 mg | ORAL_TABLET | Freq: Two times a day (BID) | ORAL | 0 refills | Status: DC

## 2018-04-01 MED ORDER — AMPHETAMINE-DEXTROAMPHETAMINE 20 MG PO TABS: 20.0000 mg | ORAL_TABLET | Freq: Two times a day (BID) | ORAL | 0 refills | Status: DC
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
## 2018-04-01 NOTE — Patient Instructions (Addendum)
I am prescribing hydroxyzine to use every 8 hours to control the itching  Continue once daily allegra as well   Doxycycline twice daily and prednisone taper for the cellulitis and inflammation  Levaquin once daily for the ear infection  Please take a probiotic ( Align, Floraque or Culturelle), the generic version of one of these over the counter medications, or an alternative form (kombucha,  Yogurt, or another dietary source) for a minimum of 3 weeks to prevent a serious antibiotic associated diarrhea  Called clostridium dificile colitis.  Taking a probiotic may also prevent vaginitis due to yeast infections and can be continued indefinitely if you feel that it improves your digestion or your elimination (bowels).     Please schedule an office visit in one month for a memory test  I have refilled your Adderall ,  Xanax and Vicodin for one month

## 2018-04-02 ENCOUNTER — Telehealth: Payer: Self-pay | Admitting: Internal Medicine

## 2018-04-02 LAB — CBC WITH DIFFERENTIAL/PLATELET
BASOS ABS: 0.1 10*3/uL (ref 0.0–0.1)
Basophils Relative: 0.9 % (ref 0.0–3.0)
Eosinophils Absolute: 0.5 10*3/uL (ref 0.0–0.7)
Eosinophils Relative: 6.5 % — ABNORMAL HIGH (ref 0.0–5.0)
HCT: 40.2 % (ref 36.0–46.0)
Hemoglobin: 13.4 g/dL (ref 12.0–15.0)
Lymphocytes Relative: 13.4 % (ref 12.0–46.0)
Lymphs Abs: 1 10*3/uL (ref 0.7–4.0)
MCHC: 33.2 g/dL (ref 30.0–36.0)
MCV: 91.4 fl (ref 78.0–100.0)
MONOS PCT: 8.7 % (ref 3.0–12.0)
Monocytes Absolute: 0.7 10*3/uL (ref 0.1–1.0)
NEUTROS ABS: 5.4 10*3/uL (ref 1.4–7.7)
NEUTROS PCT: 70.5 % (ref 43.0–77.0)
PLATELETS: 339 10*3/uL (ref 150.0–400.0)
RBC: 4.4 Mil/uL (ref 3.87–5.11)
RDW: 13.3 % (ref 11.5–15.5)
WBC: 7.7 10*3/uL (ref 4.0–10.5)

## 2018-04-02 LAB — COMPREHENSIVE METABOLIC PANEL
ALT: 14 U/L (ref 0–35)
AST: 23 U/L (ref 0–37)
Albumin: 4.3 g/dL (ref 3.5–5.2)
Alkaline Phosphatase: 85 U/L (ref 39–117)
BUN: 19 mg/dL (ref 6–23)
CO2: 28 meq/L (ref 19–32)
Calcium: 10.1 mg/dL (ref 8.4–10.5)
Chloride: 101 mEq/L (ref 96–112)
Creatinine, Ser: 0.9 mg/dL (ref 0.40–1.20)
GFR: 61.43 mL/min (ref >60.00)
GLUCOSE: 75 mg/dL (ref 70–99)
Potassium: 3.9 mEq/L (ref 3.5–5.1)
Sodium: 138 mEq/L (ref 135–145)
Total Bilirubin: 0.4 mg/dL (ref 0.2–1.2)
Total Protein: 7.4 g/dL (ref 6.0–8.3)

## 2018-04-02 LAB — TSH: TSH: 0.21 u[IU]/mL — ABNORMAL LOW (ref 0.35–4.50)

## 2018-04-02 LAB — LIPID PANEL
Cholesterol: 172 mg/dL (ref 0–200)
HDL: 67.1 mg/dL (ref >39.00)
LDL Cholesterol: 70 mg/dL (ref 0–99)
NonHDL: 104.46
Total CHOL/HDL Ratio: 3
Triglycerides: 170 mg/dL — ABNORMAL HIGH (ref 0.0–149.0)
VLDL: 34 mg/dL (ref 0.0–40.0)

## 2018-04-02 MED ORDER — ATORVASTATIN CALCIUM 20 MG PO TABS: 20.0000 mg | ORAL_TABLET | Freq: Every day | ORAL | 0 refills | Status: DC
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
## 2018-04-02 NOTE — Telephone Encounter (Signed)
Copied from [place] [phone]. Topic: Quick Communication - See Telephone Encounter >> Apr 02, 2018 12:29 PM Valla Leaver wrote: CRM for notification. See Telephone encounter for: 04/02/18. Patient says pharmacy said the hydrOXYzine (ATARAX/VISTARIL) 50 MG tablet needs PA. Prednisone was not called in and was supposed to be. Patient is in tears because she feels terrible and just wants to stop itching. If there is something else the insurance will cover for the irritation instead of waiting on PA, she would very much appreciate for that to be called in.

## 2018-04-02 NOTE — Assessment & Plan Note (Signed)
Well controlled on current regimen. Renal function stable, no changes today. 

## 2018-04-02 NOTE — Telephone Encounter (Signed)
Per Dr. Lupita Dawn note, she did not prescribe prednisone. She did prescribe the hydroxyzine. Pt was seen yesterday

## 2018-04-02 NOTE — Assessment & Plan Note (Signed)
Referral made in August for colonoscopy

## 2018-04-02 NOTE — Assessment & Plan Note (Signed)
Now with cellulitis of left arm secondary to neurotic itching .  Prescribing hydroxyzine , cephalexin,

## 2018-04-02 NOTE — Assessment & Plan Note (Signed)
Continue  adderall 20 mg bid.  Refills for 3 months given.

## 2018-04-02 NOTE — Assessment & Plan Note (Addendum)
Managed with statin   Lab Results  Component Value Date   CHOL 172 04/01/2018   HDL 67.10 04/01/2018   LDLCALC 70 04/01/2018   LDLDIRECT 167.0 04/30/2016   TRIG 170.0 (H) 04/01/2018   CHOLHDL 3 04/01/2018   Lab Results  Component Value Date   ALT 14 04/01/2018   AST 23 04/01/2018   ALKPHOS 85 04/01/2018   BILITOT 0.4 04/01/2018

## 2018-04-03 NOTE — Telephone Encounter (Signed)
The only other choice is benadryl every 8 hours,  Tell her to ask the pharmacist to run it withouut her insurance to see if the OOP cost is affordable for her.

## 2018-04-03 NOTE — Telephone Encounter (Signed)
Prednisone was sent in this morning. Pt is wanting to know if there is something else that can be called in instead of the hydroxyzine because it requires a prior authorization and pt is itching really bad.

## 2018-04-03 NOTE — Telephone Encounter (Signed)
Pt called back again stating that the pharmacy still did not have the rx for the prednisone. Told pt that I would contact the pharmacy and if they stated they did not have then I would give them a verbal for it. Called the pharmacy gave a verbal and called pt back to let her know. Also advised the pt per Dr. Derrel Nip that she shold as the pharmacy to run the hydroxyzine with out insurance and see how much it would cost her out of pocket. If to expensive then she could take the benadryl every 8hrs for the itching. Pt gave a verbal understanding.

## 2018-04-04 NOTE — Addendum Note (Signed)
Addended by: Crecencio Mc on: 04/04/2018 01:18 PM   Modules accepted: Orders

## 2018-04-08 ENCOUNTER — Telehealth: Payer: Self-pay

## 2018-04-08 NOTE — Telephone Encounter (Signed)
PA for Hydroxyizine has been submitted on covermymeds.

## 2018-04-11 ENCOUNTER — Other Ambulatory Visit: Payer: Self-pay | Admitting: Internal Medicine

## 2018-04-13 ENCOUNTER — Other Ambulatory Visit: Payer: Self-pay | Admitting: Internal Medicine

## 2018-04-17 DIAGNOSIS — L270 Generalized skin eruption due to drugs and medicaments taken internally: Secondary | ICD-10-CM

## 2018-04-17 NOTE — Telephone Encounter (Signed)
Medication has been approved through 03/12/2019. 

## 2018-04-21 ENCOUNTER — Other Ambulatory Visit: Payer: Self-pay | Admitting: Internal Medicine

## 2018-05-08 ENCOUNTER — Ambulatory Visit: Payer: Medicare HMO | Admitting: Internal Medicine

## 2018-05-09 ENCOUNTER — Encounter: Payer: Self-pay | Admitting: Internal Medicine

## 2018-05-28 ENCOUNTER — Encounter: Payer: Self-pay | Admitting: Internal Medicine

## 2018-05-28 ENCOUNTER — Encounter

## 2018-05-28 ENCOUNTER — Ambulatory Visit (INDEPENDENT_AMBULATORY_CARE_PROVIDER_SITE_OTHER): Payer: Medicare HMO | Admitting: Internal Medicine

## 2018-05-28 ENCOUNTER — Other Ambulatory Visit: Payer: Self-pay

## 2018-05-28 VITALS — BP 150/96 | HR 107 | Temp 99.2°F | Resp 16 | Ht 63.0 in | Wt 100.8 lb

## 2018-05-28 DIAGNOSIS — R636 Underweight: Secondary | ICD-10-CM | POA: Diagnosis not present

## 2018-05-28 DIAGNOSIS — R634 Abnormal weight loss: Secondary | ICD-10-CM

## 2018-05-28 DIAGNOSIS — M153 Secondary multiple arthritis: Secondary | ICD-10-CM

## 2018-05-28 DIAGNOSIS — Z79899 Other long term (current) drug therapy: Secondary | ICD-10-CM | POA: Diagnosis not present

## 2018-05-28 DIAGNOSIS — J302 Other seasonal allergic rhinitis: Secondary | ICD-10-CM

## 2018-05-28 DIAGNOSIS — J328 Other chronic sinusitis: Secondary | ICD-10-CM

## 2018-05-28 DIAGNOSIS — R7989 Other specified abnormal findings of blood chemistry: Secondary | ICD-10-CM | POA: Diagnosis not present

## 2018-05-28 DIAGNOSIS — R413 Other amnesia: Secondary | ICD-10-CM

## 2018-05-28 MED ORDER — PREDNISONE 10 MG PO TABS: ORAL_TABLET | ORAL | 0 refills | Status: DC

## 2018-05-28 MED ORDER — METOPROLOL SUCCINATE ER 25 MG PO TB24: 25.0000 mg | ORAL_TABLET | Freq: Every day | ORAL | 3 refills | Status: DC
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
## 2018-05-28 NOTE — Patient Instructions (Signed)
Adding famotidine 20 mg every 12 hours  And allegra 180 mg once daily  Continue the hydoxyzine at bedtime and more as needed    Prednisone taper for the ears.

## 2018-05-29 DIAGNOSIS — J328 Other chronic sinusitis: Secondary | ICD-10-CM | POA: Insufficient documentation

## 2018-05-29 LAB — CBC WITH DIFFERENTIAL/PLATELET
BASOS ABS: 0.1 10*3/uL (ref 0.0–0.1)
Basophils Relative: 1.3 % (ref 0.0–3.0)
EOS ABS: 0.3 10*3/uL (ref 0.0–0.7)
Eosinophils Relative: 2.9 % (ref 0.0–5.0)
HCT: 38.7 % (ref 36.0–46.0)
Hemoglobin: 12.8 g/dL (ref 12.0–15.0)
Lymphocytes Relative: 16.1 % (ref 12.0–46.0)
Lymphs Abs: 1.4 10*3/uL (ref 0.7–4.0)
MCHC: 33.1 g/dL (ref 30.0–36.0)
MCV: 85.5 fl (ref 78.0–100.0)
Monocytes Absolute: 0.6 10*3/uL (ref 0.1–1.0)
Monocytes Relative: 6.4 % (ref 3.0–12.0)
Neutro Abs: 6.4 10*3/uL (ref 1.4–7.7)
Neutrophils Relative %: 73.3 % (ref 43.0–77.0)
Platelets: 331 10*3/uL (ref 150.0–400.0)
RBC: 4.53 Mil/uL (ref 3.87–5.11)
RDW: 13.1 % (ref 11.5–15.5)
WBC: 8.8 10*3/uL (ref 4.0–10.5)

## 2018-05-29 LAB — COMPREHENSIVE METABOLIC PANEL
ALT: 13 U/L (ref 0–35)
AST: 18 U/L (ref 0–37)
Albumin: 4.5 g/dL (ref 3.5–5.2)
Alkaline Phosphatase: 85 U/L (ref 39–117)
BUN: 19 mg/dL (ref 6–23)
CO2: 28 mEq/L (ref 19–32)
Calcium: 10.3 mg/dL (ref 8.4–10.5)
Chloride: 102 mEq/L (ref 96–112)
Creatinine, Ser: 0.97 mg/dL (ref 0.40–1.20)
GFR: 56.32 mL/min — ABNORMAL LOW (ref >60.00)
Glucose, Bld: 105 mg/dL — ABNORMAL HIGH (ref 70–99)
Potassium: 4.7 mEq/L (ref 3.5–5.1)
Sodium: 139 mEq/L (ref 135–145)
Total Bilirubin: 0.4 mg/dL (ref 0.2–1.2)
Total Protein: 6.9 g/dL (ref 6.0–8.3)

## 2018-05-29 LAB — SEDIMENTATION RATE: Sed Rate: 4 mm/hr (ref 0–30)

## 2018-05-29 LAB — T4, FREE: FREE T4: 0.79 ng/dL (ref 0.60–1.60)

## 2018-05-29 LAB — VITAMIN B12: VITAMIN B 12: 364 pg/mL (ref 211–911)

## 2018-05-29 LAB — TSH: TSH: 0.46 u[IU]/mL (ref 0.35–4.50)

## 2018-05-29 MED ORDER — AMPHETAMINE-DEXTROAMPHETAMINE 15 MG PO TABS: 15.0000 mg | ORAL_TABLET | Freq: Two times a day (BID) | ORAL | 0 refills | Status: DC
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
## 2018-05-29 NOTE — Assessment & Plan Note (Addendum)
With ongoing weight loss, tremors, affect,  Suspect medication induced.  Given her normal thyroid studies today, up to date on screening, and absence of other symptoms suggestive of occult CA,   Will reduce dose of stimulant and encourage an increased protein intake.
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
## 2018-05-29 NOTE — Assessment & Plan Note (Signed)
I am reducing her dose of adderall  To 15 mg .  This may eliminate the need for alprazolam for insomnia as well.

## 2018-05-30 LAB — THYROID STIMULATING IMMUNOGLOBULIN: TSI: <89 % baseline (ref <140)

## 2018-05-30 LAB — THYROID PEROXIDASE ANTIBODY: Thyroperoxidase Ab SerPl-aCnc: <1 IU/mL (ref <9)

## 2018-06-04 ENCOUNTER — Telehealth: Payer: Self-pay | Admitting: Internal Medicine

## 2018-06-04 NOTE — Telephone Encounter (Signed)
Lab results have been mailed.

## 2018-06-04 NOTE — Telephone Encounter (Signed)
Copied from [place] [id number]. Topic: General - Other >> Jun 04, 2018 12:42 PM Keene Breath wrote: Reason for CRM: Patient called and stated that she wanted  her lab results to be mailed to her because she is having computer issues and cannot get into her My Chart at the moment.  Please advise if you have any questions and call patient at [phone]

## 2018-06-30 ENCOUNTER — Other Ambulatory Visit: Payer: Self-pay

## 2018-06-30 ENCOUNTER — Telehealth: Payer: Self-pay | Admitting: Internal Medicine

## 2018-06-30 ENCOUNTER — Ambulatory Visit (INDEPENDENT_AMBULATORY_CARE_PROVIDER_SITE_OTHER): Payer: Medicare HMO | Admitting: Family Medicine

## 2018-06-30 DIAGNOSIS — L309 Dermatitis, unspecified: Secondary | ICD-10-CM | POA: Diagnosis not present

## 2018-06-30 DIAGNOSIS — L03114 Cellulitis of left upper limb: Secondary | ICD-10-CM

## 2018-06-30 DIAGNOSIS — L299 Pruritus, unspecified: Secondary | ICD-10-CM

## 2018-06-30 MED ORDER — DOXYCYCLINE HYCLATE 100 MG PO TABS: 100.0000 mg | ORAL_TABLET | Freq: Two times a day (BID) | ORAL | 0 refills | Status: DC

## 2018-06-30 MED ORDER — HYDROXYZINE HCL 50 MG PO TABS: 50.0000 mg | ORAL_TABLET | Freq: Three times a day (TID) | ORAL | 3 refills | Status: DC | PRN

## 2018-06-30 MED ORDER — MONTELUKAST SODIUM 10 MG PO TABS: 10.0000 mg | ORAL_TABLET | Freq: Every day | ORAL | 3 refills | Status: DC

## 2018-06-30 NOTE — Telephone Encounter (Signed)
FYI Spoke w/ patient.  Patient c/o having a severe itchy rash on all 4 limbs due to having dust mites in her home.  Pt stated the pain was a 10 out 10 on pain scale.  Pt said she has taken her last hydroxyzine tab this morning and has been using cold compresses and creams but has no relief.  Pt stated that she has red spots, whelps all over and rash is warm to touch with swelling in left arm.  Pt denies having a fever.  Pt scheduled for virtual appt today w/ Philis Nettle, FNP @ 1:40 pm.  Advise pt to go to Urgent Care if symptoms worsen or became unbearable before appt.  Also advised pt to wear a mask if she does go to Urgent Care.

## 2018-06-30 NOTE — Telephone Encounter (Signed)
Called patient to triage for virtual appt.  No answer.  LMTCB.

## 2018-06-30 NOTE — Telephone Encounter (Signed)
Spoke w/ patient.  Patient c/o having a severe itchy rash on all 4 limbs due to having dust mites in her home.  Pt stated the pain was a 10 out 10 on pain scale.  Pt said she has taken her last hydroxyzine tab this morning and has been using cold compresses and creams but has no relief.  Pt stated that she has red spots, whelps all over and rash is warm to touch with swelling in left arm.  Pt denies having a fever.  Pt scheduled for virtual appt today w/ Philis Nettle, FNP @ 1:40 pm.  Advise pt to go to Urgent Care if symptoms worsen or became unbearable before appt.  Also advised pt to wear a mask if she does go to Urgent Care.

## 2018-06-30 NOTE — Telephone Encounter (Signed)
Patient called and stated that she is in severe pain and needs medication for her itching. She said that her whole body is itching and she feels raw. Call back patient at [phone] (mobile)

## 2018-06-30 NOTE — Telephone Encounter (Signed)
Unable to reach office. Pt would like a call back regarding. Please advise

## 2018-06-30 NOTE — Progress Notes (Signed)
Patient ID: Sonya Davis, female   DOB: 09-Jul-1945, 73 y.o.   MRN: [id number]  Virtual Visit via video Note  This visit type was conducted due to national recommendations for restrictions regarding the COVID-19 pandemic (e.g. social distancing).  This format is felt to be most appropriate for this patient at this time.  All issues noted in this document were discussed and addressed.  No physical exam was performed (except for noted visual exam findings with Video Visits).   I connected with Meredith Staggers on 06/30/18 at  1:40 PM EDT by a  telephone and verified that I am speaking with the correct person using two identifiers. Location patient: home Location provider: [place] Persons participating in the virtual visit: patient, provider  I discussed the limitations, risks, security and privacy concerns of performing an evaluation and management service by telephone and the availability of in person appointments. I also discussed with the patient that there may be a patient responsible charge related to this service. The patient expressed understanding and agreed to proceed.  Interactive audio and video telecommunications were attempted between this provider and patient, however failed, due to patient having technical difficulties. We continued and completed visit via telephone only.  HPI:  Patient and I connected via telephone due to itching on bilateral upper extremities, and she suspects she has infection on her left arm due to it being hot and red.  Patient has a long history of itching due to dermatitis.  This itchy with dermatitis and itching has been present for over a year.  Patient states at times she will become so itchy that she will scratch her skin until bleed.  States she has not gotten a refill of her hydroxyzine recently.  States she has called [facility] 3 times and has not been able to get through to get her refill.  Patient states that her hydroxyzine it is very  difficult for her to get to sleep due to the extreme itching. States there are days she will shower 3 times a day because she is so itchy.  Denies fever or chills.  Denies cough, shortness of breath or wheezing.  Denies body aches.  Denies GI or GU issues.   ROS: See pertinent positives and negatives per HPI.  Past Medical History:  Diagnosis Date  . ADD (attention deficit disorder)   . Allergy    seasonal  . Cataract    surgery to remove them  . Endometriosis of ovary    prior GYN [name]  . 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 Fracture closed, pubis ([place]) 1981  . Fracture of bone of left shoulder 1981  . Fracture of maxilla ([place]) 1981  . Gastritis   . GERD (gastroesophageal reflux disease)   . History of ovarian cyst   . History of pericarditis 1988   occurred after flu shot, with pneumonia  . Osteoporosis   . Ovarian cyst    right, endometriosis  . Pericarditis    does not receive flu vaccine becuase of this  . Rheumatoid arthritis(714.0) 2000   per patient by labs    Past Surgical History:  Procedure Laterality Date  . ABDOMINAL HYSTERECTOMY     secondary to endometriosis  . ANTERIOR CRUCIATE LIGAMENT REPAIR  age 29   left  . APPENDECTOMY     at age 58  . ARTHROSCOPIC REPAIR ACL  1999   left knee  .  CARPAL TUNNEL RELEASE     bilateral surgeries  . CATARACT EXTRACTION  Mar 2013   right eye, [place]  . CYSTECTOMY  1963  . EYE SURGERY  2005   left cataract   . OOPHORECTOMY    . septoplasty  Dec 2006   [name],   . SEPTOPLASTY  2006    Family History  Problem Relation Age of Onset  . Cancer Mother        lung  . Heart disease Father   . Crohn's disease Sister   . Breast cancer Cousin   . Colon cancer Neg Hx    Social History   Tobacco Use  . Smoking status: Former Smoker    Types: Cigarettes    Last attempt to quit: 02/11/2001    Years since quitting: 17.3  .  Smokeless tobacco: Never Used  Substance Use Topics  . Alcohol use: Yes    Alcohol/week: 5.0 standard drinks    Types: 5 Standard drinks or equivalent per week    Current Outpatient Medications:  .  albuterol (PROVENTIL) (2.5 MG/3ML) 0.083% nebulizer solution, Take 3 mLs (2.5 mg total) by nebulization every 6 (six) hours as needed for wheezing or shortness of breath., Disp: 75 mL, Rfl: 0 .  ALPRAZolam (XANAX) 0.5 MG tablet, TAKE ONE TABLET AT BED- TIME AS NEEDED., Disp: 30 tablet, Rfl: 5 .  amLODipine (NORVASC) 5 MG tablet, Take 1 tablet (5 mg total) by mouth daily., Disp: 90 tablet, Rfl: 1 .  amphetamine-dextroamphetamine (ADDERALL) 15 MG tablet, Take 1 tablet by mouth 2 (two) times daily., Disp: 60 tablet, Rfl: 0 .  amphetamine-dextroamphetamine (ADDERALL) 20 MG tablet, Take 1 tablet (20 mg total) by mouth 2 (two) times daily., Disp: 60 tablet, Rfl: 0 .  atorvastatin (LIPITOR) 20 MG tablet, Take 1 tablet (20 mg total) by mouth daily., Disp: 90 tablet, Rfl: 0 .  calcium carbonate (OS-CAL) 600 MG TABS, Take 600 mg by mouth 2 (two) times daily with a meal.  , Disp: , Rfl:  .  cetirizine (ZYRTEC) 10 MG tablet, Take 10 mg by mouth daily., Disp: , Rfl:  .  cholecalciferol (VITAMIN D) 1000 UNITS tablet, Take 1,000 Units by mouth daily.  , Disp: , Rfl:  .  cyclobenzaprine (FLEXERIL) 10 MG tablet, Take 1 tablet (10 mg total) by mouth 3 (three) times daily as needed for muscle spasms., Disp: 20 tablet, Rfl: 0 .  Docusate Calcium (STOOL SOFTENER PO), Take by mouth.  , Disp: , Rfl:  .  doxepin (SINEQUAN) 25 MG capsule, , Disp: , Rfl:  .  fexofenadine (ALLEGRA) 180 MG tablet, Take 1 tablet (180 mg total) by mouth daily., Disp: 90 tablet, Rfl: 1 .  Fluticasone-Salmeterol (ADVAIR DISKUS) 250-50 MCG/DOSE AEPB, INHALE ONE PUFF INTO LUNGS TWICE DAILY, Disp: 60 each, Rfl: 0 .  furosemide (LASIX) 20 MG tablet, Take 1 tablet (20 mg total) by mouth daily as needed., Disp: 30 tablet, Rfl: 0 .   HYDROcodone-acetaminophen (NORCO) 10-325 MG tablet, Take 1 tablet by mouth at bedtime and may repeat dose one time if needed. For severe pain, Disp: 45 tablet, Rfl: 0 .  HYDROcodone-acetaminophen (NORCO) 10-325 MG tablet, Take 1 tablet by mouth at bedtime and may repeat dose one time if needed. For severe pain, Disp: 45 tablet, Rfl: 0 .  hydrOXYzine (ATARAX/VISTARIL) 50 MG tablet, Take 1 tablet (50 mg total) by mouth 3 (three) times daily as needed., Disp: 90 tablet, Rfl: 3 .  lidocaine-prilocaine (EMLA) cream, Apply 1  application topically as needed. Lidocaine 2.26%/prilocaine 2.26%/meloxicam 0.0875% cream COMPOUNDED, Disp: 5800 g, Rfl: 3 .  metoprolol succinate (TOPROL-XL) 25 MG 24 hr tablet, Take 1 tablet (25 mg total) by mouth daily., Disp: 90 tablet, Rfl: 3 .  mirtazapine (REMERON) 15 MG tablet, 1/2 to 1 tablet 30 minutes before bedtime daily, Disp: 90 tablet, Rfl: 1 .  montelukast (SINGULAIR) 10 MG tablet, Take 1 tablet (10 mg total) by mouth at bedtime., Disp: 30 tablet, Rfl: 3 .  predniSONE (DELTASONE) 10 MG tablet, 6 tablets on Day 1 , then reduce by 1 tablet daily until gone, Disp: 21 tablet, Rfl: 0 .  PROAIR HFA 108 (90 Base) MCG/ACT inhaler, TAKE TWO PUFFS EVERY 6 HOURS AS NEEDED FOR WHEEZE OR SHORTNESS OF BREATH., Disp: 8.5 g, Rfl: 0 .  silver sulfADIAZINE (SILVADENE) 1 % cream, Apply 1 application topically daily., Disp: 50 g, Rfl: 0 .  traMADol (ULTRAM) 50 MG tablet, Take 2 tablets (100 mg total) by mouth every 6 (six) hours as needed. May refill on or after  Apr 26 2017, Disp: 240 tablet, Rfl: 5 .  traZODone (DESYREL) 50 MG tablet, Take 0.5-1 tablets (25-50 mg total) by mouth at bedtime., Disp: 90 tablet, Rfl: 0 .  triamcinolone cream (KENALOG) 0.1 %, Apply 1 application topically 2 (two) times daily., Disp: 30 g, Rfl: 2 .  montelukast (SINGULAIR) 10 MG tablet, Take 1 tablet (10 mg total) by mouth daily., Disp: 30 tablet, Rfl: 5  EXAM:  GENERAL: alert, oriented, sounds well and in no  acute distress  LUNGS: Speaking in full sentences, no signs of respiratory distress, breathing rate appears normal, no obvious SOB, gasping or wheezing  PSYCH/NEURO: pleasant and cooperative, no obvious depression or anxiety, speech and thought processing grossly intact  ASSESSMENT AND PLAN:  Discussed the following assessment and plan:  Dermatitis - Plan: doxycycline (VIBRA-TABS) 100 MG tablet, hydrOXYzine (ATARAX/VISTARIL) 50 MG tablet, montelukast (SINGULAIR) 10 MG tablet  Itching - Plan: hydrOXYzine (ATARAX/VISTARIL) 50 MG tablet, montelukast (SINGULAIR) 10 MG tablet  Cellulitis of left upper extremity - Plan: doxycycline (VIBRA-TABS) 100 MG tablet  Suspect patient's current complaints of itching is related to her chronic dermatitis.  We will refill her hydroxyzine and Singulair so she can resume using these medications to control her symptoms.  Due to description of redness and heat of left upper extremity I am concerned that she may have a cellulitis of the left arm related to her itching.  We will prescribe doxycycline twice daily for 10 days to cover her cellulitis skin infection.  Encourage patient to try and resist the urge to scratch skin as much as possible, advised that itching and scratching skin opens up the skin and increases her risk for infection.  Encourage patient to keep skin well moisturized by using sensitive skin moisturizing cream such as a Cetaphil or CeraVe.  Also encouraged patient that most to shower once per day unless she has visible swelling of skin that requires her to shower a second or third time.  Advised that showering multiple times per day with very hot water most likely is drying her skin out even more exacerbating the itchiness.  Advised when showering, to use warm water, and mild soap such as a Dove or Cetaphil or CeraVe soap and pat skin dry with a towel.  Advised to moisturize skin soon after getting out of shower to help lock in the moisture and avoid  drying out.   I discussed the assessment and treatment plan with  the patient. The patient was provided an opportunity to ask questions and all were answered. The patient agreed with the plan and demonstrated an understanding of the instructions.   The patient was advised to call back or seek an in-person evaluation if the symptoms worsen or if the condition fails to improve as anticipated.  Patient will otherwise keep regularly scheduled follow-up as planned.  15 minutes spent on the phone with patient discussing her symptoms and plan of care moving forward.  Jodelle Green, FNP

## 2018-06-30 NOTE — Telephone Encounter (Signed)
FYI - pt is doing a virtual visit with Philis Nettle, NP today at pm. :

## 2018-07-07 ENCOUNTER — Other Ambulatory Visit: Payer: Self-pay | Admitting: Internal Medicine

## 2018-07-23 ENCOUNTER — Other Ambulatory Visit: Payer: Self-pay | Admitting: Internal Medicine

## 2018-07-24 NOTE — Telephone Encounter (Signed)
Refilled: 04/28/2018 Last OV: 05/28/2018 Next OV: not scheduled

## 2018-07-25 NOTE — Telephone Encounter (Signed)
Since she has not had 3 refills since last visit she doesn't need an appointment until [facility] July .please schedule

## 2018-07-28 ENCOUNTER — Telehealth: Payer: Self-pay

## 2018-07-28 NOTE — Telephone Encounter (Signed)
Error

## 2018-08-13 ENCOUNTER — Other Ambulatory Visit: Payer: Self-pay | Admitting: Internal Medicine

## 2018-08-13 ENCOUNTER — Telehealth: Payer: Self-pay | Admitting: Internal Medicine

## 2018-08-13 DIAGNOSIS — L309 Dermatitis, unspecified: Secondary | ICD-10-CM

## 2018-08-13 NOTE — Telephone Encounter (Signed)
Copied from [place] [id number]. Topic: Quick Communication - Rx Refill/Question >> Aug 13, 2018  9:04 AM Celene Kras A wrote: Medication: Prednisone  Has the patient contacted their pharmacy? No. Pt caleld stating her cellulitus and rash has not gotten better. She states it is itching very badly and that she is pain. She states it has started to bleed. Please advise.  (Agent: If no, request that the patient contact the pharmacy for the refill.) (Agent: If yes, when and what did the pharmacy advise?)  Preferred Pharmacy (with phone number or street name): [place], [place] - [address] [place] [address] Phone: [phone] Fax: [phone] Not a 24 hour pharmacy; exact hours not known.    Agent: Please be advised that RX refills may take up to 3 business days. We ask that you follow-up with your pharmacy.

## 2018-08-13 NOTE — Telephone Encounter (Signed)
Refill of prednisone is not appropriate if the  Rash is due to cellultiis ,  She needs to see a dermatologist again because I am out of options  If the last regimen of cephalexin and hydroxyzine , which was prescribed in March , did not resolve the rash.

## 2018-08-14 NOTE — Telephone Encounter (Signed)
Spoke with pt to let her know that the prednisone would not be refilled and why. Pt stated that she is okay with the referral to dermatology.

## 2018-08-15 NOTE — Telephone Encounter (Signed)
I will refill for 30 days,  NEEDS OFFICE VISIT

## 2018-08-15 NOTE — Telephone Encounter (Signed)
Refilled: 07/25/2018 Last OV: 05/28/2018 Next OV: not scheduled

## 2018-08-22 ENCOUNTER — Encounter: Payer: Self-pay | Admitting: Internal Medicine

## 2018-08-22 DIAGNOSIS — L986 Other infiltrative disorders of the skin and subcutaneous tissue: Secondary | ICD-10-CM | POA: Diagnosis not present

## 2018-08-22 DIAGNOSIS — D2339 Other benign neoplasm of skin of other parts of face: Secondary | ICD-10-CM | POA: Diagnosis not present

## 2018-08-22 DIAGNOSIS — L309 Dermatitis, unspecified: Secondary | ICD-10-CM | POA: Diagnosis not present

## 2018-08-22 DIAGNOSIS — L720 Epidermal cyst: Secondary | ICD-10-CM | POA: Diagnosis not present

## 2018-09-03 ENCOUNTER — Other Ambulatory Visit: Payer: Self-pay | Admitting: Internal Medicine

## 2018-09-03 NOTE — Telephone Encounter (Signed)
Pt has been scheduled. Pt is aware of appt date and time.  

## 2018-09-04 ENCOUNTER — Other Ambulatory Visit: Payer: Self-pay | Admitting: Internal Medicine

## 2018-09-04 ENCOUNTER — Other Ambulatory Visit: Payer: Self-pay

## 2018-09-04 ENCOUNTER — Ambulatory Visit (INDEPENDENT_AMBULATORY_CARE_PROVIDER_SITE_OTHER): Payer: Medicare HMO | Admitting: Internal Medicine

## 2018-09-04 ENCOUNTER — Encounter: Payer: Self-pay | Admitting: Internal Medicine

## 2018-09-04 DIAGNOSIS — L299 Pruritus, unspecified: Secondary | ICD-10-CM | POA: Diagnosis not present

## 2018-09-04 DIAGNOSIS — J438 Other emphysema: Secondary | ICD-10-CM

## 2018-09-04 DIAGNOSIS — R69 Illness, unspecified: Secondary | ICD-10-CM | POA: Diagnosis not present

## 2018-09-04 DIAGNOSIS — F909 Attention-deficit hyperactivity disorder, unspecified type: Secondary | ICD-10-CM | POA: Diagnosis not present

## 2018-09-04 DIAGNOSIS — L282 Other prurigo: Secondary | ICD-10-CM | POA: Diagnosis not present

## 2018-09-04 DIAGNOSIS — L309 Dermatitis, unspecified: Secondary | ICD-10-CM

## 2018-09-04 MED ORDER — AMLODIPINE BESYLATE 5 MG PO TABS: 5.0000 mg | ORAL_TABLET | Freq: Every day | ORAL | 1 refills | Status: AC

## 2018-09-04 MED ORDER — MIRTAZAPINE 15 MG PO TABS: 15.0000 mg | ORAL_TABLET | Freq: Every day | ORAL | 1 refills | Status: AC

## 2018-09-04 MED ORDER — HYDROXYZINE HCL 50 MG PO TABS: 50.0000 mg | ORAL_TABLET | Freq: Three times a day (TID) | ORAL | 3 refills | Status: DC | PRN

## 2018-09-04 MED ORDER — TRAZODONE HCL 50 MG PO TABS: 25.0000 mg | ORAL_TABLET | Freq: Every day | ORAL | 0 refills | Status: DC

## 2018-09-04 MED ORDER — METOPROLOL SUCCINATE ER 25 MG PO TB24: 25.0000 mg | ORAL_TABLET | Freq: Every day | ORAL | 3 refills | Status: AC

## 2018-09-04 MED ORDER — ALPRAZOLAM 0.5 MG PO TABS: ORAL_TABLET | ORAL | 5 refills | Status: DC

## 2018-09-04 MED ORDER — HYDROCODONE-ACETAMINOPHEN 10-325 MG PO TABS: 1.0000 | ORAL_TABLET | Freq: Every evening | ORAL | 0 refills | Status: DC | PRN

## 2018-09-04 MED ORDER — ATORVASTATIN CALCIUM 20 MG PO TABS: 20.0000 mg | ORAL_TABLET | Freq: Every day | ORAL | 0 refills | Status: AC

## 2018-09-04 MED ORDER — AMPHETAMINE-DEXTROAMPHETAMINE 15 MG PO TABS: 15.0000 mg | ORAL_TABLET | Freq: Two times a day (BID) | ORAL | 0 refills | Status: DC

## 2018-09-04 NOTE — Progress Notes (Signed)
Refills needed:   Hydroxyzine   Alprazolam   Doxepin

## 2018-09-04 NOTE — Progress Notes (Signed)
Telephone Note  This visit type was conducted due to national recommendations for restrictions regarding the COVID-19 pandemic (e.g. social distancing).  This format is felt to be most appropriate for this patient at this time.  All issues noted in this document were discussed and addressed.  No physical exam was performed (except for noted visual exam findings with Video Visits).   I connected with@ on 09/04/18 at 10:30 AM EDT by telephone and verified that I am speaking with the correct person using two identifiers. Location patient: home Location provider: work or home office Persons participating in the virtual visit: patient, provider  I discussed the limitations, risks, security and privacy concerns of performing an evaluation and management service by telephone and the availability of in person appointments. I also discussed with the patient that there may be a patient responsible charge related to this service. The patient expressed understanding and agreed to proceed.  Reason for visit:  Follow up chronic conditions  HPI:  Chronic Dermatitis:  Treated for cellulitis of arm secondary to uncontrolled itching leading to excoriation  During period with [place] did not refill her hydroxyzine .  She has returned to [facility] , saw Dr  Evorn Gong  Recently , and was given triamcinolone .  She hd a biopsy done of one of the  lesions on her  Back. On June 12. She has not received the results yet.  ADD:  She has been without medications since April .  Her daily dose had been reduced as a trial due to symptom attributed ot hte medication (tremor,  Unintentional weight loss) but never requested a refill .  Without the medication the symptoms have resolved but she is having difficulty staying on task , finishing projects and remembering details  Depression with Insomnia/amxiety:  She reports improved symptoms with mirtazipine,  And that her alprazolam use is 3-4 times per week.  Not nightly . Using trazodoe  and   Chronic pain:  She has not had any ER visits  And has not requested any early refills.  Her Refill history was confirmed via [place] Controlled Substance database by me today during her visit and there have been no prescriptions of controlled substances filled from any providers other than me. .   The patient has no signs or symptoms of COVID 19 infection (fever, cough, sore throat  or shortness of breath beyond what is typical for patient).  Patient denies contact with other persons with the above mentioned symptoms or with anyone confirmed to have COVID 19   She has been relatively asymptomatic,  At baseline for several months.  She is no longer smoking,  And she is adherent to her regimen of  inhaled therapy.   ROS: See pertinent positives and negatives per HPI.  Past Medical History:  Diagnosis Date  . ADD (attention deficit disorder)   . Allergy    seasonal  . Cataract    surgery to remove them  . Endometriosis of ovary    prior GYN [name]  . 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 Fracture closed, pubis ([place]) 1981  . Fracture of bone of left shoulder 1981  . Fracture of maxilla ([place]) 1981  . Gastritis   . GERD (gastroesophageal reflux disease)   . History of ovarian cyst   . History of pericarditis 1988   occurred after flu shot, with pneumonia  . Osteoporosis   . Ovarian  cyst    right, endometriosis  . Pericarditis    does not receive flu vaccine becuase of this  . Rheumatoid arthritis(714.0) 2000   per patient by labs    Past Surgical History:  Procedure Laterality Date  . ABDOMINAL HYSTERECTOMY     secondary to endometriosis  . ANTERIOR CRUCIATE LIGAMENT REPAIR  age 69   left  . APPENDECTOMY     at age 69  . ARTHROSCOPIC REPAIR ACL  1999   left knee  . CARPAL TUNNEL RELEASE     bilateral surgeries  . CATARACT EXTRACTION  Mar 2013   right eye, [place]  . CYSTECTOMY  1963  . EYE SURGERY   2005   left cataract   . OOPHORECTOMY    . septoplasty  Dec 2006   [name],   . SEPTOPLASTY  2006    Family History  Problem Relation Age of Onset  . Cancer Mother        lung  . Heart disease Father   . Crohn's disease Sister   . Breast cancer Cousin   . Colon cancer Neg Hx     SOCIAL HX:  reports that she quit smoking about 17 years ago. Her smoking use included cigarettes. She has never used smokeless tobacco. She reports current alcohol use of about 5.0 standard drinks of alcohol per week. She reports that she does not use drugs.   Current Outpatient Medications:  .  albuterol (PROVENTIL) (2.5 MG/3ML) 0.083% nebulizer solution, Take 3 mLs (2.5 mg total) by nebulization every 6 (six) hours as needed for wheezing or shortness of breath., Disp: 75 mL, Rfl: 0 .  ALPRAZolam (XANAX) 0.5 MG tablet, TAKE ONE TABLET AT BED- TIME AS NEEDED., Disp: 30 tablet, Rfl: 5 .  amLODipine (NORVASC) 5 MG tablet, Take 1 tablet (5 mg total) by mouth daily., Disp: 90 tablet, Rfl: 1 .  amphetamine-dextroamphetamine (ADDERALL) 15 MG tablet, Take 1 tablet by mouth 2 (two) times daily., Disp: 60 tablet, Rfl: 0 .  atorvastatin (LIPITOR) 20 MG tablet, Take 1 tablet (20 mg total) by mouth daily., Disp: 90 tablet, Rfl: 0 .  calcium carbonate (OS-CAL) 600 MG TABS, Take 600 mg by mouth 2 (two) times daily with a meal.  , Disp: , Rfl:  .  cetirizine (ZYRTEC) 10 MG tablet, Take 10 mg by mouth daily., Disp: , Rfl:  .  cholecalciferol (VITAMIN D) 1000 UNITS tablet, Take 1,000 Units by mouth daily.  , Disp: , Rfl:  .  cyclobenzaprine (FLEXERIL) 10 MG tablet, Take 1 tablet (10 mg total) by mouth 3 (three) times daily as needed for muscle spasms., Disp: 20 tablet, Rfl: 0 .  Docusate Calcium (STOOL SOFTENER PO), Take by mouth.  , Disp: , Rfl:  .  doxepin (SINEQUAN) 25 MG capsule, , Disp: , Rfl:  .  doxycycline (VIBRA-TABS) 100 MG tablet, Take 1 tablet (100 mg total) by mouth 2 (two) times daily., Disp: 20 tablet,  Rfl: 0 .  fexofenadine (ALLEGRA) 180 MG tablet, Take 1 tablet (180 mg total) by mouth daily., Disp: 90 tablet, Rfl: 1 .  Fluticasone-Salmeterol (ADVAIR DISKUS) 250-50 MCG/DOSE AEPB, INHALE ONE PUFF INTO LUNGS TWICE DAILY, Disp: 60 each, Rfl: 0 .  furosemide (LASIX) 20 MG tablet, Take 1 tablet (20 mg total) by mouth daily as needed., Disp: 30 tablet, Rfl: 0 .  HYDROcodone-acetaminophen (NORCO) 10-325 MG tablet, Take 1 tablet by mouth at bedtime and may repeat dose one time if needed. For severe pain, Disp:  45 tablet, Rfl: 0 .  [START ON 09/14/2018] HYDROcodone-acetaminophen (NORCO) 10-325 MG tablet, Take 1 tablet by mouth at bedtime and may repeat dose one time if needed. For severe pain, Disp: 45 tablet, Rfl: 0 .  hydrOXYzine (ATARAX/VISTARIL) 50 MG tablet, Take 1 tablet (50 mg total) by mouth 3 (three) times daily as needed., Disp: 90 tablet, Rfl: 3 .  lidocaine-prilocaine (EMLA) cream, Apply 1 application topically as needed. Lidocaine 2.26%/prilocaine 2.26%/meloxicam 0.0875% cream COMPOUNDED, Disp: 5800 g, Rfl: 3 .  metoprolol succinate (TOPROL-XL) 25 MG 24 hr tablet, Take 1 tablet (25 mg total) by mouth daily., Disp: 90 tablet, Rfl: 3 .  mirtazapine (REMERON) 15 MG tablet, Take 1 tablet (15 mg total) by mouth at bedtime., Disp: 90 tablet, Rfl: 1 .  montelukast (SINGULAIR) 10 MG tablet, Take 1 tablet (10 mg total) by mouth at bedtime., Disp: 30 tablet, Rfl: 3 .  PROAIR HFA 108 (90 Base) MCG/ACT inhaler, TAKE TWO PUFFS EVERY 6 HOURS AS NEEDED FOR WHEEZE OR SHORTNESS OF BREATH., Disp: 8.5 g, Rfl: 0 .  silver sulfADIAZINE (SILVADENE) 1 % cream, Apply 1 application topically daily., Disp: 50 g, Rfl: 0 .  traMADol (ULTRAM) 50 MG tablet, Take 2 tablets (100 mg total) by mouth every 6 (six) hours as needed. May refill on or after  Apr 26 2017, Disp: 240 tablet, Rfl: 5 .  triamcinolone cream (KENALOG) 0.1 %, Apply 1 application topically 2 (two) times daily., Disp: 30 g, Rfl: 2 .  montelukast (SINGULAIR) 10  MG tablet, Take 1 tablet (10 mg total) by mouth daily., Disp: 30 tablet, Rfl: 5 .  traZODone (DESYREL) 50 MG tablet, Take 0.5-1 tablets (25-50 mg total) by mouth at bedtime., Disp: 90 tablet, Rfl: 0 .  triamcinolone ointment (KENALOG) 0.1 %, , Disp: , Rfl:   EXAM:  VITALS per patient if applicable:   General impression: alert, cooperative and articulate.  No signs of being in distress  Lungs: speech is fluent sentence length suggests that patient is not short of breath and not punctuated by cough, sneezing or sniffing. [name] Kitchen   Psych: affect normal.  speech is articulate and non pressured .  Denies suicidal thoughts    ASSESSMENT AND PLAN:  Discussed the following assessment and plan: COPD (chronic obstructive pulmonary disease) ([place]) Managed with LABA and inhaled steroid .  No symptoms currently.   Attention deficit disorder of adult with hyperactivity Will resume adderall at lower dose, 15 mg bid  and monitor for side effects  Chronic papular urticaria Present for over a year. Recent June 12 Biopsy results pending.   The possibiity of a drug reaction resulted in the systematic suspension of all considered offending agents with no change.  Continue hydroxyzine tid/qid and triamcinolone topical prescribed  by Dr Evorn Gong     I discussed the assessment and treatment plan with the patient. The patient was provided an opportunity to ask questions and all were answered. The patient agreed with the plan and demonstrated an understanding of the instructions.   The patient was advised to call back or seek an in-person evaluation if the symptoms worsen or if the condition fails to improve as anticipated.  I provided 25 minutes of non-face-to-face time during this encounter.   Sonya Mc, MD

## 2018-09-06 ENCOUNTER — Encounter: Payer: Self-pay | Admitting: Internal Medicine

## 2018-09-06 NOTE — Assessment & Plan Note (Signed)
Managed with LABA and inhaled steroid .  No symptoms currently.

## 2018-09-06 NOTE — Assessment & Plan Note (Addendum)
Will resume adderall at lower dose, 15 mg bid  and monitor for side effects

## 2018-09-06 NOTE — Assessment & Plan Note (Signed)
Present for over a year. Recent June 12 Biopsy results pending.   The possibiity of a drug reaction resulted in the systematic suspension of all considered offending agents with no change.  Continue hydroxyzine tid/qid and triamcinolone topical prescribed  by Dr Evorn Gong

## 2018-09-15 ENCOUNTER — Encounter: Payer: Self-pay | Admitting: Internal Medicine

## 2018-09-15 DIAGNOSIS — L98499 Non-pressure chronic ulcer of skin of other sites with unspecified severity: Secondary | ICD-10-CM | POA: Diagnosis not present

## 2018-09-15 DIAGNOSIS — L309 Dermatitis, unspecified: Secondary | ICD-10-CM | POA: Diagnosis not present

## 2018-09-15 DIAGNOSIS — L298 Other pruritus: Secondary | ICD-10-CM | POA: Diagnosis not present

## 2018-09-15 DIAGNOSIS — L299 Pruritus, unspecified: Secondary | ICD-10-CM | POA: Diagnosis not present

## 2018-09-24 ENCOUNTER — Telehealth: Payer: Self-pay | Admitting: Internal Medicine

## 2018-09-24 ENCOUNTER — Encounter: Payer: Self-pay | Admitting: Internal Medicine

## 2018-09-24 DIAGNOSIS — L298 Other pruritus: Secondary | ICD-10-CM | POA: Diagnosis not present

## 2018-09-24 NOTE — Telephone Encounter (Signed)
Insurance verification for Prolia filed on [employer] Portal. 

## 2018-10-02 ENCOUNTER — Ambulatory Visit (INDEPENDENT_AMBULATORY_CARE_PROVIDER_SITE_OTHER): Payer: Medicare HMO | Admitting: Family Medicine

## 2018-10-02 ENCOUNTER — Other Ambulatory Visit: Payer: Self-pay

## 2018-10-02 ENCOUNTER — Telehealth: Payer: Self-pay

## 2018-10-02 DIAGNOSIS — L299 Pruritus, unspecified: Secondary | ICD-10-CM

## 2018-10-02 DIAGNOSIS — R634 Abnormal weight loss: Secondary | ICD-10-CM | POA: Diagnosis not present

## 2018-10-02 DIAGNOSIS — L309 Dermatitis, unspecified: Secondary | ICD-10-CM

## 2018-10-02 NOTE — Telephone Encounter (Signed)
Copied from [place] [id number]. Topic: General - Other >> Oct 02, 2018  2:46 PM Leward Quan A wrote: Reason for CRM: Patient called to say that the itching is unbearable and that the hydrOXYzine (ATARAX/VISTARIL) 50 MG tablet is not working to stop the itching. Asking for some help please. Ph# [phone]

## 2018-10-02 NOTE — Telephone Encounter (Signed)
Pt c/o of severe itching.  Pt. Said that itching has been going on for 2 years but has progressively gotten worse.  Pt said her skin is very red and a little warm to touch.  Pt said that she is bleeding from scratching so much and is not able to sleep at night.    Pt said that Dr. Derrel Nip is aware and referred her to Dr. Evorn Gong. Pt said that she has seen Dr. Evorn Gong 3 times and he has done 2 biopsies and some blood work.    Pt said that she has contacted Dr. Evorn Gong about itching previously and he prescribed the Kenalog cream.  Pt said that Dr. Evorn Gong told her she would need to contact her PCP for anything further.     Pt said that Kenalog cream helped heal some of the areas but didn't help with itching.  Pt said that she is also taking hydroxyzine which is helps but not completely relieves the itching.  Pt said that she was once on prednisone.    Pt also said that she has lost about 17 lbs in the last year and that Dr. Derrel Nip is aware of the weight loss as well.  Informed pt that Dr. Derrel Nip is out-of-the-office this afternoon and tomorrow and notes will be sent to the covering provider for review.  Pt prefers [facility] if provider decides to call in another medication.

## 2018-10-02 NOTE — Telephone Encounter (Signed)
Sorry Dr. Dianna Limbo is doing a video visit.

## 2018-10-02 NOTE — Telephone Encounter (Signed)
Reviewed.  Itching for two years.  Has tried prednisone, steroid cream and hydroxyzine - with no resolution - may need to be reevaluated.  Please call and confirm if any acute change.

## 2018-10-02 NOTE — Progress Notes (Signed)
Patient ID: Sonya Davis, female   DOB: November 30, 1945, 73 y.o.   MRN: [id number]    Virtual Visit via phone Note  This visit type was conducted due to national recommendations for restrictions regarding the COVID-19 pandemic (e.g. social distancing).  This format is felt to be most appropriate for this patient at this time.  All issues noted in this document were discussed and addressed.  No physical exam was performed (except for noted visual exam findings with Video Visits).   I connected with Meredith Staggers today at  4:00 PM EDT by a video enabled telemedicine application or telephone and verified that I am speaking with the correct person using two identifiers. Location patient: home Location provider: work or home office Persons participating in the virtual visit: patient, provider  I discussed the limitations, risks, security and privacy concerns of performing an evaluation and management service by telephone and the availability of in person appointments. I also discussed with the patient that there may be a patient responsible charge related to this service. The patient expressed understanding and agreed to proceed.  Interactive audio and video telecommunications were attempted between this provider and patient, however failed, due to patient having technical difficulties with loading app, finished visit via phone.    HPI:  Patient and I connected via telephone after failed video connection attempt.  Patient is complaint is continued dermatitis and chronic itching.  Patient did see dermatology, Dr Evorn Gong,  in June and was given triamcinolone and was also advised by Dr. Derrel Nip at the end of June to continue doxepin 3 times daily or 4 times daily as needed for itching.  Patient did have a biopsy done by dermatology, and able to access the results of the biopsy in epic.  Per patient she was told she is "severely anemic" and that the chronic itching could be related to a "underlying cancer."   Patient states dermatology recommended she follow back up with primary care for follow-up.  Patient states she is frustrated with her chronic itching and was for a day without itching.  Patient also complains of continued unintentional weight loss.  States a couple of days ago she weighed 99 pounds, and this morning she weighed 101 pounds.  States she eats like a horse and does not understand why she cannot gain weight.  Denies fever or chills.  Denies cough, shortness of breath or wheezing.  Denies any visible redness or rash on skin at this time.  Denies GI or GU complaints.   ROS: See pertinent positives and negatives per HPI.  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   Fracture closed, pubis ([place]) 1981   Fracture of bone of left shoulder 1981   Fracture of maxilla ([place]) 1981   Gastritis    GERD (gastroesophageal reflux disease)    History of ovarian cyst    History of pericarditis 1988   occurred after flu shot, with pneumonia   Osteoporosis    Ovarian cyst    right, endometriosis   Pericarditis    does not receive flu vaccine becuase of this   Rheumatoid arthritis(714.0) 2000   per patient by labs    Past Surgical History:  Procedure Laterality Date   ABDOMINAL HYSTERECTOMY     secondary to endometriosis   ANTERIOR CRUCIATE LIGAMENT REPAIR  age 49   left   APPENDECTOMY     at age 50   ARTHROSCOPIC REPAIR ACL  1999   left knee   CARPAL TUNNEL RELEASE     bilateral surgeries   CATARACT EXTRACTION  Mar 2013   right eye, [place]   EYE SURGERY  2005   left cataract    OOPHORECTOMY     septoplasty  Dec 2006   [place] [place],    [place]  2006    Family History    Problem Relation Age of Onset   Cancer Mother        lung   Heart disease Father    Crohn's disease Sister    Breast cancer Cousin    Colon cancer Neg Hx    Social History   Tobacco Use   Smoking status: Former Smoker    Types: Cigarettes    Quit date: 02/11/2001    Years since quitting: 17.6   Smokeless tobacco: Never Used  Substance Use Topics   Alcohol use: Yes    Alcohol/week: 5.0 standard drinks    Types: 5 Standard drinks or equivalent per week    Current Outpatient Medications:    albuterol (PROVENTIL) (2.5 MG/3ML) 0.083% nebulizer solution, Take 3 mLs (2.5 mg total) by nebulization every 6 (six) hours as needed for wheezing or shortness of breath., Disp: 75 mL, Rfl: 0   ALPRAZolam (XANAX) 0.5 MG tablet, TAKE ONE TABLET AT BED- TIME AS NEEDED., Disp: 30 tablet, Rfl: 5   amLODipine (NORVASC) 5 MG tablet, Take 1 tablet (5 mg total) by mouth daily., Disp: 90 tablet, Rfl: 1   amphetamine-dextroamphetamine (ADDERALL) 15 MG tablet, Take 1 tablet by mouth 2 (two) times daily., Disp: 60 tablet, Rfl: 0   atorvastatin (LIPITOR) 20 MG tablet, Take 1 tablet (20 mg total) by mouth daily., Disp: 90 tablet, Rfl: 0   calcium carbonate (OS-CAL) 600 MG TABS, Take 600 mg by mouth 2 (two) times daily with a meal.  , Disp: , Rfl:    cetirizine (ZYRTEC) 10 MG tablet, Take 10 mg by mouth daily., Disp: , Rfl:    cholecalciferol (VITAMIN D) 1000 UNITS tablet, Take 1,000 Units by mouth daily.  , Disp: , Rfl:    cyclobenzaprine (FLEXERIL) 10 MG tablet, Take 1 tablet (10 mg total) by mouth 3 (three) times daily as needed for muscle spasms., Disp: 20 tablet, Rfl: 0   Docusate Calcium (STOOL SOFTENER PO), Take by mouth.  , Disp: , Rfl:    doxepin (SINEQUAN) 25 MG capsule, , Disp: , Rfl:    doxycycline (VIBRA-TABS) 100 MG tablet, Take 1 tablet (100 mg total) by mouth 2 (two) times daily., Disp: 20 tablet, Rfl: 0   fexofenadine (ALLEGRA) 180 MG tablet, Take 1 tablet (180 mg total) by  mouth daily., Disp: 90 tablet, Rfl: 1   Fluticasone-Salmeterol (ADVAIR DISKUS) 250-50 MCG/DOSE AEPB, INHALE ONE PUFF INTO LUNGS TWICE DAILY, Disp: 60 each, Rfl: 0   furosemide (LASIX) 20 MG tablet, Take 1 tablet (20 mg total) by mouth daily as needed., Disp: 30 tablet, Rfl: 0   HYDROcodone-acetaminophen (NORCO) 10-325 MG tablet, Take 1 tablet by mouth at bedtime and may repeat dose one time if needed. For severe pain, Disp: 45 tablet, Rfl: 0   HYDROcodone-acetaminophen (NORCO) 10-325 MG tablet, Take 1 tablet by mouth at bedtime and  may repeat dose one time if needed. For severe pain, Disp: 45 tablet, Rfl: 0   hydrOXYzine (ATARAX/VISTARIL) 50 MG tablet, Take 1 tablet (50 mg total) by mouth 3 (three) times daily as needed., Disp: 90 tablet, Rfl: 3   lidocaine-prilocaine (EMLA) cream, Apply 1 application topically as needed. Lidocaine 2.26%/prilocaine 2.26%/meloxicam 0.0875% cream COMPOUNDED, Disp: 5800 g, Rfl: 3   metoprolol succinate (TOPROL-XL) 25 MG 24 hr tablet, Take 1 tablet (25 mg total) by mouth daily., Disp: 90 tablet, Rfl: 3   mirtazapine (REMERON) 15 MG tablet, Take 1 tablet (15 mg total) by mouth at bedtime., Disp: 90 tablet, Rfl: 1   montelukast (SINGULAIR) 10 MG tablet, Take 1 tablet (10 mg total) by mouth at bedtime., Disp: 30 tablet, Rfl: 3   PROAIR HFA 108 (90 Base) MCG/ACT inhaler, TAKE TWO PUFFS EVERY 6 HOURS AS NEEDED FOR WHEEZE OR SHORTNESS OF BREATH., Disp: 8.5 g, Rfl: 0   silver sulfADIAZINE (SILVADENE) 1 % cream, Apply 1 application topically daily., Disp: 50 g, Rfl: 0   traMADol (ULTRAM) 50 MG tablet, Take 2 tablets (100 mg total) by mouth every 6 (six) hours as needed. May refill on or after  Apr 26 2017, Disp: 240 tablet, Rfl: 5   traZODone (DESYREL) 50 MG tablet, Take 0.5-1 tablets (25-50 mg total) by mouth at bedtime., Disp: 90 tablet, Rfl: 0   triamcinolone cream (KENALOG) 0.1 %, Apply 1 application topically 2 (two) times daily., Disp: 30 g, Rfl: 2    triamcinolone ointment (KENALOG) 0.1 %, , Disp: , Rfl:    montelukast (SINGULAIR) 10 MG tablet, Take 1 tablet (10 mg total) by mouth daily., Disp: 30 tablet, Rfl: 5  EXAM:  GENERAL: alert, oriented, sounds in no acute distress  LUNGS: Speaking in full sentences, no signs of respiratory distress, breathing rate appears normal, no obvious gross SOB, gasping or wheezing  PSYCH/NEURO: pleasant and cooperative, no obvious depression or anxiety, speech and thought processing grossly intact  ASSESSMENT AND PLAN:  Discussed the following assessment and plan:  Dermatitis/itching- this is chronic for patient.  Advised to continue doxepin to 3 times daily or 4 times daily as needed for itching and triamcinolone as recommended by dermatology.  Also suggested we can consider a referral to a different dermatologist to see of a second opinion could be helpful and chronic dermatitis/chronic itching treatment.  Unintentional weight loss-unclear cause for this.  Further work up needed.   We will plan to have patient come in the clinic for repeat labs.   I discussed the assessment and treatment plan with the patient. The patient was provided an opportunity to ask questions and all were answered. The patient agreed with the plan and demonstrated an understanding of the instructions.   The patient was advised to call back or seek an in-person evaluation if the symptoms worsen or if the condition fails to improve as anticipated.  I provided 15 minutes of non-face-to-face time during this encounter.   Jodelle Green, FNP

## 2018-10-03 ENCOUNTER — Other Ambulatory Visit: Payer: Medicare HMO

## 2018-10-03 NOTE — Addendum Note (Signed)
Addended by: Leeanne Rio on: 10/03/2018 01:55 PM   Modules accepted: Orders

## 2018-10-14 ENCOUNTER — Other Ambulatory Visit: Payer: Self-pay | Admitting: Internal Medicine

## 2018-10-14 ENCOUNTER — Telehealth: Payer: Self-pay

## 2018-10-14 ENCOUNTER — Other Ambulatory Visit: Payer: Self-pay | Admitting: Family Medicine

## 2018-10-14 DIAGNOSIS — L309 Dermatitis, unspecified: Secondary | ICD-10-CM

## 2018-10-14 DIAGNOSIS — L299 Pruritus, unspecified: Secondary | ICD-10-CM

## 2018-10-14 NOTE — Telephone Encounter (Signed)
Refilled: 09/04/2018 Last OV: 10/02/2018 Next OV: not scheduled

## 2018-10-14 NOTE — Telephone Encounter (Signed)
Copied from B and E [id number]. Topic: General - Inquiry >> Oct 14, 2018 12:22 PM Sonya Davis, NT wrote: Reason for CRM: Patient called in stating she is losing weight again and is 99lbs. Patient is also requesting a call back to see what tests need to be done. Please advise and call back is [phone].

## 2018-10-14 NOTE — Telephone Encounter (Signed)
Pt said that she once weighed around 117 lbs in January. Pt says that she now weighs 99 lbs.  Pt says that she is hungry all the time but she says that everything tastes sour except for sweet foods.  Pt c/o of being tired constantly.  Pt said that she still has an itchy back rash.  Pt said that Dr. Derrel Nip referred her to Dr. Evorn Gong and she had 2 biopsies.  Pt said that she was only told by Dr. Roosvelt Harps office that she is severely anemic.    Pt is concerned about weight loss and wants to know what to do next.  Pt scheduled a virtual visit w/ PCP on 10/16/18 @ 10:00 am.

## 2018-10-15 ENCOUNTER — Other Ambulatory Visit: Payer: Self-pay | Admitting: Internal Medicine

## 2018-10-15 MED ORDER — AMPHETAMINE-DEXTROAMPHETAMINE 10 MG PO TABS: 10.0000 mg | ORAL_TABLET | Freq: Two times a day (BID) | ORAL | 0 refills | Status: DC

## 2018-10-15 NOTE — Telephone Encounter (Signed)
Pt is scheduled for a virtual visit on 10/16/2018.

## 2018-10-15 NOTE — Progress Notes (Signed)
adderall dose reduced to 10 mg given ongoing weight loss

## 2018-10-15 NOTE — Telephone Encounter (Signed)
Did she plan on getting the labs that ii ordered n march done prior to visit ?

## 2018-10-16 ENCOUNTER — Telehealth: Payer: Self-pay | Admitting: Internal Medicine

## 2018-10-16 ENCOUNTER — Ambulatory Visit (INDEPENDENT_AMBULATORY_CARE_PROVIDER_SITE_OTHER): Payer: Medicare HMO | Admitting: Internal Medicine

## 2018-10-16 ENCOUNTER — Encounter: Payer: Self-pay | Admitting: Internal Medicine

## 2018-10-16 ENCOUNTER — Other Ambulatory Visit: Payer: Self-pay

## 2018-10-16 VITALS — Ht 63.0 in | Wt 99.8 lb

## 2018-10-16 DIAGNOSIS — Z862 Personal history of diseases of the blood and blood-forming organs and certain disorders involving the immune mechanism: Secondary | ICD-10-CM | POA: Diagnosis not present

## 2018-10-16 DIAGNOSIS — L282 Other prurigo: Secondary | ICD-10-CM

## 2018-10-16 DIAGNOSIS — R634 Abnormal weight loss: Secondary | ICD-10-CM | POA: Diagnosis not present

## 2018-10-16 DIAGNOSIS — E0590 Thyrotoxicosis, unspecified without thyrotoxic crisis or storm: Secondary | ICD-10-CM | POA: Diagnosis not present

## 2018-10-16 DIAGNOSIS — R636 Underweight: Secondary | ICD-10-CM

## 2018-10-16 DIAGNOSIS — F909 Attention-deficit hyperactivity disorder, unspecified type: Secondary | ICD-10-CM

## 2018-10-16 DIAGNOSIS — Z8742 Personal history of other diseases of the female genital tract: Secondary | ICD-10-CM

## 2018-10-16 DIAGNOSIS — R69 Illness, unspecified: Secondary | ICD-10-CM | POA: Diagnosis not present

## 2018-10-16 NOTE — Progress Notes (Signed)
Telephone  Note  This visit type was conducted due to national recommendations for restrictions regarding the COVID-19 pandemic (e.g. social distancing).  This format is felt to be most appropriate for this patient at this time.  All issues noted in this document were discussed and addressed.  No physical exam was performed (except for noted visual exam findings with Video Visits).   I connected with@ on 10/16/18 at 10:00 AM EDT by telephone and  verified that I am speaking with the correct person using two identifiers. Location patient: home Location provider: work or home office Persons participating in the virtual visit: patient, provider  I discussed the limitations, risks, security and privacy concerns of performing an evaluation and management service by telephone and the availability of in person appointments. I also discussed with the patient that there may be a patient responsible charge related to this service. The patient expressed understanding and agreed to proceed.   Reason for visit: continued weight loss  HPI:  73 yr old female with history of chronic  pain and ADD managed with opioids and adderall presents with an unintentional weight loss of 17 lbs over the past 6 months .  She reports that she has a very good appetite,  And has been supplementing her diet with protein shakes and nuts but continues to lose weight. She denies abdominal pain, nausea,  Changes in bowel movements and Hematochezia .  At her last visit one month ago her adderall dose was reduced given the lack of available alternative etiologies and normal labs including thyroid   2) chronic persistent pruritus ; she has reportedly had 2 skin biopsies by Dr Evorn Gong .  available reports reviewed . The diagnosis of allergic contact dermatitis was made in 2018 and topical steroids were prescribed.    ROS: See pertinent positives and negatives per HPI.  Past Medical History:  Diagnosis Date  . ADD (attention deficit  disorder)   . Allergy    seasonal  . Cataract    surgery to remove them  . Endometriosis of ovary    prior GYN [name]  . 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 Fracture closed, pubis ([place]) 1981  . Fracture of bone of left shoulder 1981  . Fracture of maxilla ([place]) 1981  . Gastritis   . GERD (gastroesophageal reflux disease)   . History of ovarian cyst   . History of pericarditis 1988   occurred after flu shot, with pneumonia  . Osteoporosis   . Ovarian cyst    right, endometriosis  . Pericarditis    does not receive flu vaccine becuase of this  . Rheumatoid arthritis(714.0) 2000   per patient by labs    Past Surgical History:  Procedure Laterality Date  . ABDOMINAL HYSTERECTOMY     secondary to endometriosis  . ANTERIOR CRUCIATE LIGAMENT REPAIR  age 68   left  . APPENDECTOMY     at age 54  . ARTHROSCOPIC REPAIR ACL  1999   left knee  . CARPAL TUNNEL RELEASE     bilateral surgeries  . CATARACT EXTRACTION  Mar 2013   right eye, [place]  . CYSTECTOMY  1963  . EYE SURGERY  2005   left cataract   . OOPHORECTOMY    . septoplasty  Dec 2006   [name],   . SEPTOPLASTY  2006    Family History  Problem Relation Age of Onset  .  Cancer Mother        lung  . Heart disease Father   . Crohn's disease Sister   . Breast cancer Cousin   . Colon cancer Neg Hx     SOCIAL HX:  reports that she quit smoking about 17 years ago. Her smoking use included cigarettes. She has never used smokeless tobacco. She reports current alcohol use of about 5.0 standard drinks of alcohol per week. She reports that she does not use drugs.   Current Outpatient Medications:  .  albuterol (PROVENTIL) (2.5 MG/3ML) 0.083% nebulizer solution, Take 3 mLs (2.5 mg total) by nebulization every 6 (six) hours as needed for wheezing or shortness of breath., Disp: 75 mL, Rfl: 0 .  ALPRAZolam (XANAX) 0.5 MG tablet, TAKE ONE TABLET  AT BED- TIME AS NEEDED., Disp: 30 tablet, Rfl: 5 .  amLODipine (NORVASC) 5 MG tablet, Take 1 tablet (5 mg total) by mouth daily., Disp: 90 tablet, Rfl: 1 .  amphetamine-dextroamphetamine (ADDERALL) 10 MG tablet, Take 1 tablet (10 mg total) by mouth 2 (two) times daily., Disp: 60 tablet, Rfl: 0 .  atorvastatin (LIPITOR) 20 MG tablet, Take 1 tablet (20 mg total) by mouth daily., Disp: 90 tablet, Rfl: 0 .  calcium carbonate (OS-CAL) 600 MG TABS, Take 600 mg by mouth 2 (two) times daily with a meal.  , Disp: , Rfl:  .  cetirizine (ZYRTEC) 10 MG tablet, Take 10 mg by mouth daily., Disp: , Rfl:  .  cholecalciferol (VITAMIN D) 1000 UNITS tablet, Take 1,000 Units by mouth daily.  , Disp: , Rfl:  .  cyclobenzaprine (FLEXERIL) 10 MG tablet, Take 1 tablet (10 mg total) by mouth 3 (three) times daily as needed for muscle spasms., Disp: 20 tablet, Rfl: 0 .  Docusate Calcium (STOOL SOFTENER PO), Take by mouth.  , Disp: , Rfl:  .  doxepin (SINEQUAN) 25 MG capsule, , Disp: , Rfl:  .  doxycycline (VIBRA-TABS) 100 MG tablet, Take 1 tablet (100 mg total) by mouth 2 (two) times daily., Disp: 20 tablet, Rfl: 0 .  fexofenadine (ALLEGRA) 180 MG tablet, Take 1 tablet (180 mg total) by mouth daily., Disp: 90 tablet, Rfl: 1 .  Fluticasone-Salmeterol (ADVAIR DISKUS) 250-50 MCG/DOSE AEPB, INHALE ONE PUFF INTO LUNGS TWICE DAILY, Disp: 60 each, Rfl: 0 .  furosemide (LASIX) 20 MG tablet, Take 1 tablet (20 mg total) by mouth daily as needed., Disp: 30 tablet, Rfl: 0 .  HYDROcodone-acetaminophen (NORCO) 10-325 MG tablet, Take 1 tablet by mouth at bedtime and may repeat dose one time if needed. For severe pain, Disp: 45 tablet, Rfl: 0 .  HYDROcodone-acetaminophen (NORCO) 10-325 MG tablet, Take 1 tablet by mouth at bedtime and may repeat dose one time if needed. For severe pain, Disp: 45 tablet, Rfl: 0 .  hydrOXYzine (ATARAX/VISTARIL) 50 MG tablet, Take 1 tablet (50 mg total) by mouth 3 (three) times daily as needed., Disp: 90 tablet,  Rfl: 3 .  lidocaine-prilocaine (EMLA) cream, Apply 1 application topically as needed. Lidocaine 2.26%/prilocaine 2.26%/meloxicam 0.0875% cream COMPOUNDED, Disp: 5800 g, Rfl: 3 .  metoprolol succinate (TOPROL-XL) 25 MG 24 hr tablet, Take 1 tablet (25 mg total) by mouth daily., Disp: 90 tablet, Rfl: 3 .  mirtazapine (REMERON) 15 MG tablet, Take 1 tablet (15 mg total) by mouth at bedtime., Disp: 90 tablet, Rfl: 1 .  montelukast (SINGULAIR) 10 MG tablet, Take 1 tablet (10 mg total) by mouth daily., Disp: 30 tablet, Rfl: 5 .  montelukast (SINGULAIR) 10 MG  tablet, TAKE ONE TABLET BY MOUTH AT BEDTIME, Disp: 30 tablet, Rfl: 3 .  PROAIR HFA 108 (90 Base) MCG/ACT inhaler, TAKE TWO PUFFS EVERY 6 HOURS AS NEEDED FOR WHEEZE OR SHORTNESS OF BREATH., Disp: 8.5 g, Rfl: 0 .  silver sulfADIAZINE (SILVADENE) 1 % cream, Apply 1 application topically daily., Disp: 50 g, Rfl: 0 .  traMADol (ULTRAM) 50 MG tablet, Take 2 tablets (100 mg total) by mouth every 6 (six) hours as needed. May refill on or after  Apr 26 2017, Disp: 240 tablet, Rfl: 5 .  traZODone (DESYREL) 50 MG tablet, Take 0.5-1 tablets (25-50 mg total) by mouth at bedtime., Disp: 90 tablet, Rfl: 0 .  triamcinolone cream (KENALOG) 0.1 %, Apply 1 application topically 2 (two) times daily., Disp: 30 g, Rfl: 2 .  triamcinolone ointment (KENALOG) 0.1 %, , Disp: , Rfl:   EXAM:  VITALS per patient if applicable:  GENERAL: alert, oriented, appears well and in no acute distress  HEENT: atraumatic, conjunttiva clear, no obvious abnormalities on inspection of external nose and ears  NECK: normal movements of the head and neck  LUNGS: on inspection no signs of respiratory distress, breathing rate appears normal, no obvious gross SOB, gasping or wheezing  CV: no obvious cyanosis  MS: moves all visible extremities without noticeable abnormality  PSYCH/NEURO: pleasant and cooperative, no obvious depression or anxiety, speech and thought processing grossly  intact  ASSESSMENT AND PLAN:  History of ovarian cyst S/p oophorectomy , details of surgery are unclear  Attention deficit disorder of adult with hyperactivity I will continue to reduce her dose gradually given her unexplained weight loss.   Weight loss Although she is up to date on screening ,  Her weight loss raises Concern for occult malignancy vs medication side effect  (adderall). Thyroid function is normal . She has normal protein stores and normal iron bt she has a microcytic anemia. Referring to [facility]  Chronic papular urticaria Awaiting records from dermatology .  Previous records suggest allergic contact dermatitis but she states that the topical steroids and second generation antihistamines have not helped. Referral for 2nd opinion offered     I discussed the assessment and treatment plan with the patient. The patient was provided an opportunity to ask questions and all were answered. The patient agreed with the plan and demonstrated an understanding of the instructions.   The patient was advised to call back or seek an in-person evaluation if the symptoms worsen or if the condition fails to improve as anticipated.  I provided 22 minutes of non-face-to-face time during this encounter.   Crecencio Mc, MD

## 2018-10-16 NOTE — Telephone Encounter (Signed)
Pt is scheduled for labs tomorrow at 2:45pm.

## 2018-10-17 ENCOUNTER — Other Ambulatory Visit (INDEPENDENT_AMBULATORY_CARE_PROVIDER_SITE_OTHER): Payer: Medicare HMO

## 2018-10-17 DIAGNOSIS — L309 Dermatitis, unspecified: Secondary | ICD-10-CM | POA: Diagnosis not present

## 2018-10-17 DIAGNOSIS — R634 Abnormal weight loss: Secondary | ICD-10-CM | POA: Diagnosis not present

## 2018-10-17 DIAGNOSIS — L299 Pruritus, unspecified: Secondary | ICD-10-CM

## 2018-10-17 DIAGNOSIS — Z862 Personal history of diseases of the blood and blood-forming organs and certain disorders involving the immune mechanism: Secondary | ICD-10-CM | POA: Diagnosis not present

## 2018-10-18 ENCOUNTER — Encounter: Payer: Self-pay | Admitting: Internal Medicine

## 2018-10-18 DIAGNOSIS — R634 Abnormal weight loss: Secondary | ICD-10-CM | POA: Insufficient documentation

## 2018-10-18 LAB — URINALYSIS, ROUTINE W REFLEX MICROSCOPIC
Bacteria, UA: NONE SEEN /HPF
Bilirubin Urine: NEGATIVE
Glucose, UA: NEGATIVE
Hgb urine dipstick: NEGATIVE
Hyaline Cast: NONE SEEN /LPF
Ketones, ur: NEGATIVE
Leukocytes,Ua: NEGATIVE
Nitrite: NEGATIVE
Specific Gravity, Urine: 1.02 (ref 1.001–1.03)
pH: 6 (ref 5.0–8.0)

## 2018-10-18 LAB — COMPREHENSIVE METABOLIC PANEL
AG Ratio: 2.3 (calc) (ref 1.0–2.5)
ALT: 20 U/L (ref 6–29)
AST: 26 U/L (ref 10–35)
Albumin: 4.1 g/dL (ref 3.6–5.1)
Alkaline phosphatase (APISO): 86 U/L (ref 37–153)
BUN: 22 mg/dL (ref 7–25)
CO2: 22 mmol/L (ref 20–32)
Calcium: 9.6 mg/dL (ref 8.6–10.4)
Chloride: 108 mmol/L (ref 98–110)
Creat: 0.74 mg/dL (ref 0.60–0.93)
Globulin: 1.8 g/dL (calc) — ABNORMAL LOW (ref 1.9–3.7)
Glucose, Bld: 92 mg/dL (ref 65–99)
Potassium: 4 mmol/L (ref 3.5–5.3)
Sodium: 141 mmol/L (ref 135–146)
Total Bilirubin: 0.3 mg/dL (ref 0.2–1.2)
Total Protein: 5.9 g/dL — ABNORMAL LOW (ref 6.1–8.1)

## 2018-10-18 LAB — IRON,TIBC AND FERRITIN PANEL
%SAT: 34 % (calc) (ref 16–45)
Ferritin: 58 ng/mL (ref 16–288)
Iron: 122 ug/dL (ref 45–160)
TIBC: 356 mcg/dL (calc) (ref 250–450)

## 2018-10-18 LAB — CBC WITH DIFFERENTIAL/PLATELET
Absolute Monocytes: 672 cells/uL (ref 200–950)
Basophils Absolute: 71 cells/uL (ref 0–200)
Basophils Relative: 0.9 %
Eosinophils Absolute: 435 cells/uL (ref 15–500)
Eosinophils Relative: 5.5 %
HCT: 34 % — ABNORMAL LOW (ref 35.0–45.0)
Hemoglobin: 10.9 g/dL — ABNORMAL LOW (ref 11.7–15.5)
Lymphs Abs: 1651 cells/uL (ref 850–3900)
MCH: 24.5 pg — ABNORMAL LOW (ref 27.0–33.0)
MCHC: 32.1 g/dL (ref 32.0–36.0)
MCV: 76.6 fL — ABNORMAL LOW (ref 80.0–100.0)
MPV: 9.4 fL (ref 7.5–12.5)
Monocytes Relative: 8.5 %
Neutro Abs: 5072 cells/uL (ref 1500–7800)
Neutrophils Relative %: 64.2 %
Platelets: 328 10*3/uL (ref 140–400)
RBC: 4.44 10*6/uL (ref 3.80–5.10)
RDW: 15.2 % — ABNORMAL HIGH (ref 11.0–15.0)
Total Lymphocyte: 20.9 %
WBC: 7.9 10*3/uL (ref 3.8–10.8)

## 2018-10-18 LAB — HEPATIC FUNCTION PANEL
AG Ratio: 2.3 (calc) (ref 1.0–2.5)
ALT: 20 U/L (ref 6–29)
AST: 26 U/L (ref 10–35)
Albumin: 4.1 g/dL (ref 3.6–5.1)
Alkaline phosphatase (APISO): 86 U/L (ref 37–153)
Bilirubin, Direct: 0.1 mg/dL (ref 0.0–0.2)
Globulin: 1.8 g/dL (calc) — ABNORMAL LOW (ref 1.9–3.7)
Indirect Bilirubin: 0.2 mg/dL (calc) (ref 0.2–1.2)
Total Bilirubin: 0.3 mg/dL (ref 0.2–1.2)
Total Protein: 5.9 g/dL — ABNORMAL LOW (ref 6.1–8.1)

## 2018-10-18 LAB — TSH: TSH: 0.65 mIU/L (ref 0.40–4.50)

## 2018-10-18 NOTE — Assessment & Plan Note (Addendum)
Although she is up to date on screening ,  Her weight loss raises Concern for occult malignancy vs medication side effect  (adderall). Thyroid function is normal . She has normal protein stores and normal iron bt she has a microcytic anemia. Referring to [facility]

## 2018-10-18 NOTE — Assessment & Plan Note (Signed)
Awaiting records from dermatology .  Previous records suggest allergic contact dermatitis but she states that the topical steroids and second generation antihistamines have not helped. Referral for 2nd opinion offered

## 2018-10-18 NOTE — Assessment & Plan Note (Signed)
S/p oophorectomy , details of surgery are unclear

## 2018-10-18 NOTE — Assessment & Plan Note (Signed)
I will continue to reduce her dose gradually given her unexplained weight loss.

## 2018-10-23 ENCOUNTER — Telehealth: Payer: Self-pay | Admitting: Internal Medicine

## 2018-10-23 NOTE — Telephone Encounter (Signed)
Spoke with pt to inform her of her lab results. Pt stated that she has already scheduled an appt with Dr. Raquel James. Pt has also been scheduled for repeat lab work. Pt is aware of appt date and time.

## 2018-10-23 NOTE — Telephone Encounter (Signed)
Patient requesting call back from CMA to discuss lab results from 8/7. She does not have access to MyChart.

## 2018-10-27 ENCOUNTER — Other Ambulatory Visit: Payer: Self-pay

## 2018-10-27 ENCOUNTER — Other Ambulatory Visit (INDEPENDENT_AMBULATORY_CARE_PROVIDER_SITE_OTHER): Payer: Medicare HMO

## 2018-10-27 DIAGNOSIS — R634 Abnormal weight loss: Secondary | ICD-10-CM

## 2018-10-27 DIAGNOSIS — E0590 Thyrotoxicosis, unspecified without thyrotoxic crisis or storm: Secondary | ICD-10-CM

## 2018-10-27 NOTE — Addendum Note (Signed)
Addended byElpidio Galea T on: 10/27/2018 02:34 PM   Modules accepted: Orders

## 2018-10-30 LAB — THYROID PEROXIDASE ANTIBODY: Thyroperoxidase Ab SerPl-aCnc: <1 IU/mL (ref <9)

## 2018-10-30 LAB — HEMOGLOBIN A1C
Hgb A1c MFr Bld: 5.4 % of total Hgb (ref <5.7)
Mean Plasma Glucose: 108 (calc)
eAG (mmol/L): 6 (calc)

## 2018-10-30 LAB — THYROGLOBULIN ANTIBODY: Thyroglobulin Ab: <1 IU/mL (ref <=1)

## 2018-10-30 LAB — THYROID STIMULATING IMMUNOGLOBULIN: TSI: <89 % baseline (ref <140)

## 2018-11-03 ENCOUNTER — Telehealth: Payer: Self-pay | Admitting: Internal Medicine

## 2018-11-03 NOTE — Telephone Encounter (Signed)
Spoke with pt and she has a colonoscopy scheduled for earlier October.

## 2018-11-03 NOTE — Telephone Encounter (Signed)
Spoke with pt and informed her of the mychart messages that Dr. Derrel Nip had sent her.

## 2018-11-03 NOTE — Telephone Encounter (Signed)
Spoke with pt and she stated that the triamcinolone cream does dry up some of the places but it does not help with the itching. Pt is concern that something is causing her to itch and continue to lose weight.

## 2018-11-03 NOTE — Telephone Encounter (Signed)
Patient was sent my chart message about her lab results. Does not look like she has viewed them.

## 2018-11-03 NOTE — Telephone Encounter (Signed)
Patient called and would like to talk to Dr. Derrel Nip about her lab results and also get a copy printed for her to come by the office and get it.

## 2018-11-06 ENCOUNTER — Other Ambulatory Visit: Payer: Self-pay

## 2018-11-06 ENCOUNTER — Ambulatory Visit (INDEPENDENT_AMBULATORY_CARE_PROVIDER_SITE_OTHER): Payer: Medicare HMO

## 2018-11-06 DIAGNOSIS — Z0000 Encounter for general adult medical examination without abnormal findings: Secondary | ICD-10-CM

## 2018-11-06 NOTE — Progress Notes (Addendum)
Subjective:   Sonya Davis is a 73 y.o. female who presents for Medicare Annual (Subsequent) preventive examination.  Review of Systems:  No ROS.  Medicare Wellness Virtual Visit.  Visual/audio telehealth visit, UTA vital signs.   See social history for additional risk factors.   Cardiac Risk Factors include: advanced age (>1men, >17 women);hypertension     Objective:     Vitals: There were no vitals taken for this visit.  There is no height or weight on file to calculate BMI.  Advanced Directives 11/06/2018 11/05/2016 06/03/2015  Does Patient Have a Medical Advance Directive? No Yes No  Type of Advance Directive - [place];Living will [place];Living will  Copy of [place] in Chart? - No - copy requested -  Would patient like information on creating a medical advance directive? No - Patient declined No - Patient declined Yes - Scientist, clinical (histocompatibility and immunogenetics) given    Tobacco Social History   Tobacco Use  Smoking Status Former Smoker  . Types: Cigarettes  . Quit date: 02/11/2001  . Years since quitting: 17.7  Smokeless Tobacco Never Used     Counseling given: Not Answered   Clinical Intake:  Pre-visit preparation completed: Yes        Diabetes: No  How often do you need to have someone help you when you read instructions, pamphlets, or other written materials from your doctor or pharmacy?: 1 - Never  Interpreter Needed?: No     Past Medical History:  Diagnosis Date  . ADD (attention deficit disorder)   . Allergy    seasonal  . Cataract    surgery to remove them  . Endometriosis of ovary    prior GYN [name]  . 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 Fracture closed, pubis ([place]) 1981  . Fracture of bone of left shoulder 1981  . Fracture of maxilla ([place]) 1981  . Gastritis   . GERD (gastroesophageal reflux disease)   . History of  ovarian cyst   . History of pericarditis 1988   occurred after flu shot, with pneumonia  . Osteoporosis   . Ovarian cyst    right, endometriosis  . Pericarditis    does not receive flu vaccine becuase of this  . Rheumatoid arthritis(714.0) 2000   per patient by labs   Past Surgical History:  Procedure Laterality Date  . ABDOMINAL HYSTERECTOMY     secondary to endometriosis  . ANTERIOR CRUCIATE LIGAMENT REPAIR  age 61   left  . APPENDECTOMY     at age 58  . ARTHROSCOPIC REPAIR ACL  1999   left knee  . CARPAL TUNNEL RELEASE     bilateral surgeries  . CATARACT EXTRACTION  Mar 2013   right eye, [place]  . CYSTECTOMY  1963  . EYE SURGERY  2005   left cataract   . OOPHORECTOMY    . septoplasty  Dec 2006   [name],   . SEPTOPLASTY  2006   Family History  Problem Relation Age of Onset  . Cancer Mother        lung  . Heart disease Father   . Crohn's disease Sister   . Breast cancer Cousin   . Colon cancer Neg Hx    Social History   Socioeconomic History  . Marital status: Single    Spouse name: Not on file  . Number of children: Not  on file  . Years of education: Not on file  . Highest education level: Not on file  Occupational History  . Not on file  Social Needs  . Financial resource strain: Not hard at all  . Food insecurity    Worry: Never true    Inability: Never true  . Transportation needs    Medical: No    Non-medical: No  Tobacco Use  . Smoking status: Former Smoker    Types: Cigarettes    Quit date: 02/11/2001    Years since quitting: 17.7  . Smokeless tobacco: Never Used  Substance and Sexual Activity  . Alcohol use: Yes    Alcohol/week: 5.0 standard drinks    Types: 5 Standard drinks or equivalent per week  . Drug use: No  . Sexual activity: Never  Lifestyle  . Physical activity    Days per week: 0 days    Minutes per session: Not on file  . Stress: To some extent  Relationships  . Social Herbalist on phone: Not on  file    Gets together: Not on file    Attends religious service: Not on file    Active member of club or organization: Not on file    Attends meetings of clubs or organizations: Not on file    Relationship status: Not on file  Other Topics Concern  . Not on file  Social History Narrative  . Not on file    Outpatient Encounter Medications as of 11/06/2018  Medication Sig  . albuterol (PROVENTIL) (2.5 MG/3ML) 0.083% nebulizer solution Take 3 mLs (2.5 mg total) by nebulization every 6 (six) hours as needed for wheezing or shortness of breath.  . ALPRAZolam (XANAX) 0.5 MG tablet TAKE ONE TABLET AT BED- TIME AS NEEDED.  [name] Kitchen amLODipine (NORVASC) 5 MG tablet Take 1 tablet (5 mg total) by mouth daily.  [name] Kitchen amphetamine-dextroamphetamine (ADDERALL) 10 MG tablet Take 1 tablet (10 mg total) by mouth 2 (two) times daily.  [name] Kitchen atorvastatin (LIPITOR) 20 MG tablet Take 1 tablet (20 mg total) by mouth daily.  . calcium carbonate (OS-CAL) 600 MG TABS Take 600 mg by mouth 2 (two) times daily with a meal.    . cetirizine (ZYRTEC) 10 MG tablet Take 10 mg by mouth daily.  . cholecalciferol (VITAMIN D) 1000 UNITS tablet Take 1,000 Units by mouth daily.    . cyclobenzaprine (FLEXERIL) 10 MG tablet Take 1 tablet (10 mg total) by mouth 3 (three) times daily as needed for muscle spasms.  [name] Calcium (STOOL SOFTENER PO) Take by mouth.    . doxepin (SINEQUAN) 25 MG capsule   . doxycycline (VIBRA-TABS) 100 MG tablet Take 1 tablet (100 mg total) by mouth 2 (two) times daily.  . fexofenadine (ALLEGRA) 180 MG tablet Take 1 tablet (180 mg total) by mouth daily.  . Fluticasone-Salmeterol (ADVAIR DISKUS) 250-50 MCG/DOSE AEPB INHALE ONE PUFF INTO LUNGS TWICE DAILY  . furosemide (LASIX) 20 MG tablet Take 1 tablet (20 mg total) by mouth daily as needed.  [name] Kitchen HYDROcodone-acetaminophen (NORCO) 10-325 MG tablet Take 1 tablet by mouth at bedtime and may repeat dose one time if needed. For severe pain  . HYDROcodone-acetaminophen  (NORCO) 10-325 MG tablet Take 1 tablet by mouth at bedtime and may repeat dose one time if needed. For severe pain  . hydrOXYzine (ATARAX/VISTARIL) 50 MG tablet Take 1 tablet (50 mg total) by mouth 3 (three) times daily as needed.  . lidocaine-prilocaine (EMLA) cream Apply 1  application topically as needed. Lidocaine 2.26%/prilocaine 2.26%/meloxicam 0.0875% cream COMPOUNDED  . metoprolol succinate (TOPROL-XL) 25 MG 24 hr tablet Take 1 tablet (25 mg total) by mouth daily.  . mirtazapine (REMERON) 15 MG tablet Take 1 tablet (15 mg total) by mouth at bedtime.  . montelukast (SINGULAIR) 10 MG tablet TAKE ONE TABLET BY MOUTH AT BEDTIME  . PROAIR HFA 108 (90 Base) MCG/ACT inhaler TAKE TWO PUFFS EVERY 6 HOURS AS NEEDED FOR WHEEZE OR SHORTNESS OF BREATH.  . silver sulfADIAZINE (SILVADENE) 1 % cream Apply 1 application topically daily.  . traMADol (ULTRAM) 50 MG tablet Take 2 tablets (100 mg total) by mouth every 6 (six) hours as needed. May refill on or after  Apr 26 2017  . traZODone (DESYREL) 50 MG tablet Take 0.5-1 tablets (25-50 mg total) by mouth at bedtime.  . triamcinolone cream (KENALOG) 0.1 % Apply 1 application topically 2 (two) times daily.  [name] Kitchen triamcinolone ointment (KENALOG) 0.1 %   . montelukast (SINGULAIR) 10 MG tablet Take 1 tablet (10 mg total) by mouth daily.   No facility-administered encounter medications on file as of 11/06/2018.     Activities of Daily Living In your present state of health, do you have any difficulty performing the following activities: 11/06/2018  Hearing? Y  Comment Hearing aids  Vision? N  Difficulty concentrating or making decisions? N  Walking or climbing stairs? N  Dressing or bathing? N  Doing errands, shopping? N  Preparing Food and eating ? N  Using the Toilet? N  In the past six months, have you accidently leaked urine? N  Do you have problems with loss of bowel control? N  Managing your Medications? N  Managing your Finances? N  Housekeeping or  managing your Housekeeping? N  Some recent data might be hidden    Patient Care Team: Crecencio Mc, MD as PCP - General (Internal Medicine)    Assessment:   This is a routine wellness examination for Ginnette.  I connected with patient 11/06/18 at 10:00 AM EDT by an audio enabled telemedicine application and verified that I am speaking with the correct person using two identifiers. Patient stated full name and DOB. Patient gave permission to continue with virtual visit. Patient's location was at home and Nurse's location was at [place] office.   Health Maintenance Due: Colonoscopy- scheduled 12/11/18 Update all pending maintenance due as appropriate.   See completed HM at the end of note.   Eye: Visual acuity not assessed. Virtual visit. Wears readers as needed. lenses. Followed by their ophthalmologist every 12 months. Cataract extracted, bilateral.  Dental: Visits every 8-12 months.    Hearing: Hearing aids-yes  Safety:  Patient feels safe at home- yes Patient does have smoke detectors at home- yes Patient does wear sunscreen or protective clothing when in direct sunlight - yes Patient does wear seat belt when in a moving vehicle - yes Patient drives- yes Adequate lighting in walkways free from debris- yes Grab bars and handrails used as appropriate- yes Ambulates with no assistive device  Social: Alcohol intake - yes      Smoking history- former   Smokers in home? none Illicit drug use? none  Depression: PHQ 2 &9 complete. See screening below.   Falls: See screening below.    Medication: Taking as directed and without issues.   Covid-19: Precautions and sickness symptoms discussed. Wears mask, social distancing, hand hygiene as appropriate.   Activities of Daily Living Patient denies needing assistance with: household chores,  feeding themselves, getting from bed to chair, getting to the toilet, bathing/showering, dressing, managing money, or preparing meals.    Memory: Patient is alert. Patient denies difficulty focusing or concentrating. Correctly identified the president of the [place], season and recall.  BMI- discussed the importance of a healthy diet, water intake and the benefits of aerobic exercise.  Educational material provided.  Physical activity- no routine  Diet: regular Water: good intake Caffeine: 1 cup daily Ensure/Protein supplement: yes  Advanced Directive: End of life planning; Advanced aging; Advanced directives discussed.  No HCPOA/Living Will.  Additional information declined at this time.  Other Providers Patient Care Team: Crecencio Mc, MD as PCP - General (Internal Medicine)  Exercise Activities and Dietary recommendations Current Exercise Habits: The patient does not participate in regular exercise at present  Goals      Patient Stated   . Complete Advanced Care Planning (pt-stated)    . DIET - INCREASE WATER INTAKE (pt-stated)       Fall Risk Fall Risk  11/06/2018 10/14/2017 08/01/2016 06/03/2015 02/26/2014  Falls in the past year? 0 No Yes Yes No  Number falls in past yr: - - 1 1 -  Injury with Fall? - - Yes No -  Follow up - - - Education provided;Falls prevention discussed -   Timed Get Up and Go performed: no, virtual visit  Depression Screen PHQ 2/9 Scores 11/06/2018 10/02/2018 10/14/2017 08/01/2016  PHQ - 2 Score 1 0 4 0  PHQ- 9 Score - 0 21 -     Cognitive Function MMSE - Mini Mental State Exam 06/03/2015  Orientation to time 5  Orientation to Place 5  Registration 3  Attention/ Calculation 5  Recall 3  Language- name 2 objects 2  Language- repeat 1  Language- follow 3 step command 3  Language- read & follow direction 1  Write a sentence 1  Copy design 1  Total score 30        Immunization History  Administered Date(s) Administered  . Pneumococcal Conjugate-13 09/17/2014  . Pneumococcal Polysaccharide-23 02/12/2011  . Tdap 02/12/2011  . Zoster 09/03/2012  . Zoster Recombinat  (Shingrix) 01/14/2018, 05/14/2018   Screening Tests Health Maintenance  Topic Date Due  . COLONOSCOPY  02/15/2018  . INFLUENZA VACCINE  03/14/2020 (Originally 10/11/2018)  . MAMMOGRAM  02/12/2020  . TETANUS/TDAP  02/11/2021  . DEXA SCAN  Completed  . Hepatitis C Screening  Completed  . PNA vac Low Risk Adult  Completed      Plan:    Keep all routine maintenance appointments.   Follow up scheduled 11/13/18.  Medicare Attestation I have personally reviewed: The patient's medical and social history Their use of alcohol, tobacco or illicit drugs Their current medications and supplements The patient's functional ability including ADLs,fall risks, home safety risks, cognitive, and hearing and visual impairment Diet and physical activities Evidence for depression   In addition, I have reviewed and discussed with patient certain preventive protocols, quality metrics, and best practice recommendations. A written personalized care plan for preventive services as well as general preventive health recommendations were provided to patient.     OBrien-Blaney, Kaleya Douse L, LPN  [id number]    I have reviewed the above information and agree with above.   Deborra Medina, MD

## 2018-11-06 NOTE — Patient Instructions (Addendum)
  Ms. Sonya Davis , Thank you for taking time to come for your Medicare Wellness Visit. I appreciate your ongoing commitment to your health goals. Please review the following plan we discussed and let me know if I can assist you in the future.   These are the goals we discussed: Goals      Patient Stated   . Complete Advanced Care Planning (pt-stated)    . DIET - INCREASE WATER INTAKE (pt-stated)       This is a list of the screening recommended for you and due dates:  Health Maintenance  Topic Date Due  . Colon Cancer Screening  02/15/2018  . Flu Shot  03/14/2020*  . Mammogram  02/12/2020  . Tetanus Vaccine  02/11/2021  . DEXA scan (bone density measurement)  Completed  .  Hepatitis C: One time screening is recommended by Center for Disease Control  (CDC) for  adults born from [birth year] through 1965.   Completed  . Pneumonia vaccines  Completed  *Topic was postponed. The date shown is not the original due date.

## 2018-11-10 ENCOUNTER — Other Ambulatory Visit: Payer: Self-pay | Admitting: Internal Medicine

## 2018-11-11 ENCOUNTER — Telehealth: Payer: Self-pay | Admitting: Internal Medicine

## 2018-11-11 NOTE — Telephone Encounter (Signed)
Refilled: 10/15/2018 Last OV: 10/16/2018 Next OV: 11/13/2018

## 2018-11-11 NOTE — Telephone Encounter (Signed)
Pt called about having a red and blistery streak around her right ankle going up to her knee. Pt wants to come in. I explained to pt that due to covid 19 symptoms she cannot come in and I cannot make that decision only the doctor can clear her to come in. Please advise? Thank you!  I was going to schedule her with [facility] for today virtually.

## 2018-11-12 NOTE — Telephone Encounter (Signed)
Patient called to speak with Janett Billow.  She stated that she has a few questions for her.  CB# [phone]

## 2018-11-12 NOTE — Telephone Encounter (Signed)
Spoke with pt and she stated that her leg is a little better this morning. I offered the pt a virtual visit this morning and she declined stating that she already has an appt tomorrow morning.

## 2018-11-13 ENCOUNTER — Encounter: Payer: Self-pay | Admitting: Internal Medicine

## 2018-11-13 ENCOUNTER — Other Ambulatory Visit: Payer: Self-pay

## 2018-11-13 ENCOUNTER — Ambulatory Visit (INDEPENDENT_AMBULATORY_CARE_PROVIDER_SITE_OTHER): Payer: Medicare HMO | Admitting: Internal Medicine

## 2018-11-13 DIAGNOSIS — R634 Abnormal weight loss: Secondary | ICD-10-CM

## 2018-11-13 DIAGNOSIS — L282 Other prurigo: Secondary | ICD-10-CM

## 2018-11-13 DIAGNOSIS — L299 Pruritus, unspecified: Secondary | ICD-10-CM | POA: Diagnosis not present

## 2018-11-13 DIAGNOSIS — F909 Attention-deficit hyperactivity disorder, unspecified type: Secondary | ICD-10-CM

## 2018-11-13 DIAGNOSIS — R69 Illness, unspecified: Secondary | ICD-10-CM | POA: Diagnosis not present

## 2018-11-13 DIAGNOSIS — L309 Dermatitis, unspecified: Secondary | ICD-10-CM | POA: Diagnosis not present

## 2018-11-13 MED ORDER — CEPHALEXIN 500 MG PO CAPS: 500.0000 mg | ORAL_CAPSULE | Freq: Four times a day (QID) | ORAL | 0 refills | Status: DC

## 2018-11-13 MED ORDER — HYDROXYZINE HCL 50 MG PO TABS: 50.0000 mg | ORAL_TABLET | Freq: Four times a day (QID) | ORAL | 3 refills | Status: DC | PRN

## 2018-11-13 MED ORDER — QUETIAPINE FUMARATE 25 MG PO TABS: 25.0000 mg | ORAL_TABLET | Freq: Every day | ORAL | 1 refills | Status: DC

## 2018-11-13 MED ORDER — TRAMADOL HCL 50 MG PO TABS: 50.0000 mg | ORAL_TABLET | Freq: Four times a day (QID) | ORAL | 0 refills | Status: AC | PRN

## 2018-11-13 NOTE — Telephone Encounter (Signed)
Pt spoke with Dr. Derrel Nip this morning during her virtual visit.

## 2018-11-13 NOTE — Progress Notes (Signed)
Virtual Visit [email or web address]  This visit type was conducted due to national recommendations for restrictions regarding the COVID-19 pandemic (e.g. social distancing).  This format is felt to be most appropriate for this patient at this time.  All issues noted in this document were discussed and addressed.  No physical exam was performed (except for noted visual exam findings with Video Visits).   I connected with@ on 11/13/18 at  9:30 AM EDT by a video enabled telemedicine application  and verified that I am speaking with the correct person using two identifiers. Location patient: home Location provider: work or home office Persons participating in the virtual visit: patient, provider  I discussed the limitations, risks, security and privacy concerns of performing an evaluation and management service by telephone and the availability of in person appointments. I also discussed with the patient that there may be a patient responsible charge related to this service. The patient expressed understanding and agreed to proceed.  Reason for visit: follow up on chronic issues  HPI:  1) ongoing weight loss;  Gi referral for colonoscopy was made last month and her appt is sept 8   2) persistent urticaria: all biopsies by Dr Evorn Gong were negative for pathology .  Possibility of bedbug infestation exists given the condition of her home as viewed today during her virtual visit.   3)  Chronic pain : using vicodin sparingly , not more than once daily  .  Has not refilled tramadol since jan 2019  House is disheveled.  Stacks and stacks of papers  Clothes , pill bottles.     ROS: See pertinent positives and negatives per HPI.  Past Medical History:  Diagnosis Date  . ADD (attention deficit disorder)   . Allergy    seasonal  . Cataract    surgery to remove them  . Endometriosis of ovary    prior GYN [name]  . 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 Fracture closed, pubis ([place]) 1981  . Fracture of bone of left shoulder 1981  . Fracture of maxilla ([place]) 1981  . Gastritis   . GERD (gastroesophageal reflux disease)   . History of ovarian cyst   . History of pericarditis 1988   occurred after flu shot, with pneumonia  . Osteoporosis   . Ovarian cyst    right, endometriosis  . Pericarditis    does not receive flu vaccine becuase of this  . Rheumatoid arthritis(714.0) 2000   per patient by labs    Past Surgical History:  Procedure Laterality Date  . ABDOMINAL HYSTERECTOMY     secondary to endometriosis  . ANTERIOR CRUCIATE LIGAMENT REPAIR  age 89   left  . APPENDECTOMY     at age 29  . ARTHROSCOPIC REPAIR ACL  1999   left knee  . CARPAL TUNNEL RELEASE     bilateral surgeries  . CATARACT EXTRACTION  Mar 2013   right eye, [place]  . CYSTECTOMY  1963  . EYE SURGERY  2005   left cataract   . OOPHORECTOMY    . septoplasty  Dec 2006   [name],   . SEPTOPLASTY  2006    Family History  Problem Relation Age of Onset  . Cancer Mother        lung  . Heart disease Father   . Crohn's disease Sister   . Breast cancer Cousin   . Colon cancer Neg  Hx     SOCIAL HX:  reports that she quit smoking about 17 years ago. Her smoking use included cigarettes. She has never used smokeless tobacco. She reports current alcohol use of about 5.0 standard drinks of alcohol per week. She reports that she does not use drugs.   Current Outpatient Medications:  .  albuterol (PROVENTIL) (2.5 MG/3ML) 0.083% nebulizer solution, Take 3 mLs (2.5 mg total) by nebulization every 6 (six) hours as needed for wheezing or shortness of breath., Disp: 75 mL, Rfl: 0 .  ALPRAZolam (XANAX) 0.5 MG tablet, TAKE ONE TABLET AT BED- TIME AS NEEDED., Disp: 30 tablet, Rfl: 5 .  amLODipine (NORVASC) 5 MG tablet, Take 1 tablet (5 mg total) by mouth daily., Disp: 90 tablet, Rfl: 1 .  amphetamine-dextroamphetamine (ADDERALL) 10 MG tablet, Take 1  tablet (10 mg total) by mouth 2 (two) times daily., Disp: 60 tablet, Rfl: 0 .  atorvastatin (LIPITOR) 20 MG tablet, Take 1 tablet (20 mg total) by mouth daily., Disp: 90 tablet, Rfl: 0 .  calcium carbonate (OS-CAL) 600 MG TABS, Take 600 mg by mouth 2 (two) times daily with a meal.  , Disp: , Rfl:  .  cetirizine (ZYRTEC) 10 MG tablet, Take 10 mg by mouth daily., Disp: , Rfl:  .  cholecalciferol (VITAMIN D) 1000 UNITS tablet, Take 1,000 Units by mouth daily.  , Disp: , Rfl:  .  cyclobenzaprine (FLEXERIL) 10 MG tablet, Take 1 tablet (10 mg total) by mouth 3 (three) times daily as needed for muscle spasms., Disp: 20 tablet, Rfl: 0 .  Docusate Calcium (STOOL SOFTENER PO), Take by mouth.  , Disp: , Rfl:  .  doxepin (SINEQUAN) 25 MG capsule, , Disp: , Rfl:  .  fexofenadine (ALLEGRA) 180 MG tablet, Take 1 tablet (180 mg total) by mouth daily., Disp: 90 tablet, Rfl: 1 .  Fluticasone-Salmeterol (ADVAIR DISKUS) 250-50 MCG/DOSE AEPB, INHALE ONE PUFF INTO LUNGS TWICE DAILY, Disp: 60 each, Rfl: 0 .  furosemide (LASIX) 20 MG tablet, Take 1 tablet (20 mg total) by mouth daily as needed., Disp: 30 tablet, Rfl: 0 .  HYDROcodone-acetaminophen (NORCO) 10-325 MG tablet, Take 1 tablet by mouth at bedtime and may repeat dose one time if needed. For severe pain, Disp: 45 tablet, Rfl: 0 .  HYDROcodone-acetaminophen (NORCO) 10-325 MG tablet, TAKE 1 TABLET BY MOUTH AT BEDTIME, MAY REPEAT DOSE 1 TIME IF NEEDED FOR SEVERE PAIN, Disp: 45 tablet, Rfl: 0 .  hydrOXYzine (ATARAX/VISTARIL) 50 MG tablet, Take 1 tablet (50 mg total) by mouth every 6 (six) hours as needed., Disp: 120 tablet, Rfl: 3 .  lidocaine-prilocaine (EMLA) cream, Apply 1 application topically as needed. Lidocaine 2.26%/prilocaine 2.26%/meloxicam 0.0875% cream COMPOUNDED, Disp: 5800 g, Rfl: 3 .  metoprolol succinate (TOPROL-XL) 25 MG 24 hr tablet, Take 1 tablet (25 mg total) by mouth daily., Disp: 90 tablet, Rfl: 3 .  mirtazapine (REMERON) 15 MG tablet, Take 1  tablet (15 mg total) by mouth at bedtime., Disp: 90 tablet, Rfl: 1 .  montelukast (SINGULAIR) 10 MG tablet, TAKE ONE TABLET BY MOUTH AT BEDTIME, Disp: 30 tablet, Rfl: 3 .  PROAIR HFA 108 (90 Base) MCG/ACT inhaler, TAKE TWO PUFFS EVERY 6 HOURS AS NEEDED FOR WHEEZE OR SHORTNESS OF BREATH., Disp: 8.5 g, Rfl: 0 .  silver sulfADIAZINE (SILVADENE) 1 % cream, Apply 1 application topically daily., Disp: 50 g, Rfl: 0 .  traMADol (ULTRAM) 50 MG tablet, Take 2 tablets (100 mg total) by mouth every 6 (six) hours as  needed. May refill on or after  Apr 26 2017, Disp: 240 tablet, Rfl: 5 .  traZODone (DESYREL) 50 MG tablet, Take 0.5-1 tablets (25-50 mg total) by mouth at bedtime., Disp: 90 tablet, Rfl: 0 .  triamcinolone cream (KENALOG) 0.1 %, Apply 1 application topically 2 (two) times daily., Disp: 30 g, Rfl: 2 .  triamcinolone ointment (KENALOG) 0.1 %, , Disp: , Rfl:  .  cephALEXin (KEFLEX) 500 MG capsule, Take 1 capsule (500 mg total) by mouth 4 (four) times daily., Disp: 28 capsule, Rfl: 0 .  hydrOXYzine (ATARAX/VISTARIL) 25 MG tablet, , Disp: , Rfl:  .  montelukast (SINGULAIR) 10 MG tablet, Take 1 tablet (10 mg total) by mouth daily., Disp: 30 tablet, Rfl: 5 .  QUEtiapine (SEROQUEL) 25 MG tablet, Take 1 tablet (25 mg total) by mouth at bedtime., Disp: 90 tablet, Rfl: 1 .  traMADol (ULTRAM) 50 MG tablet, Take 1 tablet (50 mg total) by mouth every 6 (six) hours as needed for up to 7 days for moderate pain., Disp: 30 tablet, Rfl: 0  EXAM:  VITALS per patient if applicable:  GENERAL: alert, oriented, appears well and in no acute distress  HEENT: atraumatic, conjunttiva clear, no obvious abnormalities on inspection of external nose and ears  NECK: normal movements of the head and neck  LUNGS: on inspection no signs of respiratory distress, breathing rate appears normal, no obvious gross SOB, gasping or wheezing  CV: no obvious cyanosis  MS: moves all visible extremities without noticeable  abnormality  PSYCH/NEURO: anxious, but pleasant and cooperative, speech and thought processing grossly intact  ASSESSMENT AND PLAN:  Attention deficit disorder of adult with hyperactivity I have reduced her dose given her unexplained weight loss.   She does appear to have a lack of concentration   Weight loss Ongoing, etiology unclear.  Suspect psychiatric component  , possibly early dementia,  But she has a new onset anemia and needs colonoscopy .  Awaiting GI appt sept 8   Chronic papular urticaria She has seen dermatology for skin biopsies which were negative except for excoriations.  Continue hydroxyzine qid and adding seroquel .  7 days of cephalexin prescribed for early cellulitis   A total of 25 minutes of  Non face to face time was spent with patient more than half of which was spent in counselling about  Her urticarial rash,  Weight loss and anxiety     I discussed the assessment and treatment plan with the patient. The patient was provided an opportunity to ask questions and all were answered. The patient agreed with the plan and demonstrated an understanding of the instructions.   The patient was advised to call back or seek an in-person evaluation if the symptoms worsen or if the condition fails to improve as anticipated.   Crecencio Mc, MD

## 2018-11-16 NOTE — Assessment & Plan Note (Signed)
Ongoing, etiology unclear.  Suspect psychiatric component  , possibly early dementia,  But she has a new onset anemia and needs colonoscopy .  Awaiting GI appt sept 8

## 2018-11-16 NOTE — Assessment & Plan Note (Addendum)
She has seen dermatology for skin biopsies which were negative except for excoriations.  Continue hydroxyzine qid and adding seroquel .  7 days of cephalexin prescribed for early cellulitis

## 2018-11-16 NOTE — Assessment & Plan Note (Signed)
I have reduced her dose given her unexplained weight loss.   She does appear to have a lack of concentration

## 2018-11-21 ENCOUNTER — Other Ambulatory Visit: Payer: Self-pay | Admitting: Internal Medicine

## 2018-11-28 ENCOUNTER — Ambulatory Visit (INDEPENDENT_AMBULATORY_CARE_PROVIDER_SITE_OTHER): Payer: Medicare HMO | Admitting: Internal Medicine

## 2018-11-28 ENCOUNTER — Encounter: Payer: Self-pay | Admitting: Internal Medicine

## 2018-11-28 ENCOUNTER — Other Ambulatory Visit: Payer: Self-pay

## 2018-11-28 DIAGNOSIS — L282 Other prurigo: Secondary | ICD-10-CM | POA: Diagnosis not present

## 2018-11-28 DIAGNOSIS — R634 Abnormal weight loss: Secondary | ICD-10-CM

## 2018-11-28 DIAGNOSIS — L309 Dermatitis, unspecified: Secondary | ICD-10-CM

## 2018-11-28 DIAGNOSIS — L508 Other urticaria: Secondary | ICD-10-CM

## 2018-11-28 DIAGNOSIS — F413 Other mixed anxiety disorders: Secondary | ICD-10-CM

## 2018-11-28 DIAGNOSIS — L299 Pruritus, unspecified: Secondary | ICD-10-CM

## 2018-11-28 MED ORDER — MONTELUKAST SODIUM 10 MG PO TABS: 10.0000 mg | ORAL_TABLET | Freq: Every day | ORAL | 3 refills | Status: AC

## 2018-11-28 NOTE — Progress Notes (Addendum)
Telephone  Note  This visit type was conducted due to national recommendations for restrictions regarding the COVID-19 pandemic (e.g. social distancing).  This format is felt to be most appropriate for this patient at this time.  All issues noted in this document were discussed and addressed.  No physical exam was performed (except for noted visual exam findings with Video Visits).   I connected with@ on 11/28/18 at 10:30 AM EDT by telephone and verified that I am speaking with the correct person using two identifiers. Location patient: home Location provider: work or home office Persons participating in the virtual visit: patient, provider  I discussed the limitations, risks, security and privacy concerns of performing an evaluation and management service by telephone and the availability of in person appointments. I also discussed with the patient that there may be a patient responsible charge related to this service. The patient expressed understanding and agreed to proceed.  I Reason for visit:   Follow up   HPI:  73 yr old with ADD. Chronic pain ,  ongoing weight loss of uncertain etiology presents for follow up on medication therapy  for insomnia and chronic itching..    She states that the seroquel is definitely helping her nocturnal itching. She continues to use hydroxyzine throughout the day . Reviewed the past skin biopsies from Dr Evorn Gong which were unrevealing .   Patient requested advice on how to handle the deteriorating relationship with her daughter.      ROS: See pertinent positives and negatives per HPI.  Past Medical History:  Diagnosis Date  . ADD (attention deficit disorder)   . Allergy    seasonal  . Anemia   . Arthritis   . Blood transfusion without reported diagnosis   . Cataract    surgery to remove them  . Endometriosis of ovary    prior GYN [name]  . 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 Fracture closed, pubis ([place]) 1981  . Fracture of bone of left shoulder 1981  . Fracture of maxilla ([place]) 1981  . Gastritis   . GERD (gastroesophageal reflux disease)   . History of ovarian cyst   . History of pericarditis 1988   occurred after flu shot, with pneumonia  . Hypertension   . Osteoporosis   . Ovarian cyst    right, endometriosis  . Pericarditis    does not receive flu vaccine becuase of this  . Rheumatoid arthritis(714.0) 2000   per patient by labs  . Sleep apnea     Past Surgical History:  Procedure Laterality Date  . ABDOMINAL HYSTERECTOMY     secondary to endometriosis  . ANTERIOR CRUCIATE LIGAMENT REPAIR  age 49   left  . APPENDECTOMY     at age 26  . ARTHROSCOPIC REPAIR ACL  1999   left knee  . CARPAL TUNNEL RELEASE     bilateral surgeries  . CATARACT EXTRACTION  Mar 2013   right eye, [place]  . CYSTECTOMY  1963  . EYE SURGERY  2005   left cataract   . OOPHORECTOMY    . septoplasty  Dec 2006   [name],   . SEPTOPLASTY  2006    Family History  Problem Relation Age of Onset  . Cancer Mother        lung  . Heart disease Father   . Crohn's disease Sister   . Breast cancer Cousin   .  Colon cancer Neg Hx   . Colon polyps Neg Hx   . Esophageal cancer Neg Hx   . Rectal cancer Neg Hx   . Stomach cancer Neg Hx     SOCIAL HX:  reports that she quit smoking about 17 years ago. Her smoking use included cigarettes. She has never used smokeless tobacco. She reports current alcohol use of about 5.0 standard drinks of alcohol per week. She reports that she does not use drugs.   Current Outpatient Medications:  .  albuterol (PROVENTIL) (2.5 MG/3ML) 0.083% nebulizer solution, Take 3 mLs (2.5 mg total) by nebulization every 6 (six) hours as needed for wheezing or shortness of breath., Disp: 75 mL, Rfl: 0 .  ALPRAZolam (XANAX) 0.5 MG tablet, TAKE ONE TABLET AT BED- TIME AS NEEDED., Disp: 30 tablet, Rfl: 5 .  amLODipine (NORVASC) 5 MG tablet, Take  1 tablet (5 mg total) by mouth daily., Disp: 90 tablet, Rfl: 1 .  amphetamine-dextroamphetamine (ADDERALL) 10 MG tablet, Take 1 tablet (10 mg total) by mouth 2 (two) times daily., Disp: 60 tablet, Rfl: 0 .  atorvastatin (LIPITOR) 20 MG tablet, Take 1 tablet (20 mg total) by mouth daily., Disp: 90 tablet, Rfl: 0 .  calcium carbonate (OS-CAL) 600 MG TABS, Take 600 mg by mouth 2 (two) times daily with a meal.  , Disp: , Rfl:  .  cetirizine (ZYRTEC) 10 MG tablet, Take 10 mg by mouth daily., Disp: , Rfl:  .  Docusate Calcium (STOOL SOFTENER PO), Take by mouth.  , Disp: , Rfl:  .  doxepin (SINEQUAN) 25 MG capsule, TAKE 1 CAPSULE BY MOUTH AT BEDTIME, Disp: 30 capsule, Rfl: 2 .  Fluticasone-Salmeterol (ADVAIR DISKUS) 250-50 MCG/DOSE AEPB, INHALE ONE PUFF INTO LUNGS TWICE DAILY, Disp: 60 each, Rfl: 0 .  furosemide (LASIX) 20 MG tablet, Take 1 tablet (20 mg total) by mouth daily as needed., Disp: 30 tablet, Rfl: 0 .  HYDROcodone-acetaminophen (NORCO) 10-325 MG tablet, TAKE 1 TABLET BY MOUTH AT BEDTIME, MAY REPEAT DOSE 1 TIME IF NEEDED FOR SEVERE PAIN, Disp: 45 tablet, Rfl: 0 .  hydrOXYzine (ATARAX/VISTARIL) 50 MG tablet, Take 1 tablet (50 mg total) by mouth every 6 (six) hours as needed., Disp: 120 tablet, Rfl: 3 .  lidocaine-prilocaine (EMLA) cream, Apply 1 application topically as needed. Lidocaine 2.26%/prilocaine 2.26%/meloxicam 0.0875% cream COMPOUNDED (Patient not taking: Reported on 12/17/2018), Disp: 5800 g, Rfl: 3 .  metoprolol succinate (TOPROL-XL) 25 MG 24 hr tablet, Take 1 tablet (25 mg total) by mouth daily., Disp: 90 tablet, Rfl: 3 .  mirtazapine (REMERON) 15 MG tablet, Take 1 tablet (15 mg total) by mouth at bedtime., Disp: 90 tablet, Rfl: 1 .  PROAIR HFA 108 (90 Base) MCG/ACT inhaler, TAKE TWO PUFFS EVERY 6 HOURS AS NEEDED FOR WHEEZE OR SHORTNESS OF BREATH., Disp: 8.5 g, Rfl: 0 .  QUEtiapine (SEROQUEL) 25 MG tablet, Take 1 tablet (25 mg total) by mouth at bedtime., Disp: 90 tablet, Rfl: 1 .   silver sulfADIAZINE (SILVADENE) 1 % cream, Apply 1 application topically daily. (Patient not taking: Reported on 12/17/2018), Disp: 50 g, Rfl: 0 .  traZODone (DESYREL) 50 MG tablet, Take 0.5-1 tablets (25-50 mg total) by mouth at bedtime., Disp: 90 tablet, Rfl: 0 .  triamcinolone cream (KENALOG) 0.1 %, Apply 1 application topically 2 (two) times daily., Disp: 30 g, Rfl: 2 .  cholecalciferol (VITAMIN D) 1000 UNITS tablet, Take 1,000 Units by mouth daily.  , Disp: , Rfl:  .  montelukast (SINGULAIR) 10  MG tablet, Take 1 tablet (10 mg total) by mouth at bedtime., Disp: 30 tablet, Rfl: 3 .  traMADol (ULTRAM) 50 MG tablet, TAKE 1 TABLET BY MOUTH EVERY SIX HOURS AS NEEDED FOR MODERATE PAIN, Disp: 120 tablet, Rfl: 0  EXAM:   General impression: alert, cooperative and articulate.  No signs of being in distress  Lungs: speech is fluent sentence length suggests that patient is not short of breath and not punctuated by cough, sneezing or sniffing. [name] Kitchen   Psych: affect normal.  speech is articulate and non pressured .  Denies suicidal thoughts    ASSESSMENT AND PLAN:  Discussed the following assessment and plan:  Chronic papular urticaria  Weight loss  Other mixed anxiety disorders  Chronic papular urticaria She has had at least two skin biopsies by Dermatologist Dasher which were unrevealing for inflammatory disorders,  And her itching has Improved with addition of seroquel.  Question of home infestation has been raised and patient has several obstacles to determining this  caused by lifestyle .  Weight loss Ongoing, etiology unclear.  Suspect psychiatric component  , possibly early dementia,  But she has a new onset anemia and needs colonoscopy .  Awaiting GI appt sept 8 which she postponed for irrational reasons,  Given her previously stated concern. Advised her strongly to reschedule ASAP   Anxiety disorder Aggravated by deteriorating relationship with one of her daughters. Advice given.     I  discussed the assessment and treatment plan with the patient. The patient was provided an opportunity to ask questions and all were answered. The patient agreed with the plan and demonstrated an understanding of the instructions.   The patient was advised to call back or seek an in-person evaluation if the symptoms worsen or if the condition fails to improve as anticipated.  I provided  22 minutes of non-face-to-face time during this encounter reviewing patient's current problems and post surgeries.  Providing counseling on the above mentioned problems , and coordination  of care .   Crecencio Mc, MD

## 2018-11-30 NOTE — Assessment & Plan Note (Signed)
Ongoing, etiology unclear.  Suspect psychiatric component  , possibly early dementia,  But she has a new onset anemia and needs colonoscopy .  Awaiting GI appt sept 8 which she postponed for irrational reasons,  Given her previously stated concern. Advised her strongly to reschedule ASAP

## 2018-11-30 NOTE — Assessment & Plan Note (Addendum)
She has had at least two skin biopsies by Dermatologist Dasher which were unrevealing for inflammatory disorders,  And her itching has Improved with addition of seroquel.  Question of home infestation has been raised and patient has several obstacles to determining this  caused by lifestyle .

## 2018-12-01 ENCOUNTER — Encounter: Payer: Medicare HMO | Admitting: Internal Medicine

## 2018-12-04 DIAGNOSIS — L298 Other pruritus: Secondary | ICD-10-CM | POA: Diagnosis not present

## 2018-12-10 ENCOUNTER — Other Ambulatory Visit: Payer: Self-pay | Admitting: Internal Medicine

## 2018-12-11 ENCOUNTER — Encounter: Payer: Medicare HMO | Admitting: Internal Medicine

## 2018-12-11 NOTE — Telephone Encounter (Signed)
Spoke with pt and read her the mychart message because her computer is missed up and doesn't get messages right now.

## 2018-12-17 ENCOUNTER — Other Ambulatory Visit: Payer: Self-pay

## 2018-12-17 ENCOUNTER — Ambulatory Visit (AMBULATORY_SURGERY_CENTER): Payer: Self-pay | Admitting: *Deleted

## 2018-12-17 VITALS — Temp 97.8°F | Ht 63.0 in | Wt 101.6 lb

## 2018-12-17 DIAGNOSIS — Z1211 Encounter for screening for malignant neoplasm of colon: Secondary | ICD-10-CM

## 2018-12-17 MED ORDER — NA SULFATE-K SULFATE-MG SULF 17.5-3.13-1.6 GM/177ML PO SOLN: 1.0000 | Freq: Once | ORAL | 0 refills | Status: AC

## 2018-12-17 NOTE — Progress Notes (Signed)

## 2018-12-18 ENCOUNTER — Encounter: Payer: Self-pay | Admitting: Internal Medicine

## 2018-12-25 ENCOUNTER — Encounter: Payer: Self-pay | Admitting: *Deleted

## 2018-12-27 DIAGNOSIS — L309 Dermatitis, unspecified: Secondary | ICD-10-CM | POA: Diagnosis not present

## 2018-12-29 ENCOUNTER — Other Ambulatory Visit: Payer: Self-pay

## 2018-12-29 ENCOUNTER — Ambulatory Visit: Payer: Medicare HMO | Admitting: Family Medicine

## 2018-12-29 ENCOUNTER — Encounter: Payer: Self-pay | Admitting: Internal Medicine

## 2018-12-29 ENCOUNTER — Ambulatory Visit (INDEPENDENT_AMBULATORY_CARE_PROVIDER_SITE_OTHER): Payer: Medicare HMO | Admitting: Internal Medicine

## 2018-12-29 DIAGNOSIS — D126 Benign neoplasm of colon, unspecified: Secondary | ICD-10-CM

## 2018-12-29 DIAGNOSIS — R634 Abnormal weight loss: Secondary | ICD-10-CM | POA: Diagnosis not present

## 2018-12-29 DIAGNOSIS — L03114 Cellulitis of left upper limb: Secondary | ICD-10-CM | POA: Diagnosis not present

## 2018-12-29 MED ORDER — QUETIAPINE FUMARATE 50 MG PO TABS: 50.0000 mg | ORAL_TABLET | Freq: Every day | ORAL | 1 refills | Status: DC

## 2018-12-29 MED ORDER — ALPRAZOLAM 0.5 MG PO TABS: ORAL_TABLET | ORAL | 5 refills | Status: AC

## 2018-12-29 NOTE — Assessment & Plan Note (Signed)
Colonoscopy is scheduled for Oct  21

## 2018-12-29 NOTE — Assessment & Plan Note (Signed)
Aggravated by deteriorating relationship with one of her daughters. Advice given.

## 2018-12-29 NOTE — Assessment & Plan Note (Signed)
Cephalexin prescrived today for 7 dasy.  Likely started by neurotic itching vs insect bite. Continue  hydroxyzine and seroquel for itching

## 2018-12-29 NOTE — Progress Notes (Signed)
Telephone Note  This visit type was conducted due to national recommendations for restrictions regarding the COVID-19 pandemic (e.g. social distancing).  This format is felt to be most appropriate for this patient at this time.  All issues noted in this document were discussed and addressed.  No physical exam was performed (except for noted visual exam findings with Video Visits).   I connected with@ on 12/29/18 at  9:00 AM EDT by telephone and verified that I am speaking with the correct person using two identifiers. Location patient: home Location provider: work or home office Persons participating in the virtual visit: patient, provider  I discussed the limitations, risks, security and privacy concerns of performing an evaluation and management service by telephone and the availability of in person appointments. I also discussed with the patient that there may be a patient responsible charge related to this service. The patient expressed understanding and agreed to proceed.  Reason for visit: one month follow up on chronic urticaria and unintentional weight loss   HPI:  73 yr old female with GAD,  Chronic neurotic urticaria and  history of weight loss of undetermined etiology presents for one month follow up.  Has been treating urticaria with seroquel and hydroxyzine  With god results until last week when she developed 2 intensely prurutic papules on left arm that became bloody.  Within 2 days entire arm has become enflamed.   Has not seen any insects in house,  Told she has dust mites by exterminator   GAD:  Sleeping better with seroquel and alprazolam.   Weight loss:  Has gained 6 lbs since reaching nadir of 99 lbs.  colonoscopy scheduled for  oct 21 in [place]: See pertinent positives and negatives per HPI.  Past Medical History:  Diagnosis Date  . ADD (attention deficit disorder)   . Allergy    seasonal  . Anemia   . Arthritis   . Blood transfusion without reported  diagnosis   . Cataract    surgery to remove them  . Endometriosis of ovary    prior GYN [name]  . 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 Fracture closed, pubis ([place]) 1981  . Fracture of bone of left shoulder 1981  . Fracture of maxilla ([place]) 1981  . Gastritis   . GERD (gastroesophageal reflux disease)   . History of ovarian cyst   . History of pericarditis 1988   occurred after flu shot, with pneumonia  . Hypertension   . Osteoporosis   . Ovarian cyst    right, endometriosis  . Pericarditis    does not receive flu vaccine becuase of this  . Rheumatoid arthritis(714.0) 2000   per patient by labs  . Sleep apnea     Past Surgical History:  Procedure Laterality Date  . ABDOMINAL HYSTERECTOMY     secondary to endometriosis  . ANTERIOR CRUCIATE LIGAMENT REPAIR  age 82   left  . APPENDECTOMY     at age 48  . ARTHROSCOPIC REPAIR ACL  1999   left knee  . CARPAL TUNNEL RELEASE     bilateral surgeries  . CATARACT EXTRACTION  Mar 2013   right eye, [place]  . CYSTECTOMY  1963  . EYE SURGERY  2005   left cataract   . OOPHORECTOMY    . septoplasty  Dec 2006   [name],   . SEPTOPLASTY  2006  Family History  Problem Relation Age of Onset  . Cancer Mother        lung  . Heart disease Father   . Crohn's disease Sister   . Breast cancer Cousin   . Colon cancer Neg Hx   . Colon polyps Neg Hx   . Esophageal cancer Neg Hx   . Rectal cancer Neg Hx   . Stomach cancer Neg Hx     SOCIAL HX:  reports that she quit smoking about 17 years ago. Her smoking use included cigarettes. She has never used smokeless tobacco. She reports current alcohol use of about 5.0 standard drinks of alcohol per week. She reports that she does not use drugs.   Current Outpatient Medications:  .  albuterol (PROVENTIL) (2.5 MG/3ML) 0.083% nebulizer solution, Take 3 mLs (2.5 mg total) by nebulization every 6 (six) hours as  needed for wheezing or shortness of breath., Disp: 75 mL, Rfl: 0 .  ALPRAZolam (XANAX) 0.5 MG tablet, TAKE ONE TABLET AT BED- TIME AS NEEDED., Disp: 30 tablet, Rfl: 5 .  amLODipine (NORVASC) 5 MG tablet, Take 1 tablet (5 mg total) by mouth daily., Disp: 90 tablet, Rfl: 1 .  amphetamine-dextroamphetamine (ADDERALL) 10 MG tablet, Take 1 tablet (10 mg total) by mouth 2 (two) times daily., Disp: 60 tablet, Rfl: 0 .  atorvastatin (LIPITOR) 20 MG tablet, Take 1 tablet (20 mg total) by mouth daily., Disp: 90 tablet, Rfl: 0 .  calcium carbonate (OS-CAL) 600 MG TABS, Take 600 mg by mouth 2 (two) times daily with a meal.  , Disp: , Rfl:  .  cetirizine (ZYRTEC) 10 MG tablet, Take 10 mg by mouth daily., Disp: , Rfl:  .  cholecalciferol (VITAMIN D) 1000 UNITS tablet, Take 1,000 Units by mouth daily.  , Disp: , Rfl:  .  Docusate Calcium (STOOL SOFTENER PO), Take by mouth.  , Disp: , Rfl:  .  doxepin (SINEQUAN) 25 MG capsule, TAKE 1 CAPSULE BY MOUTH AT BEDTIME, Disp: 30 capsule, Rfl: 2 .  Fluticasone-Salmeterol (ADVAIR DISKUS) 250-50 MCG/DOSE AEPB, INHALE ONE PUFF INTO LUNGS TWICE DAILY, Disp: 60 each, Rfl: 0 .  furosemide (LASIX) 20 MG tablet, Take 1 tablet (20 mg total) by mouth daily as needed., Disp: 30 tablet, Rfl: 0 .  HYDROcodone-acetaminophen (NORCO) 10-325 MG tablet, TAKE 1 TABLET BY MOUTH AT BEDTIME, MAY REPEAT DOSE 1 TIME IF NEEDED FOR SEVERE PAIN, Disp: 45 tablet, Rfl: 0 .  hydrOXYzine (ATARAX/VISTARIL) 50 MG tablet, Take 1 tablet (50 mg total) by mouth every 6 (six) hours as needed., Disp: 120 tablet, Rfl: 3 .  metoprolol succinate (TOPROL-XL) 25 MG 24 hr tablet, Take 1 tablet (25 mg total) by mouth daily., Disp: 90 tablet, Rfl: 3 .  mirtazapine (REMERON) 15 MG tablet, Take 1 tablet (15 mg total) by mouth at bedtime., Disp: 90 tablet, Rfl: 1 .  montelukast (SINGULAIR) 10 MG tablet, Take 1 tablet (10 mg total) by mouth at bedtime., Disp: 30 tablet, Rfl: 3 .  PROAIR HFA 108 (90 Base) MCG/ACT inhaler,  TAKE TWO PUFFS EVERY 6 HOURS AS NEEDED FOR WHEEZE OR SHORTNESS OF BREATH., Disp: 8.5 g, Rfl: 0 .  QUEtiapine (SEROQUEL) 50 MG tablet, Take 1 tablet (50 mg total) by mouth at bedtime., Disp: 90 tablet, Rfl: 1 .  traMADol (ULTRAM) 50 MG tablet, TAKE 1 TABLET BY MOUTH EVERY SIX HOURS AS NEEDED FOR MODERATE PAIN, Disp: 120 tablet, Rfl: 0 .  traZODone (DESYREL) 50 MG tablet, Take 0.5-1 tablets (25-50 mg  total) by mouth at bedtime., Disp: 90 tablet, Rfl: 0 .  triamcinolone cream (KENALOG) 0.1 %, Apply 1 application topically 2 (two) times daily., Disp: 30 g, Rfl: 2 .  lidocaine-prilocaine (EMLA) cream, Apply 1 application topically as needed. Lidocaine 2.26%/prilocaine 2.26%/meloxicam 0.0875% cream COMPOUNDED (Patient not taking: Reported on 12/17/2018), Disp: 5800 g, Rfl: 3 .  silver sulfADIAZINE (SILVADENE) 1 % cream, Apply 1 application topically daily. (Patient not taking: Reported on 12/17/2018), Disp: 50 g, Rfl: 0  EXAM:  VITALS per patient if applicable:   General impression: alert, cooperative and articulate.  No signs of being in distress  Lungs: speech is fluent sentence length suggests that patient is not short of breath and not punctuated by cough, sneezing or sniffing. [name] Kitchen   Psych: affect normal.  speech is articulate and non pressured .  Denies suicidal thoughts   ASSESSMENT AND PLAN:  Discussed the following assessment and plan:  Weight loss  Cellulitis of left arm  Tubular adenoma of colon  Weight loss Weight has stabilized after a regain of 6 lbs.  continue seroquel   Cellulitis of left arm Cephalexin prescrived today for 7 dasy.  Likely started by neurotic itching vs insect bite. Continue  hydroxyzine and seroquel for itching   Tubular adenoma of colon Colonoscopy is scheduled for Oct  21    I discussed the assessment and treatment plan with the patient. The patient was provided an opportunity to ask questions and all were answered. The patient agreed with the plan and  demonstrated an understanding of the instructions.   The patient was advised to call back or seek an in-person evaluation if the symptoms worsen or if the condition fails to improve as anticipated.  I provided  25 minutes of non-face-to-face time during this encounter reviewing patient's current problems , recent procedures, and Providing counseling on the above mentioned problems , and coordination  of care .  Crecencio Mc, MD

## 2018-12-29 NOTE — Assessment & Plan Note (Addendum)
Weight has stabilized after a regain of 6 lbs.  continue seroquel

## 2018-12-30 ENCOUNTER — Telehealth: Payer: Self-pay

## 2018-12-30 NOTE — Telephone Encounter (Signed)
Covid-19 screening questions   Do you now or have you had a fever in the last 14 days?  Do you have any respiratory symptoms of shortness of breath or cough now or in the last 14 days?  Do you have any family members or close contacts with diagnosed or suspected Covid-19 in the past 14 days?  Have you been tested for Covid-19 and found to be positive?       

## 2018-12-31 ENCOUNTER — Ambulatory Visit (AMBULATORY_SURGERY_CENTER): Payer: Medicare HMO | Admitting: Internal Medicine

## 2018-12-31 ENCOUNTER — Other Ambulatory Visit: Payer: Self-pay

## 2018-12-31 ENCOUNTER — Encounter: Payer: Self-pay | Admitting: Internal Medicine

## 2018-12-31 VITALS — BP 152/66 | HR 71 | Temp 99.3°F | Resp 20 | Ht 63.0 in | Wt 101.6 lb

## 2018-12-31 DIAGNOSIS — Z1211 Encounter for screening for malignant neoplasm of colon: Secondary | ICD-10-CM

## 2018-12-31 MED ORDER — SODIUM CHLORIDE 0.9 % IV SOLN: 500.0000 mL | Freq: Once | INTRAVENOUS | Status: DC

## 2018-12-31 NOTE — Patient Instructions (Signed)
Dr. Garth Schlatter office will call you to schedule CT of lungs.   YOU HAD AN ENDOSCOPIC PROCEDURE TODAY AT [facility]:   Refer to the procedure report that was given to you for any specific questions about what was found during the examination.  If the procedure report does not answer your questions, please call your gastroenterologist to clarify.  If you requested that your care partner not be given the details of your procedure findings, then the procedure report has been included in a sealed envelope for you to review at your convenience later.  YOU SHOULD EXPECT: Some feelings of bloating in the abdomen. Passage of more gas than usual.  Walking can help get rid of the air that was put into your GI tract during the procedure and reduce the bloating. If you had a lower endoscopy (such as a colonoscopy or flexible sigmoidoscopy) you may notice spotting of blood in your stool or on the toilet paper. If you underwent a bowel prep for your procedure, you may not have a normal bowel movement for a few days.  Please Note:  You might notice some irritation and congestion in your nose or some drainage.  This is from the oxygen used during your procedure.  There is no need for concern and it should clear up in a day or so.  SYMPTOMS TO REPORT IMMEDIATELY:   Following lower endoscopy (colonoscopy or flexible sigmoidoscopy):  Excessive amounts of blood in the stool  Significant tenderness or worsening of abdominal pains  Swelling of the abdomen that is new, acute  Fever of 100F or higher   For urgent or emergent issues, a gastroenterologist can be reached at any hour by calling [phone].   DIET:  We do recommend a small meal at first, but then you may proceed to your regular diet.  Drink plenty of fluids but you should avoid alcoholic beverages for 24 hours.  ACTIVITY:  You should plan to take it easy for the rest of today and you should NOT DRIVE or use heavy machinery until  tomorrow (because of the sedation medicines used during the test).    FOLLOW UP: Our staff will call the number listed on your records 48-72 hours following your procedure to check on you and address any questions or concerns that you may have regarding the information given to you following your procedure. If we do not reach you, we will leave a message.  We will attempt to reach you two times.  During this call, we will ask if you have developed any symptoms of COVID 19. If you develop any symptoms (ie: fever, flu-like symptoms, shortness of breath, cough etc.) before then, please call [phone].  If you test positive for Covid 19 in the 2 weeks post procedure, please call and report this information to [place].    If any biopsies were taken you will be contacted by phone or by letter within the next 1-3 weeks.  Please call us at [phone] if you have not heard about the biopsies in 3 weeks.    SIGNATURES/CONFIDENTIALITY: You and/or your care partner have signed paperwork which will be entered into your electronic medical record.  These signatures attest to the fact that that the information above on your After Visit Summary has been reviewed and is understood.  Full responsibility of the confidentiality of this discharge information lies with you and/or your care-partner.

## 2018-12-31 NOTE — Progress Notes (Signed)
To PACU, VSS. Report to [email or web address] 

## 2018-12-31 NOTE — Progress Notes (Signed)
Temperature taken by K.A., VS taken by C.W. 

## 2018-12-31 NOTE — Progress Notes (Signed)
Pt's states no medical or surgical changes since previsit or office visit. 

## 2018-12-31 NOTE — Op Note (Signed)
[facility] Patient Name: Sonya Davis Procedure Date: 12/31/2018 2:09 PM MRN: [id number] Endoscopist: Jerene Bears , MD Age: 73 Referring MD:  Date of Birth: March 19, 1945 Gender: Female Account #: [id number] Procedure:                Colonoscopy Indications:              Screening for colorectal malignant neoplasm, last                            colonoscopy Dec 2016 performed for positive                            Cologuard (hyperplastic polyps removed, no                            adenomas). Medicines:                Monitored Anesthesia Care Procedure:                Pre-Anesthesia Assessment:                           - Prior to the procedure, a History and Physical                            was performed, and patient medications and                            allergies were reviewed. The patient's tolerance of                            previous anesthesia was also reviewed. The risks                            and benefits of the procedure and the sedation                            options and risks were discussed with the patient.                            All questions were answered, and informed consent                            was obtained. Prior Anticoagulants: The patient has                            taken no previous anticoagulant or antiplatelet                            agents. ASA Grade Assessment: II - A patient with                            mild systemic disease. After reviewing the risks  and benefits, the patient was deemed in                            satisfactory condition to undergo the procedure.                           After obtaining informed consent, the colonoscope                            was passed under direct vision. Throughout the                            procedure, the patient's blood pressure, pulse, and                            oxygen saturations were monitored continuously. The                 Colonoscope was introduced through the anus and                            advanced to the cecum, identified by appendiceal                            orifice and ileocecal valve. The colonoscopy was                            performed without difficulty. The patient tolerated                            the procedure well. The quality of the bowel                            preparation was good. The ileocecal valve,                            appendiceal orifice, and rectum were photographed. Scope In: 2:20:14 PM Scope Out: 2:37:40 PM Scope Withdrawal Time: 0 hours 11 minutes 4 seconds  Total Procedure Duration: 0 hours 17 minutes 26 seconds  Findings:                 The digital rectal exam was normal.                           The entire examined colon appeared normal on direct                            and retroflexion views. Complications:            No immediate complications. Estimated Blood Loss:     Estimated blood loss: none. Impression:               - The entire examined colon is normal on direct and                            retroflexion views.                           -  No specimens collected. Recommendation:           - Patient has a contact number available for                            emergencies. The signs and symptoms of potential                            delayed complications were discussed with the                            patient. Return to normal activities tomorrow.                            Written discharge instructions were provided to the                            patient.                           - Resume previous diet.                           - Continue present medications.                           - Low-dose CT scan chest is recommended given                            smoking history and weight loss. If lung CT                            negative and weight loss persists then CT scan                            abd/pelvis  recommended.                           - No recommendation at this time regarding repeat                            colonoscopy due to age at next screening interval                            and no polyps on today's exam. Jerene Bears, MD 12/31/2018 2:46:46 PM This report has been signed electronically.

## 2019-01-01 ENCOUNTER — Telehealth: Payer: Self-pay

## 2019-01-01 ENCOUNTER — Other Ambulatory Visit: Payer: Self-pay

## 2019-01-01 DIAGNOSIS — R634 Abnormal weight loss: Secondary | ICD-10-CM

## 2019-01-01 NOTE — Telephone Encounter (Signed)
Copied from [place] [id number]. Topic: General - Other >> Jan 01, 2019 12:12 PM Alanda Slim E wrote: Reason for CRM: Dr Germain Osgood at [facility] wanted to order a chest xray/scan and said Pt needed a blood test called BMET/ Pt wants to know if she can have the blood work done at the office / Pt asked to speak with nurse / please advise

## 2019-01-01 NOTE — Telephone Encounter (Signed)
Is it okay for pt to have the blood work drawn here in our office?

## 2019-01-01 NOTE — Telephone Encounter (Signed)
Yes. BMET and CXR oredered

## 2019-01-01 NOTE — Telephone Encounter (Signed)
Pt scheduled for CT of chest at the out pt imaging center in [place] at [address] at the corner of [address] and [address]. CT on 01/09/19 at 10am, pt to arrive there at 9:45am and be NPO after 6am. Pt wants to have BMET drawn at her PCP office and will contact that office for thel lab. Pt aware of appt.

## 2019-01-02 ENCOUNTER — Other Ambulatory Visit: Payer: Self-pay | Admitting: Internal Medicine

## 2019-01-02 ENCOUNTER — Telehealth: Payer: Self-pay | Admitting: *Deleted

## 2019-01-02 NOTE — Telephone Encounter (Signed)
  Follow up Call-  Call back number 12/31/2018  Post procedure Call Back phone  # [phone]  Permission to leave phone message Yes  Some recent data might be hidden     Patient questions:  Message left to call us if necessary.

## 2019-01-02 NOTE — Telephone Encounter (Signed)
  Follow up Call-  Call back number 12/31/2018  Post procedure Call Back phone  # [phone]  Permission to leave phone message Yes  Some recent data might be hidden     Patient questions:  Do you have a fever, pain , or abdominal swelling? No. Pain Score  0 *  Have you tolerated food without any problems? Yes.    Have you been able to return to your normal activities? Yes.    Do you have any questions about your discharge instructions: Diet   No. Medications  No. Follow up visit  No.  Do you have questions or concerns about your Care? No.  Actions: * If pain score is 4 or above: No action needed, pain <4.  1. Have you developed a fever since your procedure? no  2.   Have you had an respiratory symptoms (SOB or cough) since your procedure? no  3.   Have you tested positive for COVID 19 since your procedure no  4.   Have you had any family members/close contacts diagnosed with the COVID 19 since your procedure?  no   If yes to any of these questions please route to Joylene John, RN and Alphonsa Gin, Therapist, sports.

## 2019-01-05 NOTE — Telephone Encounter (Signed)
Attempted to call pt to schedule a non fasting lab appt before 01/09/2019 when she has her CT scan done.

## 2019-01-05 NOTE — Telephone Encounter (Signed)
Refilled: 10/15/2018 Last OV: 12/29/2018 Next OV: not scheduled

## 2019-01-06 NOTE — Telephone Encounter (Signed)
Lab appt has been scheduled and pt is aware.

## 2019-01-06 NOTE — Telephone Encounter (Signed)
Dr Germain Osgood at [facility] wanted to order a chest xray/scan and said Pt needed a blood test called BMET/ Pt wants to know if she can have the blood work done at the office / Pt asked to speak with nurse / please advise   [id number] [phone] Patient can reached at either number

## 2019-01-06 NOTE — Telephone Encounter (Signed)
Patient refused Prolia for now.

## 2019-01-07 ENCOUNTER — Other Ambulatory Visit (INDEPENDENT_AMBULATORY_CARE_PROVIDER_SITE_OTHER): Payer: Medicare HMO

## 2019-01-07 ENCOUNTER — Other Ambulatory Visit: Payer: Self-pay

## 2019-01-07 DIAGNOSIS — R634 Abnormal weight loss: Secondary | ICD-10-CM

## 2019-01-07 DIAGNOSIS — H43811 Vitreous degeneration, right eye: Secondary | ICD-10-CM | POA: Diagnosis not present

## 2019-01-08 LAB — BASIC METABOLIC PANEL
BUN: 19 mg/dL (ref 6–23)
CO2: 26 mEq/L (ref 19–32)
Calcium: 9.5 mg/dL (ref 8.4–10.5)
Chloride: 105 mEq/L (ref 96–112)
Creatinine, Ser: 0.91 mg/dL (ref 0.40–1.20)
GFR: 60.52 mL/min (ref >60.00)
Glucose, Bld: 106 mg/dL — ABNORMAL HIGH (ref 70–99)
Potassium: 3.8 mEq/L (ref 3.5–5.1)
Sodium: 139 mEq/L (ref 135–145)

## 2019-01-09 ENCOUNTER — Ambulatory Visit: Admission: RE | Admit: 2019-01-09 | Payer: Medicare HMO | Source: Ambulatory Visit

## 2019-01-12 ENCOUNTER — Telehealth: Payer: Self-pay | Admitting: Internal Medicine

## 2019-01-12 ENCOUNTER — Other Ambulatory Visit: Payer: Self-pay

## 2019-01-12 DIAGNOSIS — R634 Abnormal weight loss: Secondary | ICD-10-CM

## 2019-01-12 NOTE — Telephone Encounter (Signed)
CT chest has been denied (please document when she quit smoking, I thought smoking hx was more recent) Please proceed with CXR 2 v, weight loss, hx of tobacco use

## 2019-01-12 NOTE — Telephone Encounter (Signed)
Order in epic for 2 view chest xray, pt will go to [facility] outpt imaging center M-F between 8am-5pm, no appt required. Pt states she stopped smoking over 15 years ago, she did not have an exact date.

## 2019-01-12 NOTE — Telephone Encounter (Signed)
[employer] called today saying CT has been denied at this time.  Guidelines say for smoking history, can check CT up to 15 years after quiting.  She has had 17 years since quiting.  As for as weight loss, there are no chest xrays to indicate that there is a current issue that requires further imaging.  No options for peer to peer.

## 2019-01-14 ENCOUNTER — Other Ambulatory Visit: Payer: Self-pay | Admitting: Internal Medicine

## 2019-01-15 NOTE — Telephone Encounter (Signed)
Pt would like a call back to discuss plan of care since CT was denied by insurance.

## 2019-01-15 NOTE — Telephone Encounter (Signed)
Spoke with pt and let her know that she is to have the chest xray first to see if it shows anything as her insurance did not approve the CT scan. Pt states she misunderstood our previous conversation and she will go for the chest xray.

## 2019-01-16 ENCOUNTER — Ambulatory Visit: Admission: RE | Admit: 2019-01-16 | Payer: Medicare HMO | Source: Ambulatory Visit

## 2019-03-02 ENCOUNTER — Other Ambulatory Visit: Payer: Self-pay | Admitting: Internal Medicine

## 2019-03-02 NOTE — Telephone Encounter (Signed)
Hydrocodone   Refilled: 01/15/2019 Adderall   Refilled: 01/06/2019  Last OV: 12/29/2018 Next OV: not scheduled

## 2019-03-18 ENCOUNTER — Telehealth: Payer: Self-pay | Admitting: Internal Medicine

## 2019-03-18 ENCOUNTER — Other Ambulatory Visit: Payer: Self-pay | Admitting: Internal Medicine

## 2019-03-18 DIAGNOSIS — L299 Pruritus, unspecified: Secondary | ICD-10-CM

## 2019-03-18 DIAGNOSIS — L309 Dermatitis, unspecified: Secondary | ICD-10-CM

## 2019-03-18 MED ORDER — QUETIAPINE FUMARATE 50 MG PO TABS: 50.0000 mg | ORAL_TABLET | Freq: Every day | ORAL | 1 refills | Status: AC

## 2019-03-18 MED ORDER — HYDROXYZINE HCL 50 MG PO TABS: 50.0000 mg | ORAL_TABLET | Freq: Four times a day (QID) | ORAL | 3 refills | Status: AC | PRN

## 2019-03-18 NOTE — Telephone Encounter (Signed)
Find out what she is taking specifically hydroxyzine and seroquel and what ointment she is using if any

## 2019-03-18 NOTE — Telephone Encounter (Signed)
Spoke with pt and she stated that she had been taking hydroxyzine 50 mg during the day and would then take the 25 mg at bedtime. Pt stated that she is out of the 50 mg. Pt stated that she is out of the seroquel since this weekend she just has not called the pharmacy to get it refilled. She stated the is using OTC zinc-oxide cream. The terrible itching started Monday night.

## 2019-03-18 NOTE — Telephone Encounter (Signed)
Patient aware and will pick up medication and restart.

## 2019-03-18 NOTE — Telephone Encounter (Signed)
Pt's has extreme itching on your left arm. Current medication is not helping itch and needs something stronger. No appts at this time are available for this week with any provider.

## 2019-03-18 NOTE — Telephone Encounter (Signed)
So er symptoms coincided with both meds running out.  So she needs to resume both medications!!   [facility] .   She can add benadryl cream for the itching,  Or calamine lotion

## 2019-03-20 NOTE — Telephone Encounter (Signed)
Pt was advised that she would need to go to urgent care. Pt gave a verbal understanding.

## 2019-03-20 NOTE — Telephone Encounter (Signed)
Pt has called back now with cellulitis setting on left arm. Now spread up to her neck. Pt has used both medications that was prescribed. Please advise and Thank you!  Call pt @ [phone].

## 2019-04-07 ENCOUNTER — Other Ambulatory Visit: Payer: Self-pay | Admitting: Internal Medicine

## 2019-04-09 ENCOUNTER — Other Ambulatory Visit: Payer: Self-pay

## 2019-04-09 MED ORDER — FUROSEMIDE 20 MG PO TABS: 20.0000 mg | ORAL_TABLET | Freq: Every day | ORAL | 0 refills | Status: AC | PRN

## 2019-04-16 ENCOUNTER — Other Ambulatory Visit: Payer: Self-pay | Admitting: Internal Medicine

## 2019-04-20 DIAGNOSIS — L298 Other pruritus: Secondary | ICD-10-CM | POA: Diagnosis not present

## 2019-05-09 DIAGNOSIS — M533 Sacrococcygeal disorders, not elsewhere classified: Secondary | ICD-10-CM | POA: Diagnosis not present

## 2019-05-09 DIAGNOSIS — R4182 Altered mental status, unspecified: Secondary | ICD-10-CM | POA: Diagnosis not present

## 2019-05-09 DIAGNOSIS — R634 Abnormal weight loss: Secondary | ICD-10-CM | POA: Diagnosis not present

## 2019-05-09 DIAGNOSIS — I10 Essential (primary) hypertension: Secondary | ICD-10-CM | POA: Diagnosis not present

## 2019-05-09 DIAGNOSIS — M545 Low back pain: Secondary | ICD-10-CM | POA: Diagnosis not present

## 2019-05-09 DIAGNOSIS — F419 Anxiety disorder, unspecified: Secondary | ICD-10-CM | POA: Diagnosis not present

## 2019-05-09 DIAGNOSIS — M5136 Other intervertebral disc degeneration, lumbar region: Secondary | ICD-10-CM | POA: Diagnosis not present

## 2019-05-09 DIAGNOSIS — M4316 Spondylolisthesis, lumbar region: Secondary | ICD-10-CM | POA: Diagnosis not present

## 2019-05-09 DIAGNOSIS — F17210 Nicotine dependence, cigarettes, uncomplicated: Secondary | ICD-10-CM | POA: Diagnosis not present

## 2019-05-09 DIAGNOSIS — R0602 Shortness of breath: Secondary | ICD-10-CM | POA: Diagnosis not present

## 2019-05-09 DIAGNOSIS — Z79899 Other long term (current) drug therapy: Secondary | ICD-10-CM | POA: Diagnosis not present

## 2019-07-15 ENCOUNTER — Other Ambulatory Visit: Payer: Self-pay | Admitting: Internal Medicine

## 2019-07-15 NOTE — Telephone Encounter (Signed)
Refill request for Norco, last seen 12-29-18, last filled 04-18-19.  Please advise.

## 2019-07-16 NOTE — Telephone Encounter (Signed)
Denied  Must be seen since last appt was October

## 2019-11-09 ENCOUNTER — Ambulatory Visit: Payer: Medicare HMO

## 2019-12-26 DIAGNOSIS — R1013 Epigastric pain: Secondary | ICD-10-CM | POA: Diagnosis not present

## 2019-12-26 DIAGNOSIS — J449 Chronic obstructive pulmonary disease, unspecified: Secondary | ICD-10-CM | POA: Diagnosis not present

## 2019-12-26 DIAGNOSIS — M546 Pain in thoracic spine: Secondary | ICD-10-CM | POA: Diagnosis not present

## 2019-12-26 DIAGNOSIS — M810 Age-related osteoporosis without current pathological fracture: Secondary | ICD-10-CM | POA: Diagnosis not present

## 2019-12-26 DIAGNOSIS — F17210 Nicotine dependence, cigarettes, uncomplicated: Secondary | ICD-10-CM | POA: Diagnosis not present

## 2019-12-26 DIAGNOSIS — Z602 Problems related to living alone: Secondary | ICD-10-CM | POA: Diagnosis not present

## 2019-12-26 DIAGNOSIS — E876 Hypokalemia: Secondary | ICD-10-CM | POA: Diagnosis not present

## 2019-12-26 DIAGNOSIS — I10 Essential (primary) hypertension: Secondary | ICD-10-CM | POA: Diagnosis not present

## 2019-12-26 DIAGNOSIS — E785 Hyperlipidemia, unspecified: Secondary | ICD-10-CM | POA: Diagnosis not present

## 2019-12-26 DIAGNOSIS — R634 Abnormal weight loss: Secondary | ICD-10-CM | POA: Diagnosis not present

## 2019-12-26 DIAGNOSIS — G4733 Obstructive sleep apnea (adult) (pediatric): Secondary | ICD-10-CM | POA: Diagnosis not present

## 2019-12-26 DIAGNOSIS — G8929 Other chronic pain: Secondary | ICD-10-CM | POA: Diagnosis not present

## 2019-12-26 DIAGNOSIS — S299XXA Unspecified injury of thorax, initial encounter: Secondary | ICD-10-CM | POA: Diagnosis not present

## 2019-12-26 DIAGNOSIS — M549 Dorsalgia, unspecified: Secondary | ICD-10-CM | POA: Diagnosis not present

## 2019-12-26 DIAGNOSIS — M47814 Spondylosis without myelopathy or radiculopathy, thoracic region: Secondary | ICD-10-CM | POA: Diagnosis not present

## 2019-12-27 DIAGNOSIS — S299XXA Unspecified injury of thorax, initial encounter: Secondary | ICD-10-CM | POA: Diagnosis not present

## 2019-12-27 DIAGNOSIS — M47814 Spondylosis without myelopathy or radiculopathy, thoracic region: Secondary | ICD-10-CM | POA: Diagnosis not present

## 2019-12-27 DIAGNOSIS — R634 Abnormal weight loss: Secondary | ICD-10-CM | POA: Diagnosis not present

## 2020-01-05 DIAGNOSIS — R413 Other amnesia: Secondary | ICD-10-CM | POA: Diagnosis not present

## 2020-01-05 DIAGNOSIS — L282 Other prurigo: Secondary | ICD-10-CM | POA: Diagnosis not present

## 2020-01-05 DIAGNOSIS — G4733 Obstructive sleep apnea (adult) (pediatric): Secondary | ICD-10-CM | POA: Diagnosis not present

## 2020-01-05 DIAGNOSIS — J449 Chronic obstructive pulmonary disease, unspecified: Secondary | ICD-10-CM | POA: Diagnosis not present

## 2022-02-20 NOTE — Telephone Encounter (Signed)
MyChart messgae sent to patient. 

## 2022-02-21 NOTE — Telephone Encounter (Signed)
Labs D/C'd.
